# Patient Record
Sex: Female | Born: 1985 | Race: White | Hispanic: Yes | Smoking: Never smoker
Health system: Southern US, Community
[De-identification: ages and names within clinical notes are randomized; demographics above are authoritative.]

## PROBLEM LIST (undated history)

## (undated) DIAGNOSIS — N946 Dysmenorrhea, unspecified: Secondary | ICD-10-CM

## (undated) DIAGNOSIS — K222 Esophageal obstruction: Secondary | ICD-10-CM

## (undated) DIAGNOSIS — F419 Anxiety disorder, unspecified: Secondary | ICD-10-CM

## (undated) DIAGNOSIS — K5289 Other specified noninfective gastroenteritis and colitis: Principal | ICD-10-CM

## (undated) DIAGNOSIS — L259 Unspecified contact dermatitis, unspecified cause: Secondary | ICD-10-CM

## (undated) DIAGNOSIS — J328 Other chronic sinusitis: Secondary | ICD-10-CM

## (undated) DIAGNOSIS — B019 Varicella without complication: Secondary | ICD-10-CM

## (undated) DIAGNOSIS — J45909 Unspecified asthma, uncomplicated: Secondary | ICD-10-CM

## (undated) DIAGNOSIS — E119 Type 2 diabetes mellitus without complications: Secondary | ICD-10-CM

## (undated) DIAGNOSIS — K589 Irritable bowel syndrome without diarrhea: Secondary | ICD-10-CM

## (undated) DIAGNOSIS — E785 Hyperlipidemia, unspecified: Secondary | ICD-10-CM

## (undated) DIAGNOSIS — R1319 Other dysphagia: Secondary | ICD-10-CM

## (undated) DIAGNOSIS — T7840XA Allergy, unspecified, initial encounter: Secondary | ICD-10-CM

## (undated) DIAGNOSIS — R7989 Other specified abnormal findings of blood chemistry: Secondary | ICD-10-CM

## (undated) DIAGNOSIS — H019 Unspecified inflammation of eyelid: Secondary | ICD-10-CM

## (undated) DIAGNOSIS — K7689 Other specified diseases of liver: Secondary | ICD-10-CM

## (undated) DIAGNOSIS — K219 Gastro-esophageal reflux disease without esophagitis: Secondary | ICD-10-CM

## (undated) HISTORY — DX: Other dysphagia: R13.19

## (undated) HISTORY — DX: Irritable bowel syndrome, unspecified: K58.9

## (undated) HISTORY — DX: Allergy, unspecified, initial encounter: T78.40XA

## (undated) HISTORY — DX: Unspecified asthma, uncomplicated: J45.909

## (undated) HISTORY — DX: Other specified abnormal findings of blood chemistry: R79.89

## (undated) HISTORY — DX: Gastro-esophageal reflux disease without esophagitis: K21.9

## (undated) HISTORY — DX: Unspecified inflammation of eyelid: H01.9

## (undated) HISTORY — DX: Hyperlipidemia, unspecified: E78.5

## (undated) HISTORY — DX: Other chronic sinusitis: J32.8

## (undated) HISTORY — DX: Other specified diseases of liver: K76.89

## (undated) HISTORY — DX: Other specified noninfective gastroenteritis and colitis: K52.89

## (undated) HISTORY — DX: Type 2 diabetes mellitus without complications: E11.9

## (undated) HISTORY — DX: Anxiety disorder, unspecified: F41.9

## (undated) HISTORY — DX: Esophageal obstruction: K22.2

## (undated) HISTORY — DX: Dysmenorrhea, unspecified: N94.6

## (undated) HISTORY — DX: Varicella without complication: B01.9

## (undated) HISTORY — DX: Unspecified contact dermatitis, unspecified cause: L25.9

## (undated) HISTORY — PX: OTHER SURGICAL HISTORY: SHX169

---
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
## 2005-04-25 ENCOUNTER — Other Ambulatory Visit: Admission: RE | Admit: 2005-04-25 | Discharge: 2005-04-25 | Payer: Self-pay | Admitting: *Deleted

## 2005-05-30 ENCOUNTER — Ambulatory Visit: Payer: Self-pay | Admitting: Internal Medicine

## 2005-06-05 ENCOUNTER — Ambulatory Visit: Payer: Self-pay | Admitting: Internal Medicine

## 2005-07-05 ENCOUNTER — Ambulatory Visit: Payer: Self-pay | Admitting: Internal Medicine

## 2005-09-01 ENCOUNTER — Ambulatory Visit: Payer: Self-pay | Admitting: Internal Medicine

## 2005-10-23 ENCOUNTER — Ambulatory Visit: Payer: Self-pay | Admitting: Internal Medicine

## 2005-11-18 ENCOUNTER — Other Ambulatory Visit: Admission: RE | Admit: 2005-11-18 | Discharge: 2005-11-18 | Payer: Self-pay | Admitting: Obstetrics and Gynecology

## 2005-12-23 ENCOUNTER — Ambulatory Visit: Payer: Self-pay | Admitting: Internal Medicine

## 2006-03-11 ENCOUNTER — Ambulatory Visit: Payer: Self-pay | Admitting: Internal Medicine

## 2006-03-26 ENCOUNTER — Emergency Department (HOSPITAL_COMMUNITY): Admission: EM | Admit: 2006-03-26 | Discharge: 2006-03-26 | Payer: Self-pay | Admitting: Emergency Medicine

## 2006-04-07 ENCOUNTER — Ambulatory Visit: Payer: Self-pay | Admitting: Internal Medicine

## 2006-04-07 ENCOUNTER — Inpatient Hospital Stay (HOSPITAL_COMMUNITY): Admission: EM | Admit: 2006-04-07 | Discharge: 2006-04-12 | Payer: Self-pay | Admitting: Emergency Medicine

## 2006-04-17 ENCOUNTER — Ambulatory Visit: Payer: Self-pay | Admitting: Pulmonary Disease

## 2006-04-27 ENCOUNTER — Other Ambulatory Visit: Admission: RE | Admit: 2006-04-27 | Discharge: 2006-04-27 | Payer: Self-pay | Admitting: Obstetrics & Gynecology

## 2006-04-30 ENCOUNTER — Ambulatory Visit: Payer: Self-pay | Admitting: Internal Medicine

## 2006-05-28 ENCOUNTER — Ambulatory Visit: Payer: Self-pay | Admitting: Internal Medicine

## 2006-05-29 ENCOUNTER — Ambulatory Visit: Payer: Self-pay | Admitting: Internal Medicine

## 2006-06-17 ENCOUNTER — Ambulatory Visit: Payer: Self-pay | Admitting: Internal Medicine

## 2006-06-17 ENCOUNTER — Encounter (INDEPENDENT_AMBULATORY_CARE_PROVIDER_SITE_OTHER): Payer: Self-pay | Admitting: *Deleted

## 2006-06-17 ENCOUNTER — Observation Stay (HOSPITAL_COMMUNITY): Admission: EM | Admit: 2006-06-17 | Discharge: 2006-06-18 | Payer: Self-pay | Admitting: Emergency Medicine

## 2006-06-18 ENCOUNTER — Encounter (INDEPENDENT_AMBULATORY_CARE_PROVIDER_SITE_OTHER): Payer: Self-pay | Admitting: *Deleted

## 2006-06-26 ENCOUNTER — Ambulatory Visit: Payer: Self-pay | Admitting: Internal Medicine

## 2006-06-29 ENCOUNTER — Ambulatory Visit: Payer: Self-pay | Admitting: Internal Medicine

## 2006-07-14 ENCOUNTER — Ambulatory Visit: Payer: Self-pay | Admitting: Internal Medicine

## 2006-10-07 ENCOUNTER — Ambulatory Visit: Payer: Self-pay | Admitting: Pulmonary Disease

## 2006-10-22 ENCOUNTER — Ambulatory Visit: Payer: Self-pay | Admitting: Internal Medicine

## 2006-10-22 LAB — CONVERTED CEMR LAB
ALT: 35 units/L (ref 0–40)
Alkaline Phosphatase: 51 units/L (ref 39–117)
Basophils Absolute: 0 10*3/uL (ref 0.0–0.1)
Basophils Relative: 0.4 % (ref 0.0–1.0)
Bilirubin, Direct: 0.1 mg/dL (ref 0.0–0.3)
HCT: 37.8 % (ref 36.0–46.0)
Hemoglobin: 13.1 g/dL (ref 12.0–15.0)
MCHC: 34.6 g/dL (ref 30.0–36.0)
Monocytes Absolute: 0.4 10*3/uL (ref 0.2–0.7)
RBC: 4.25 M/uL (ref 3.87–5.11)
TSH: 2.13 microintl units/mL (ref 0.35–5.50)
Total Protein: 6.7 g/dL (ref 6.0–8.3)

## 2006-10-26 ENCOUNTER — Emergency Department (HOSPITAL_COMMUNITY): Admission: EM | Admit: 2006-10-26 | Discharge: 2006-10-26 | Payer: Self-pay | Admitting: Emergency Medicine

## 2006-12-23 ENCOUNTER — Ambulatory Visit: Payer: Self-pay | Admitting: Internal Medicine

## 2007-01-21 ENCOUNTER — Ambulatory Visit: Payer: Self-pay | Admitting: Internal Medicine

## 2007-02-03 ENCOUNTER — Ambulatory Visit: Payer: Self-pay | Admitting: Internal Medicine

## 2007-04-15 ENCOUNTER — Other Ambulatory Visit: Admission: RE | Admit: 2007-04-15 | Discharge: 2007-04-15 | Payer: Self-pay | Admitting: Obstetrics and Gynecology

## 2007-06-10 ENCOUNTER — Ambulatory Visit: Payer: Self-pay | Admitting: Internal Medicine

## 2007-06-26 ENCOUNTER — Ambulatory Visit: Payer: Self-pay | Admitting: Family Medicine

## 2007-07-23 ENCOUNTER — Ambulatory Visit: Payer: Self-pay | Admitting: Internal Medicine

## 2007-08-05 ENCOUNTER — Ambulatory Visit: Payer: Self-pay | Admitting: Internal Medicine

## 2007-08-10 ENCOUNTER — Ambulatory Visit: Payer: Self-pay | Admitting: Cardiology

## 2007-09-02 ENCOUNTER — Ambulatory Visit: Payer: Self-pay | Admitting: Pulmonary Disease

## 2007-09-19 ENCOUNTER — Emergency Department (HOSPITAL_COMMUNITY): Admission: EM | Admit: 2007-09-19 | Discharge: 2007-09-19 | Payer: Self-pay | Admitting: Emergency Medicine

## 2007-09-20 ENCOUNTER — Ambulatory Visit: Payer: Self-pay | Admitting: Internal Medicine

## 2007-09-23 ENCOUNTER — Ambulatory Visit: Payer: Self-pay | Admitting: Cardiology

## 2007-09-23 HISTORY — PX: CT SINUS LTD W/O CM: HXRAD914

## 2007-10-29 ENCOUNTER — Other Ambulatory Visit: Admission: RE | Admit: 2007-10-29 | Discharge: 2007-10-29 | Payer: Self-pay | Admitting: Obstetrics and Gynecology

## 2007-11-19 ENCOUNTER — Encounter: Payer: Self-pay | Admitting: Internal Medicine

## 2007-11-19 DIAGNOSIS — J45998 Other asthma: Secondary | ICD-10-CM | POA: Insufficient documentation

## 2007-11-19 DIAGNOSIS — L259 Unspecified contact dermatitis, unspecified cause: Secondary | ICD-10-CM | POA: Insufficient documentation

## 2007-11-19 DIAGNOSIS — N946 Dysmenorrhea, unspecified: Secondary | ICD-10-CM

## 2007-11-19 DIAGNOSIS — J45909 Unspecified asthma, uncomplicated: Secondary | ICD-10-CM

## 2007-11-19 DIAGNOSIS — B019 Varicella without complication: Secondary | ICD-10-CM

## 2007-11-19 HISTORY — DX: Unspecified asthma, uncomplicated: J45.909

## 2007-11-19 HISTORY — DX: Dysmenorrhea, unspecified: N94.6

## 2007-11-19 HISTORY — DX: Varicella without complication: B01.9

## 2007-11-19 HISTORY — DX: Unspecified contact dermatitis, unspecified cause: L25.9

## 2007-11-20 ENCOUNTER — Ambulatory Visit: Payer: Self-pay | Admitting: Family Medicine

## 2007-11-26 ENCOUNTER — Telehealth (INDEPENDENT_AMBULATORY_CARE_PROVIDER_SITE_OTHER): Payer: Self-pay | Admitting: *Deleted

## 2007-12-09 ENCOUNTER — Ambulatory Visit: Payer: Self-pay | Admitting: Internal Medicine

## 2007-12-09 DIAGNOSIS — J328 Other chronic sinusitis: Secondary | ICD-10-CM | POA: Insufficient documentation

## 2007-12-09 HISTORY — DX: Other chronic sinusitis: J32.8

## 2007-12-29 ENCOUNTER — Encounter: Payer: Self-pay | Admitting: Internal Medicine

## 2008-01-08 ENCOUNTER — Ambulatory Visit: Payer: Self-pay | Admitting: Family Medicine

## 2008-01-13 ENCOUNTER — Ambulatory Visit: Payer: Self-pay | Admitting: Internal Medicine

## 2008-01-13 ENCOUNTER — Encounter: Payer: Self-pay | Admitting: Internal Medicine

## 2008-02-14 ENCOUNTER — Ambulatory Visit: Payer: Self-pay | Admitting: Internal Medicine

## 2008-03-20 ENCOUNTER — Ambulatory Visit: Payer: Self-pay | Admitting: Internal Medicine

## 2008-04-04 ENCOUNTER — Ambulatory Visit: Payer: Self-pay | Admitting: Internal Medicine

## 2008-04-04 ENCOUNTER — Observation Stay (HOSPITAL_COMMUNITY): Admission: AD | Admit: 2008-04-04 | Discharge: 2008-04-05 | Payer: Self-pay | Admitting: Internal Medicine

## 2008-04-04 ENCOUNTER — Encounter (INDEPENDENT_AMBULATORY_CARE_PROVIDER_SITE_OTHER): Payer: Self-pay | Admitting: *Deleted

## 2008-04-05 ENCOUNTER — Encounter (INDEPENDENT_AMBULATORY_CARE_PROVIDER_SITE_OTHER): Payer: Self-pay | Admitting: *Deleted

## 2008-04-11 ENCOUNTER — Ambulatory Visit: Payer: Self-pay | Admitting: Internal Medicine

## 2008-04-17 ENCOUNTER — Ambulatory Visit: Payer: Self-pay | Admitting: Internal Medicine

## 2008-04-17 ENCOUNTER — Other Ambulatory Visit: Admission: RE | Admit: 2008-04-17 | Discharge: 2008-04-17 | Payer: Self-pay | Admitting: Obstetrics & Gynecology

## 2008-04-17 DIAGNOSIS — K7689 Other specified diseases of liver: Secondary | ICD-10-CM | POA: Insufficient documentation

## 2008-04-17 HISTORY — DX: Other specified diseases of liver: K76.89

## 2008-04-17 LAB — CONVERTED CEMR LAB: Hep B S Ab: POSITIVE — AB

## 2008-04-21 ENCOUNTER — Telehealth (INDEPENDENT_AMBULATORY_CARE_PROVIDER_SITE_OTHER): Payer: Self-pay | Admitting: *Deleted

## 2008-04-24 LAB — CONVERTED CEMR LAB
AST: 50 units/L — ABNORMAL HIGH (ref 0–37)
Amylase: 109 units/L (ref 27–131)
BUN: 9 mg/dL (ref 6–23)
Basophils Absolute: 0 10*3/uL (ref 0.0–0.1)
Bilirubin, Direct: 0.1 mg/dL (ref 0.0–0.3)
Chloride: 105 meq/L (ref 96–112)
HCT: 42.2 % (ref 36.0–46.0)
Lymphocytes Relative: 25.5 % (ref 12.0–46.0)
MCV: 89.9 fL (ref 78.0–100.0)
Monocytes Relative: 5.6 % (ref 3.0–12.0)
Neutro Abs: 6 10*3/uL (ref 1.4–7.7)
Platelets: 341 10*3/uL (ref 150–400)
Potassium: 3.8 meq/L (ref 3.5–5.1)
RBC: 4.69 M/uL (ref 3.87–5.11)
Sodium: 138 meq/L (ref 135–145)
Total Bilirubin: 1.4 mg/dL — ABNORMAL HIGH (ref 0.3–1.2)

## 2008-04-25 ENCOUNTER — Encounter: Payer: Self-pay | Admitting: Internal Medicine

## 2008-04-26 ENCOUNTER — Telehealth: Payer: Self-pay | Admitting: Internal Medicine

## 2008-05-02 ENCOUNTER — Ambulatory Visit: Payer: Self-pay | Admitting: Internal Medicine

## 2008-05-15 ENCOUNTER — Ambulatory Visit: Payer: Self-pay | Admitting: Internal Medicine

## 2008-06-13 ENCOUNTER — Encounter: Payer: Self-pay | Admitting: Internal Medicine

## 2008-06-15 ENCOUNTER — Telehealth: Payer: Self-pay | Admitting: Internal Medicine

## 2008-07-14 ENCOUNTER — Encounter: Payer: Self-pay | Admitting: Internal Medicine

## 2008-08-10 ENCOUNTER — Encounter: Payer: Self-pay | Admitting: Internal Medicine

## 2008-09-01 ENCOUNTER — Ambulatory Visit: Payer: Self-pay | Admitting: Internal Medicine

## 2008-09-04 ENCOUNTER — Telehealth: Payer: Self-pay | Admitting: Internal Medicine

## 2008-09-06 ENCOUNTER — Telehealth: Payer: Self-pay | Admitting: Internal Medicine

## 2008-09-07 ENCOUNTER — Ambulatory Visit: Payer: Self-pay | Admitting: Internal Medicine

## 2008-11-15 ENCOUNTER — Ambulatory Visit: Payer: Self-pay | Admitting: Internal Medicine

## 2009-03-03 ENCOUNTER — Ambulatory Visit: Payer: Self-pay | Admitting: Family Medicine

## 2009-06-25 ENCOUNTER — Ambulatory Visit: Payer: Self-pay | Admitting: Internal Medicine

## 2009-06-25 IMAGING — CT CT PARANASAL SINUSES LIMITED
1 of 2 series · 16 of 25 positions shown, 20 images · non-contrast
Comparison: none

CLINICAL DATA: Patient complains of recurrent sinusitis.  Frontal pain.
 LIMITED CT OF PARANASAL SINUSES:
TECHNIQUE: Limited coronal CT images were obtained through the paranasal sinuses without intravenous contrast.

[Series 4: ltd sinus 3.0 h30s · axial · 0.29mm/px · z∈[-93,+5]mm · 16 of 24 slices shown, 20 images]
[im 2/24  brain]
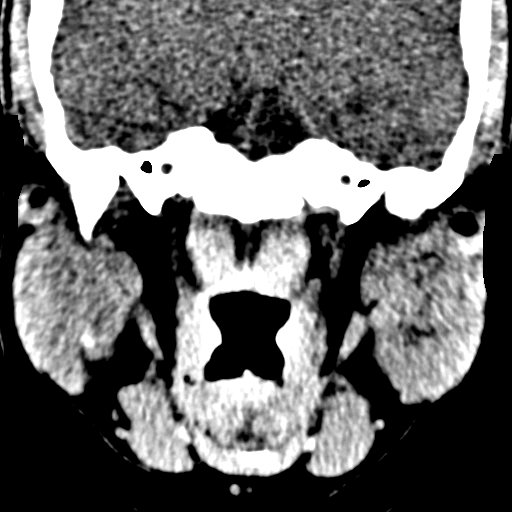
[im 2/24  bone]
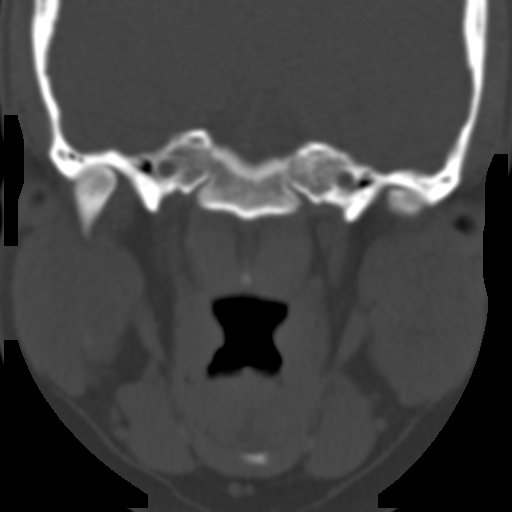
[im 4/24  bone]
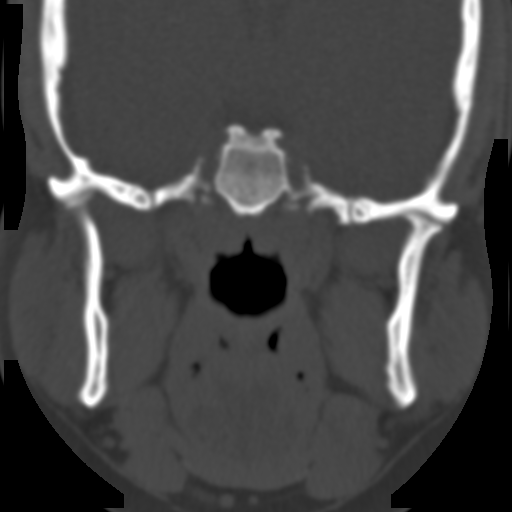
[im 5/24  bone]
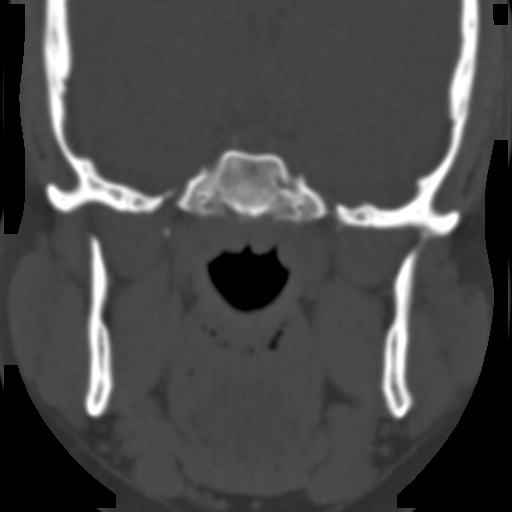
[im 6/24  bone]
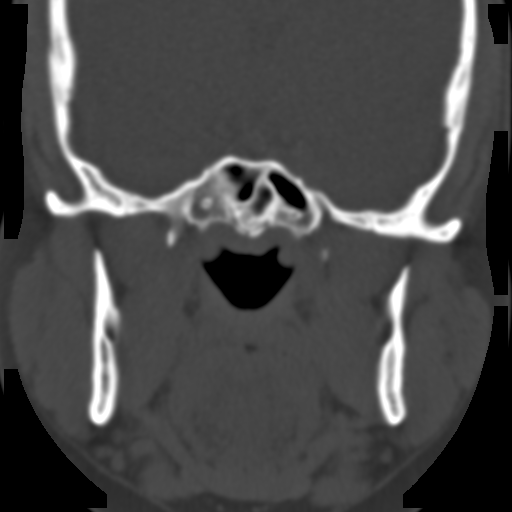
[im 8/24  brain]
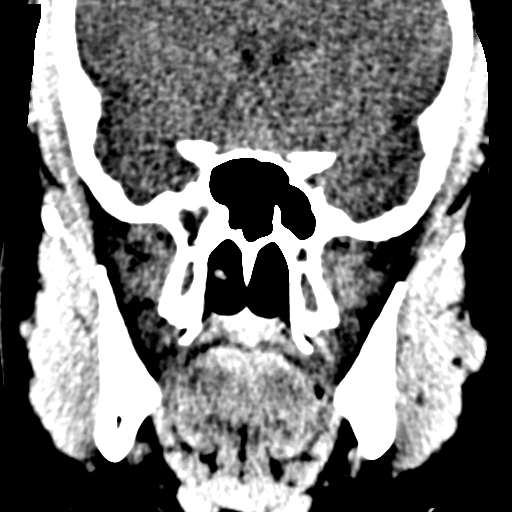
[im 8/24  bone]
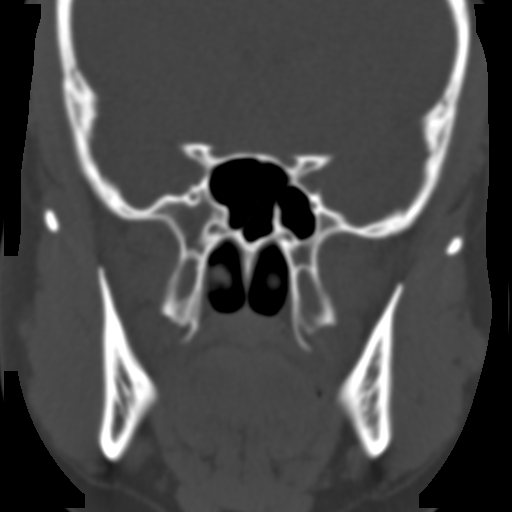
[im 9/24  bone]
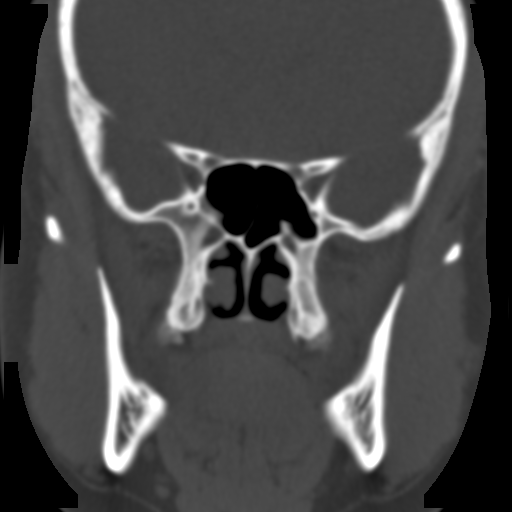
[im 10/24  bone]
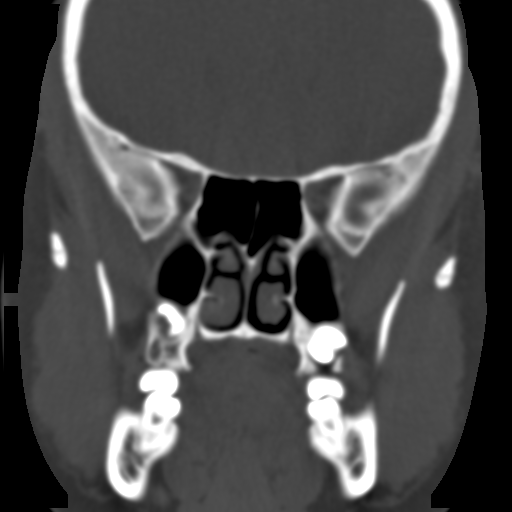
[im 12/24  bone]
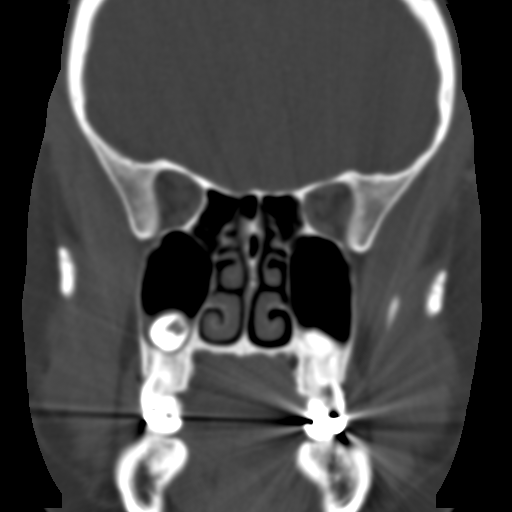
[im 13/24  brain]
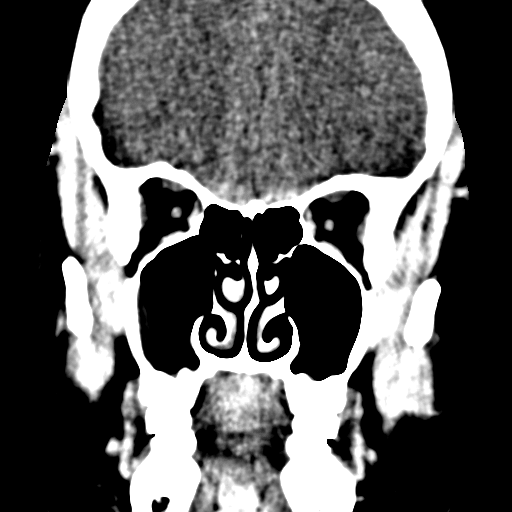
[im 13/24  bone]
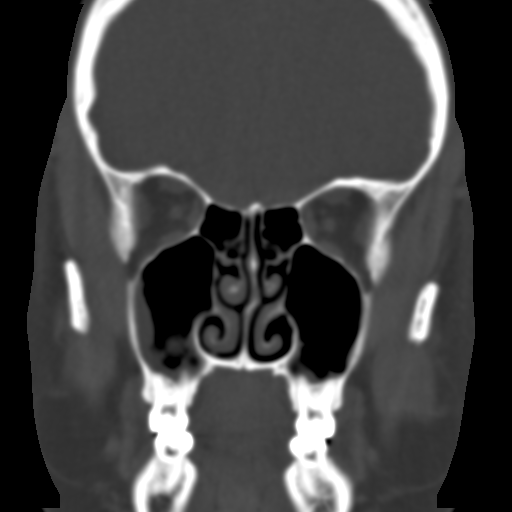
[im 15/24  bone]
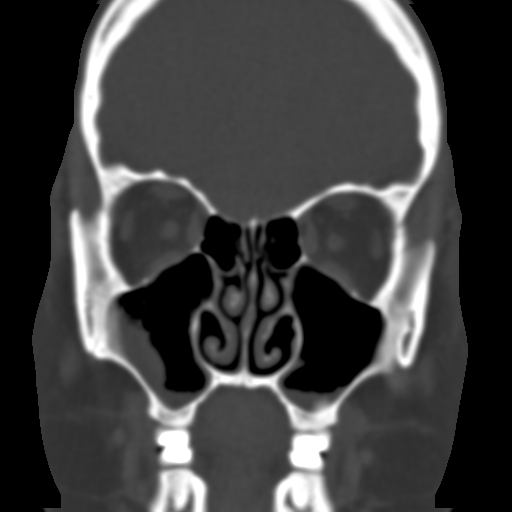
[im 16/24  bone]
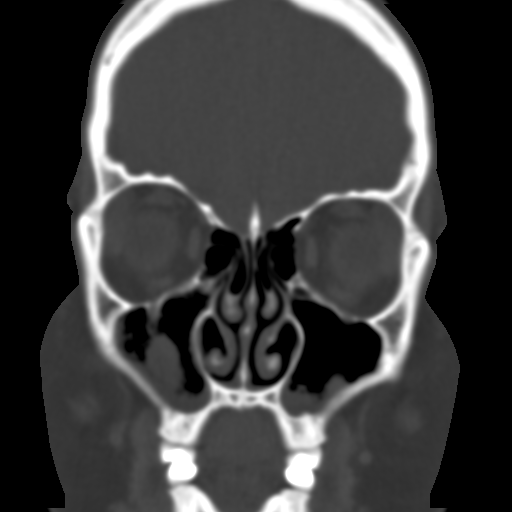
[im 17/24  bone]
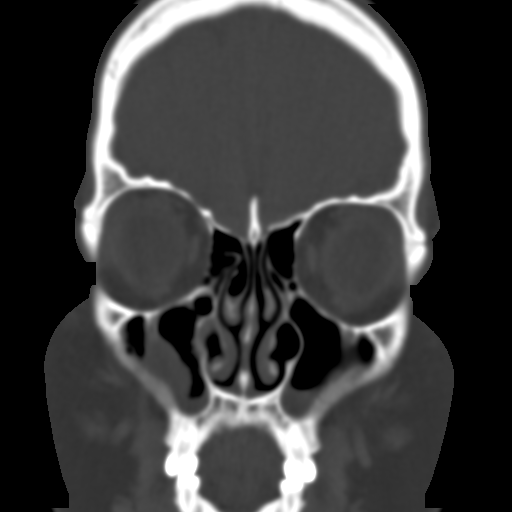
[im 19/24  brain]
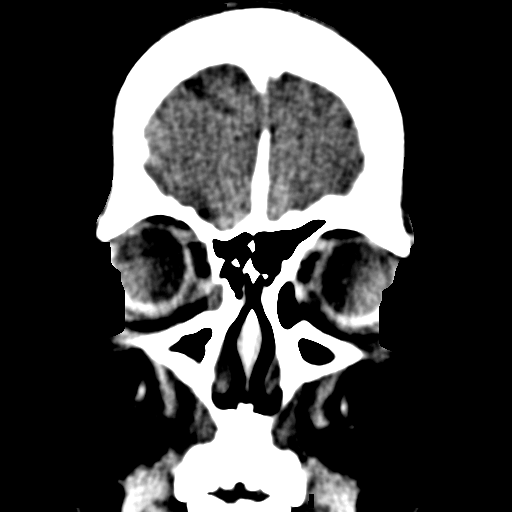
[im 19/24  bone]
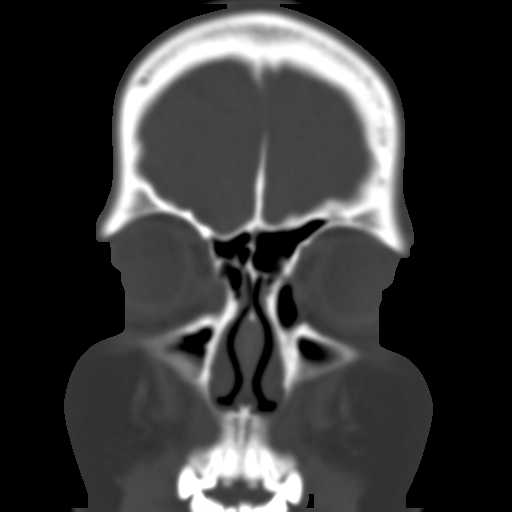
[im 20/24  bone]
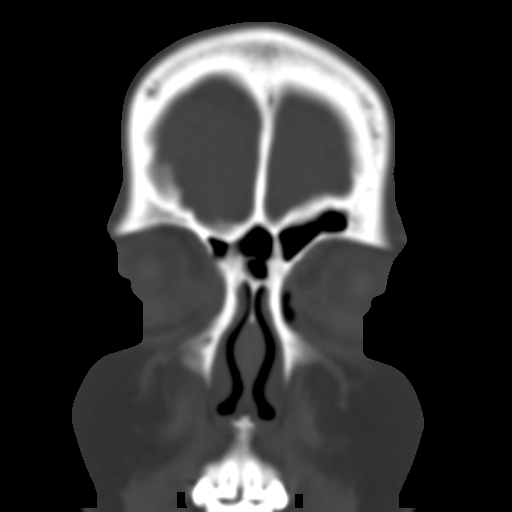
[im 21/24  bone]
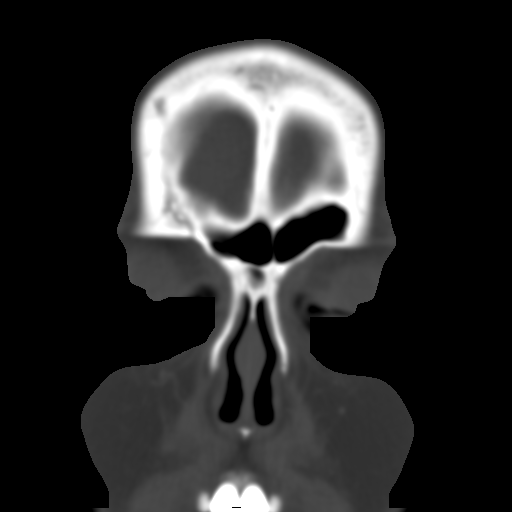
[im 23/24  bone]
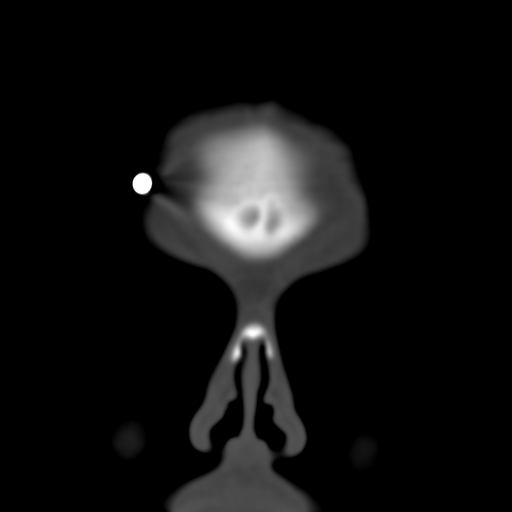

[16 of 25 positions shown; findings below may reference images not displayed]

FINDINGS: There is fluid seen within the maxillary sinuses bilaterally with small area of fluid levels.  There is also mucosal thickening noted bilaterally (right greater than left).  Sphenoid, ethmoid and frontal sinuses appear normally aerated.  There are no areas of bone destruction.
IMPRESSION: Findings are most consistent with bilateral maxillary sinusitis, as discussed above.

## 2009-06-29 LAB — CONVERTED CEMR LAB: Pap Smear: NORMAL

## 2009-07-23 ENCOUNTER — Ambulatory Visit: Payer: Self-pay | Admitting: Internal Medicine

## 2009-08-22 ENCOUNTER — Encounter: Payer: Self-pay | Admitting: Internal Medicine

## 2009-09-25 ENCOUNTER — Ambulatory Visit: Payer: Self-pay | Admitting: Internal Medicine

## 2009-10-27 ENCOUNTER — Ambulatory Visit: Payer: Self-pay | Admitting: Internal Medicine

## 2010-01-21 ENCOUNTER — Ambulatory Visit: Payer: Self-pay | Admitting: Internal Medicine

## 2010-01-22 ENCOUNTER — Telehealth: Payer: Self-pay | Admitting: Internal Medicine

## 2010-04-29 ENCOUNTER — Ambulatory Visit: Payer: Self-pay | Admitting: Internal Medicine

## 2010-06-27 ENCOUNTER — Ambulatory Visit: Payer: Self-pay | Admitting: Diagnostic Radiology

## 2010-06-28 ENCOUNTER — Ambulatory Visit: Payer: Self-pay | Admitting: Internal Medicine

## 2010-10-17 ENCOUNTER — Emergency Department (HOSPITAL_BASED_OUTPATIENT_CLINIC_OR_DEPARTMENT_OTHER): Admission: EM | Admit: 2010-10-17 | Discharge: 2010-06-27 | Payer: Self-pay | Admitting: Emergency Medicine

## 2010-11-16 ENCOUNTER — Ambulatory Visit
Admission: RE | Admit: 2010-11-16 | Discharge: 2010-11-16 | Payer: Self-pay | Source: Home / Self Care | Attending: Family Medicine | Admitting: Family Medicine

## 2010-11-18 ENCOUNTER — Ambulatory Visit
Admission: RE | Admit: 2010-11-18 | Discharge: 2010-11-18 | Payer: Self-pay | Source: Home / Self Care | Attending: Internal Medicine | Admitting: Internal Medicine

## 2010-11-19 ENCOUNTER — Telehealth: Payer: Self-pay | Admitting: Internal Medicine

## 2010-11-19 ENCOUNTER — Ambulatory Visit
Admission: RE | Admit: 2010-11-19 | Discharge: 2010-11-19 | Payer: Self-pay | Source: Home / Self Care | Attending: Gastroenterology | Admitting: Gastroenterology

## 2010-11-19 ENCOUNTER — Encounter: Payer: Self-pay | Admitting: Internal Medicine

## 2010-11-19 DIAGNOSIS — R1319 Other dysphagia: Secondary | ICD-10-CM

## 2010-11-19 HISTORY — DX: Other dysphagia: R13.19

## 2010-11-20 ENCOUNTER — Ambulatory Visit
Admission: RE | Admit: 2010-11-20 | Discharge: 2010-11-20 | Payer: Self-pay | Source: Home / Self Care | Attending: Internal Medicine | Admitting: Internal Medicine

## 2010-11-20 ENCOUNTER — Other Ambulatory Visit: Payer: Self-pay | Admitting: Internal Medicine

## 2010-11-20 DIAGNOSIS — K219 Gastro-esophageal reflux disease without esophagitis: Secondary | ICD-10-CM | POA: Insufficient documentation

## 2010-11-20 HISTORY — DX: Gastro-esophageal reflux disease without esophagitis: K21.9

## 2010-11-21 LAB — HELICOBACTER PYLORI SCREEN-BIOPSY: UREASE: NEGATIVE

## 2010-12-08 LAB — CONVERTED CEMR LAB
Eosinophils Relative: 7.2 % — ABNORMAL HIGH (ref 0.0–5.0)
HCT: 43.4 % (ref 36.0–46.0)
IgE (Immunoglobulin E), Serum: 8318.5 intl units/mL — ABNORMAL HIGH (ref 0.0–180.0)
Lymphocytes Relative: 26.3 % (ref 12.0–46.0)
MCV: 89.8 fL (ref 78.0–100.0)
Monocytes Absolute: 0.4 10*3/uL (ref 0.2–0.7)
Monocytes Relative: 4.8 % (ref 3.0–11.0)
Neutro Abs: 5.7 10*3/uL (ref 1.4–7.7)
Neutrophils Relative %: 61.1 % (ref 43.0–77.0)
Platelets: 280 10*3/uL (ref 150–400)
RBC: 4.83 M/uL (ref 3.87–5.11)

## 2010-12-10 NOTE — Assessment & Plan Note (Signed)
Summary: sore throat/fluid ears/cd   Vital Signs:  Patient profile:   25 year old female Height:      63 inches Weight:      160 pounds O2 Sat:      96 % on Room air Temp:     98.8 degrees F oral Pulse rate:   92 / minute Pulse rhythm:   regular BP sitting:   110 / 70  (left arm) Cuff size:   large  Vitals Entered By: Rock Nephew CMA (April 29, 2010 4:35 PM)  O2 Flow:  Room air CC: sore throat w/ bilateral ear pain and pressure Is Patient Diabetic? No   Primary Care Provider:  Illene Regulus, MD  CC:  sore throat w/ bilateral ear pain and pressure.  History of Present Illness: 3 day h/o sore throat and sore ears. No documented fever. She has sick co-workers at [employer]. The pain continue in the deep aspect of her throat. No chills, no hearing loss, no sinus pain or pressure.   Allergies (verified): No Known Drug Allergies  Past History:  Past Medical History: Last updated: 10/27/2009 Asthma Pneumonias as small child. 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    Physical Exam  General:  alert, well-developed, and well-nourished.   Head:  normocephalic and atraumatic.   Eyes:  corneas and lenses clear and no injection.   Ears:  TMs rosy, right more than left Mouth:  tonisls normal without exudate, posterior pharynx normal.  Neck:  supple and full ROM.   Lungs:  normal respiratory effort, normal breath sounds, no crackles, and no wheezes.     Impression & Recommendations:  Problem # 1:  ACUTE SEROUS OTITIS MEDIA (ICD-381.01) Mild changes on physical exam but a  history of significant upper respiratory infections.  Plan - augmentin 875 two times a day x 7  Complete Medication List: 1)  Proair Hfa 108 (90 Base) Mcg/act Aers (Albuterol sulfate) .... 2 puffs four times a day as needed 2)  Nuvaring 0.12-0.015 Mg/24hr Ring (Etonogestrel-ethinyl estradiol) .... Use as directed 3)  Symbicort 160-4.5 Mcg/act Aero (Budesonide-formoterol fumarate) .... Inhale 2 puffs two times a day 4)  Allegra-d 12 Hour 60-120 Mg Xr12h-tab (Fexofenadine-pseudoephedrine) .... Take 1 by mouth two times a day 5)  Sinus Wash Neti Pot 2300-700 Mg Kit (Sodium chloride-sodium bicarb) .... Once weekly 6)  Fluticasone Propionate 50 Mcg/act Susp (Fluticasone propionate) .Marland Kitchen.. 1 spray per nostril once daily 7)  Amoxicillin-pot Clavulanate 875-125 Mg Tabs (Amoxicillin-pot clavulanate) .Marland Kitchen.. 1 by mouth two times a day x 7 Prescriptions: AMOXICILLIN-POT CLAVULANATE 875-125 MG TABS (AMOXICILLIN-POT CLAVULANATE) 1 by mouth two times a day x 7  #14 x 0   Entered and Authorized by:   Jacques Navy MD   Signed by:   Jacques Navy MD on 04/29/2010   Method used:   [facility] to        [facility] [phone][facility] (retail)       [address]       [place], [place]  [address]       Ph:  [phone]       Fax: [phone]   RxID:   [id number]

## 2010-12-10 NOTE — Progress Notes (Signed)
Summary: not any better  Phone Note Call from Patient Call back at [phone]   Caller: Patient Call For: Brenda Rosales Summary of Call: yesterdau dr Arvell Pulsifer gave her an antibiotic, he told her to call if she got worse. she had to leave work today because she is running a fever. she would like to talk to dr Hanny Elsberry or his nurse about this.  Initial call taken by: Valinda Hoar,  January 22, 2010 1:01 PM  Follow-up for Phone Call        Advised pt that it takes 48-72 hours for abx to start taking effect and that it was not uncommon to feel worse during that time, but that she should start seeing improvent after that time period. If not to call. Carron Curie CMA  January 22, 2010 3:07 PM

## 2010-12-10 NOTE — Assessment & Plan Note (Signed)
Summary: rov 6 months///kp   Primary Provider/Referring Provider:  Illene Regulus, MD  CC:  Follow up visit-cough-productive brown and runny nose and sore throat.Marland Kitchen  History of Present Illness: 11/15/08- Rhinosinusitis, coonjunctivitis, eczema, asthma No asthma, cough or lower respiratory problems. Had an otitis earlier this Fall. Likes Neti pot for as needed. Stuffy nose with indoor heat- not bad. Admits snoring. Discussed. Had seasonal flu vax, discussed H1N1.    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 She had a sinusitis in August responsive to Augmentin, but little asthma . Eczema is flaring some on her left arm. Not acutely ill today.Neti pot does help.  January 21, 2010- Rhinitis, conjunctivitis, eczema, asthma Sinusitis x 2 treated augmentin, keflex successfully. Now acutely ill with 3 days of progressive head congestion, maxillary pressure, brownish. Dry cough has started without wheeze but with substernal tussive soreness. About to take class on field trip. Neti pot does help, but not used in over a week.    Current Medications (verified): 1)  Proair Hfa 108 (90 Base) Mcg/act Aers (Albuterol Sulfate) .... 2 Puffs Four Times A Day As Needed 2)  Nuvaring 0.12-0.015 Mg/24hr  Ring (Etonogestrel-Ethinyl Estradiol) .... Use As Directed 3)  Symbicort 160-4.5 Mcg/act  Aero (Budesonide-Formoterol Fumarate) .... Inhale 2 Puffs Two Times A Day 4)  Allegra-D 12 Hour 60-120 Mg Xr12h-Tab (Fexofenadine-Pseudoephedrine) .... Take 1 By Mouth Two Times A Day 5)  Sinus Wash Neti Pot 2300-700 Mg Kit (Sodium Chloride-Sodium Bicarb) .... Once Weekly 6)  Fluticasone Propionate 50 Mcg/act Susp (Fluticasone Propionate) .Marland Kitchen.. 1 Spray Per Nostril Once Daily  Allergies (verified): No Known Drug Allergies  Past History:  Past Medical History: Last updated: 10/27/2009 Asthma Pneumonias as small  child. 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 Patient adopted    Social History: Last updated: 10/27/2009 Adopted from [facility] [facility] Patient never smoked.  Daily Caffeine Use 1 drink/day Illicit Drug Use - no Works as a Development worker, community at the [employer]   Risk Factors: Smoking Status: never (10/27/2009)  Review of Systems      See HPI  The patient denies anorexia, fever, weight loss, weight gain, vision loss, decreased hearing, hoarseness, syncope, dyspnea on exertion, peripheral edema, headaches, hemoptysis, abdominal pain, and severe indigestion/heartburn.    Vital Signs:  Patient profile:   25 year old female Height:      63 inches Weight:      167.13 pounds BMI:     29.71 O2 Sat:      99 % on Room air Pulse rate:   89 / minute BP sitting:   128 / 82  (left arm) Cuff size:   regular  Vitals Entered By: Reynaldo Minium CMA (January 21, 2010 4:02 PM)  O2 Flow:  Room air  Physical Exam  Additional Exam:  General: A/Ox3; pleasant and cooperative, NAD, SKIN: no rash, lesions NODES: no lymphadenopathy HEENT: Elsie/AT, EOM- WNL, Conjuctivae- clear, PERRLA, TM-WNL, Right canal a little redder, Nose- crusitng, Throat- clear and wnl, .Mallampati III, tonsils prominent without exudate. NECK: Supple w/ fair ROM, JVD- none, normal carotid impulses w/o bruits Thyroid- CHEST: Clear to P&A, very faint crackle with inspiration right lower back, unlabored without cough HEART: RRR, no  m/g/r heard ABDOMEN: Soft and nl;  YQI:HKVQ, nl pulses, no edema  NEURO: Grossly intact to observation      Impression & Recommendations:  Problem # 1:  SINUSITIS- ACUTE-NOS (ICD-461.9)  Recurrent sinusitis, but it seems that she clears  substantially between episodes. We will give keflex again and encourage Neti pot. The following medications were removed from the medication list:    Nasacort Aq 55 Mcg/act Aers (Triamcinolone acetonide(nasal)) .Marland Kitchen... 2 sprays each nostril two times a day    Keflex 500 Mg Caps (Cephalexin) .Marland Kitchen... 1 by mouth two times a day Her updated medication list for this problem includes:    Allegra-d 12 Hour 60-120 Mg Xr12h-tab (Fexofenadine-pseudoephedrine) .Marland Kitchen... Take 1 by mouth two times a day    Sinus Wash Neti Pot 2300-700 Mg Kit (Sodium chloride-sodium bicarb) ..... Once weekly    Fluticasone Propionate 50 Mcg/act Susp (Fluticasone propionate) .Marland Kitchen... 1 spray per nostril once daily    Cephalexin 500 Mg Caps (Cephalexin) .Marland Kitchen... 1 two times a day x 7 days  Problem # 2:  ASTHMA (ICD-493.90) She has remained controlled this winter despite her sinus infections and her exposure to small children. We will continue present meds, but give prednisone taper to carry during her field trip.  Medications Added to Medication List This Visit: 1)  Proair Hfa 108 (90 Base) Mcg/act Aers (Albuterol sulfate) .... 2 puffs four times a day as needed 2)  Prednisone 10 Mg Tabs (Prednisone) .Marland Kitchen.. 1 tab four times daily x 2 days, 3 times daily x 2 days, 2 times daily x 2 days, 1 time daily x 2 days 3)  Cephalexin 500 Mg Caps (Cephalexin) .Marland Kitchen.. 1 two times a day x 7 days  Other Orders: Est. Patient Level II (25956)  Patient Instructions: 1)  Please schedule a follow-up appointment in 6 months. 2)  Script for prednisone to hold in case needed 3)  Script for generic keflex sent to your drug store. Prescriptions: CEPHALEXIN 500 MG CAPS (CEPHALEXIN) 1 two times a day x 7 days  #14 x 0   Entered and Authorized by:   Waymon Budge MD   Signed by:   Waymon Budge MD on 01/21/2010   Method used:   Print then Give to Patient   RxID:   [id number] PREDNISONE 10 MG TABS (PREDNISONE) 1 tab four times daily x 2 days, 3 times  daily x 2 days, 2 times daily x 2 days, 1 time daily x 2 days  #20 x 0   Entered and Authorized by:   Waymon Budge MD   Signed by:   Waymon Budge MD on 01/21/2010   Method used:   Print then Give to Patient   RxID:   [id number]    Immunization History:  Influenza Immunization History:    Influenza:  historical (08/19/2009)

## 2010-12-10 NOTE — Assessment & Plan Note (Signed)
Summary: stitch falling out/cd   Vital Signs:  Patient profile:   25 year old female Height:      63 inches Weight:      159 pounds BMI:     28.27 O2 Sat:      99 % on Room air Temp:     98.4 degrees F oral Pulse rate:   76 / minute BP sitting:   108 / 72  (left arm) Cuff size:   regular  Vitals Entered By: Bill Salinas CMA (June 28, 2010 2:21 PM)  O2 Flow:  Room air CC: pt here to have her finger looked at. she cut it on a knife and thinks her stiches may be coming undone/ ab   Primary Care Provider:  Illene Regulus, MD  CC:  pt here to have her finger looked at. she cut it on a knife and thinks her stiches may be coming undone/ ab.  History of Present Illness: [place] cut her distal left third finger while cooking. She went to [facility] [address] where the wound was sutured closed. She presents for a wound check with a concern that one of the stitches is coming loose. She is oding OK, not much pain.   Current Medications (verified): 1)  Proair Hfa 108 (90 Base) Mcg/act Aers (Albuterol Sulfate) .... 2 Puffs Four Times A Day As Needed 2)  Nuvaring 0.12-0.015 Mg/24hr  Ring (Etonogestrel-Ethinyl Estradiol) .... Use As Directed 3)  Symbicort 160-4.5 Mcg/act  Aero (Budesonide-Formoterol Fumarate) .... Inhale 2 Puffs Two Times A Day 4)  Allegra-D 12 Hour 60-120 Mg Xr12h-Tab (Fexofenadine-Pseudoephedrine) .... Take 1 By Mouth Two Times A Day 5)  Sinus Wash Neti Pot 2300-700 Mg Kit (Sodium Chloride-Sodium Bicarb) .... Once Weekly 6)  Fluticasone Propionate 50 Mcg/act Susp (Fluticasone Propionate) .Marland Kitchen.. 1 Spray Per Nostril Once Daily  Allergies (verified): No Known Drug Allergies PMH-FH-SH reviewed-no changes except otherwise noted  Review of Systems  The patient denies fever, dyspnea on exertion, prolonged cough, suspicious skin lesions, abnormal bleeding, and enlarged lymph nodes.    Physical Exam  General:  Well-developed,well-nourished,in no acute distress; alert,appropriate  and cooperative throughout examination Lungs:  normal respiratory effort.   Heart:  normal rate and regular rhythm.   Skin:  laceration distal aspect of the third digit left. Three sutures in place. Wound edges well approximated. No erythema, swelling, drainage.   Impression & Recommendations:  Problem # 1:  OPEN WOUND FINGER WITHOUT MENTION COMPLICATION (ICD-883.0) Wound -check: repair looks OK without sign of infection. Distal suture with poor bite but not coming out. No revision needed , nor steri-strips.  Plan - return for suture removal in  5-7 days.  Complete Medication List: 1)  Proair Hfa 108 (90 Base) Mcg/act Aers (Albuterol sulfate) .... 2 puffs four times a day as needed 2)  Nuvaring 0.12-0.015 Mg/24hr Ring (Etonogestrel-ethinyl estradiol) .... Use as directed 3)  Symbicort 160-4.5 Mcg/act Aero (Budesonide-formoterol fumarate) .... Inhale 2 puffs two times a day 4)  Allegra-d 12 Hour 60-120 Mg Xr12h-tab (Fexofenadine-pseudoephedrine) .... Take 1 by mouth two times a day 5)  Sinus Wash Neti Pot 2300-700 Mg Kit (Sodium chloride-sodium bicarb) .... Once weekly 6)  Fluticasone Propionate 50 Mcg/act Susp (Fluticasone propionate) .Marland Kitchen.. 1 spray per nostril once daily

## 2010-12-12 NOTE — Assessment & Plan Note (Signed)
Summary: SINUS INFECTION/NWS   Vital Signs:  Patient profile:   25 year old female Height:      63 inches Weight:      165 pounds BMI:     29.33 Temp:     98.2 degrees F oral Pulse rate:   67 / minute BP sitting:   116 / 82  (left arm) Cuff size:   regular  Vitals Entered By: Payton Spark CMA (November 16, 2010 10:52 AM) CC: Sinus infection x 1 week. Mucinex not helping   Primary Care Provider:  Illene Regulus, MD  CC:  Sinus infection x 1 week. Mucinex not helping.  History of Present Illness: 25 y/o ns fem w chronic AR/asthma w a flare x 1 week ?????trigger...on allgd two times a day,symbc. 2 puffs two times a day and sterod ns.....Marland Kitchenalb mdi as needed   also...Marland KitchenMarland Kitchenby the way.......4 weeks h/o of sensation of food getting stuck in  her uppeer eso.......has had to vomit a coupleof time to rel. symptoms...........no prev. h/o of reflux  Current Medications (verified): 1)  Proair Hfa 108 (90 Base) Mcg/act Aers (Albuterol Sulfate) .... 2 Puffs Four Times A Day As Needed 2)  Nuvaring 0.12-0.015 Mg/24hr  Ring (Etonogestrel-Ethinyl Estradiol) .... Use As Directed 3)  Symbicort 160-4.5 Mcg/act  Aero (Budesonide-Formoterol Fumarate) .... Inhale 2 Puffs Two Times A Day 4)  Allegra-D 12 Hour 60-120 Mg Xr12h-Tab (Fexofenadine-Pseudoephedrine) .... Take 1 By Mouth Two Times A Day 5)  Sinus Wash Neti Pot 2300-700 Mg Kit (Sodium Chloride-Sodium Bicarb) .... Once Weekly 6)  Fluticasone Propionate 50 Mcg/act Susp (Fluticasone Propionate) .Marland Kitchen.. 1 Spray Per Nostril Once Daily  Allergies (verified): No Known Drug Allergies  Past History:  Past medical, surgical, family and social histories (including risk factors) reviewed for relevance to current acute and chronic problems.  Past Medical History: Reviewed history from 10/27/2009 and no changes required. Asthma Pneumonias as small child. 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 Mole excisions x4 CT sinus (09/23/2007)    Family History: Reviewed history from 10/27/2009 and no changes required. unknown- adopted. Patient adopted    Social History: Reviewed history from 10/27/2009 and no changes required. Adopted from [place] [facility] Patient never smoked.  Daily Caffeine Use 1 drink/day Illicit Drug Use - no Works as a Development worker, community at [employer]   Review of Systems      See HPI  Physical Exam  General:  Well-developed,well-nourished,in no acute distress; alert,appropriate and cooperative throughout examination Head:  Normocephalic and atraumatic without obvious abnormalities. No apparent alopecia or balding. Eyes:  No corneal or conjunctival inflammation noted. EOMI. Perrla. Funduscopic exam benign, without hemorrhages, exudates or papilledema. Vision grossly normal. Ears:  External ear exam shows no significant lesions or deformities.  Otoscopic examination reveals clear canals, tympanic membranes are intact bilaterally without bulging, retraction, inflammation or discharge. Hearing is grossly normal bilaterally. Nose:  External nasal examination shows no deformity or inflammation. Nasal mucosa are pink and moist without lesions or exudates. Mouth:  Oral mucosa and oropharynx without lesions or exudates.  Teeth in good repair. Neck:  No deformities, masses, or tenderness noted. Chest Wall:  No deformities, masses, or tenderness noted. Lungs:  sym bs..bilat wheezing.mild   Problems:  Medical Problems Added: 1)  Dx of Dysphagia  (EAV-409.81)  Impression & Recommendations:  Problem #  1:  DYSPHAGIA (ICD-787.20) Assessment New  Problem # 2:  ASTHMA (ICD-493.90) Assessment: Deteriorated  Her updated medication list for this problem includes:    Proair Hfa 108 (90 Base) Mcg/act Aers (Albuterol sulfate) .Marland Kitchen... 2 puffs  four times a day as needed    Symbicort 160-4.5 Mcg/act Aero (Budesonide-formoterol fumarate) ..... Inhale 2 puffs two times a day    Prednisone 20 Mg Tabs (Prednisone) ..... Uad  Complete Medication List: 1)  Proair Hfa 108 (90 Base) Mcg/act Aers (Albuterol sulfate) .... 2 puffs four times a day as needed 2)  Nuvaring 0.12-0.015 Mg/24hr Ring (Etonogestrel-ethinyl estradiol) .... Use as directed 3)  Symbicort 160-4.5 Mcg/act Aero (Budesonide-formoterol fumarate) .... Inhale 2 puffs two times a day 4)  Allegra-d 12 Hour 60-120 Mg Xr12h-tab (Fexofenadine-pseudoephedrine) .... Take 1 by mouth two times a day 5)  Sinus Wash Neti Pot 2300-700 Mg Kit (Sodium chloride-sodium bicarb) .... Once weekly 6)  Fluticasone Propionate 50 Mcg/act Susp (Fluticasone propionate) .Marland Kitchen.. 1 spray per nostril once daily 7)  Prednisone 20 Mg Tabs (Prednisone) .... Uad  Patient Instructions: 1)  drinks lots of water 2)  soft diet 3)  pred. 3 now,then 2 qam 4)  cont allg meds  5)  albuterol 2 puffs 3 x day 6)  see pcp next week for eval of esoph. Prescriptions: PREDNISONE 20 MG TABS (PREDNISONE) UAD  #40 x 1   Entered and Authorized by:   Roderick Pee MD   Signed by:   Roderick Pee MD on 11/16/2010   Method used:   [facility]        [facility] [phone][facility] (retail)       [address]       [place], [place]  [address]       Ph: [phone]       Fax: [phone]   RxID:   [id number]    Orders Added: 1)  Est. Patient Level IV [24401]

## 2010-12-12 NOTE — Assessment & Plan Note (Signed)
Summary: PER PT FU---STC   Vital Signs:  Patient profile:   25 year old female Height:      63 inches Weight:      168 pounds BMI:     29.87 O2 Sat:      96 % on Room air Temp:     98.5 degrees F oral Pulse rate:   72 / minute BP sitting:   112 / 72  (left arm) Cuff size:   regular  Vitals Entered By: Bill Salinas CMA (November 18, 2010 4:40 PM)  O2 Flow:  Room air CC: pt here for evaluation of dysphagia. Pt states she has to vomitt to get food out of her throat. she noticed symptoms back in the summer but symptoms have gotten worse over the past two weeks/ ab   Primary Care Provider:  Illene Regulus, MD  CC:  pt here for evaluation of dysphagia. Pt states she has to vomitt to get food out of her throat. she noticed symptoms back in the summer but symptoms have gotten worse over the past two weeks/ ab.  History of Present Illness: Patient was seen in Saturday clinic by Dr. Tawanna Cooler for asthma exacerbation. She was given a breathing treatment and started on steroids. She reports that she did not know that she was wheezing and has had good respiratory function. Today she feels that she is doing fine.  She did report that she has dysphagia for both soids and liquids. She has not had pain or discomfort to suggest GERD. Discussed with her the possibility of silent reflux that could still lead to an esophageal stricture causing dysphagia as well as exacerbating her asthma.   Current Medications (verified): 1)  Proair Hfa 108 (90 Base) Mcg/act Aers (Albuterol Sulfate) .... 2 Puffs Four Times A Day As Needed 2)  Nuvaring 0.12-0.015 Mg/24hr  Ring (Etonogestrel-Ethinyl Estradiol) .... Use As Directed 3)  Symbicort 160-4.5 Mcg/act  Aero (Budesonide-Formoterol Fumarate) .... Inhale 2 Puffs Two Times A Day 4)  Allegra-D 12 Hour 60-120 Mg Xr12h-Tab (Fexofenadine-Pseudoephedrine) .... Take 1 By Mouth Two Times A Day 5)  Sinus Wash Neti Pot 2300-700 Mg Kit (Sodium Chloride-Sodium Bicarb) .... Once  Weekly 6)  Fluticasone Propionate 50 Mcg/act Susp (Fluticasone Propionate) .Marland Kitchen.. 1 Spray Per Nostril Once Daily 7)  Prednisone 20 Mg Tabs (Prednisone) .... Uad  Allergies (verified): No Known Drug Allergies  Past History:  Past Medical History: Last updated: 10/27/2009 Asthma Pneumonias as small child. 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 Lives at home with her parents Patient never smoked.  Daily Caffeine Use 1 drink/day Illicit Drug Use - no    Review of Systems  The patient denies anorexia, fever, weight loss, weight gain, hoarseness, dyspnea on exertion, prolonged cough, abdominal pain, severe indigestion/heartburn, depression, and enlarged lymph nodes.    Physical Exam  General:  moderately overweight woman in no distress Head:  normocephalic and atraumatic.   Eyes:  C&S clear Neck:  supple.   Chest Wall:  no deformities.   Lungs:  normal respiratory effort, no intercostal retractions, no accessory muscle use, normal breath sounds, no crackles, and no wheezes.   Heart:  normal rate and regular rhythm.  Abdomen:  soft, non-tender, and normal bowel sounds.     Impression & Recommendations:  Problem # 1:  ASTHMA (ICD-493.90) Patient is doing well with no respiratory distress or wheezing.   Plan - continue present medications.           complete prednisone taper - see pt instr  Her updated medication list for this problem includes:    Proair Hfa 108 (90 Base) Mcg/act Aers (Albuterol sulfate) .Marland Kitchen... 2 puffs four times a day as needed    Symbicort 160-4.5 Mcg/act Aero (Budesonide-formoterol fumarate) ..... Inhale 2 puffs two times a day     Prednisone 20 Mg Tabs (Prednisone) ..... Uad  Problem # 2:  DYSPHAGIA (EAV-409.81) Patient with solid food dysphagia and sometime liquid. Suspect silent reflux with stricture.  Plan - start PPI therapy            referral to GI  Complete Medication List: 1)  Proair Hfa 108 (90 Base) Mcg/act Aers (Albuterol sulfate) .... 2 puffs four times a day as needed 2)  Nuvaring 0.12-0.015 Mg/24hr Ring (Etonogestrel-ethinyl estradiol) .... Use as directed 3)  Symbicort 160-4.5 Mcg/act Aero (Budesonide-formoterol fumarate) .... Inhale 2 puffs two times a day 4)  Allegra-d 12 Hour 60-120 Mg Xr12h-tab (Fexofenadine-pseudoephedrine) .... Take 1 by mouth two times a day 5)  Sinus Wash Neti Pot 2300-700 Mg Kit (Sodium chloride-sodium bicarb) .... Once weekly 6)  Fluticasone Propionate 50 Mcg/act Susp (Fluticasone propionate) .Marland Kitchen.. 1 spray per nostril once daily 7)  Prednisone 20 Mg Tabs (Prednisone) .... Uad  Other Orders: Gastroenterology Referral (GI)  Patient Instructions: 1)  Asthma - take 20mg  one more day, then 10mg  (1/2 tablet) daily x 5 and stop. Will renew inhalers 2)  Dysphagia - swallow difficulty may be silent reflux with an esophageal stricture. Plan - start prilosec 20mg  every morning. Will refer you to GI for consultation and probably upper endoscopy. Prescriptions: FLUTICASONE PROPIONATE 50 MCG/ACT SUSP (FLUTICASONE PROPIONATE) 1 spray per nostril once daily  #1 x 12   Entered and Authorized by:   Jacques Navy MD   Signed by:   Jacques Navy MD on 11/18/2010   Method used:   [facility] to        [facility] [phone]* (retail)       [address]       [place], [place]  [address]       Ph: [phone]       Fax: [phone]   RxID:   [id number] SYMBICORT 160-4.5 MCG/ACT  AERO (BUDESONIDE-FORMOTEROL FUMARATE) Inhale 2 puffs two times a day  #1 x 12   Entered and Authorized by:   Jacques Navy MD   Signed by:   Jacques Navy MD on  11/18/2010   Method used:   [facility]ronically to        [facility] [phone]* (retail)       [address]       [place], [place]  [address]       Ph: [phone]       Fax: [phone]   RxID:   [id number] PROAIR HFA 108 (90 BASE) MCG/ACT AERS (ALBUTEROL SULFATE) 2 puffs four times a day as needed  #1 inhaler x 12   Entered and Authorized by:   Jacques Navy MD   Signed by:   Jacques Navy MD on 11/18/2010   Method  used:   [facility] to        [facility] [phone]* (retail)       [address]       [place], [place]  [address]       Ph: [phone]       Fax: [phone]   RxID:   [id number]    Orders Added: 1)  Gastroenterology Referral [GI] 2)  Est. Patient Level III [phone]

## 2010-12-12 NOTE — Progress Notes (Signed)
Summary: triage  Phone Note From Other Clinic Call back at [phone]   Caller: Debra from Dr Debby Bud  Call For: Dr Marina Goodell Reason for Call: Schedule Patient Appt Summary of Call: Dr Debby Bud wants this patient seen asap for liquid and solid dysphagia with emesis Initial call taken by: Tawni Levy,  November 19, 2010 9:03 AM  Follow-up for Phone Call        Stanton Kidney will call pt. to see if she can be seen by extender today.   Teryl Lucy RN  November 19, 2010 10:12 AM Pam informed that Stanton Kidney has not got a hold of pt.but she will keep trying. Follow-up by: Teryl Lucy RN,  November 19, 2010 2:20 PM

## 2010-12-12 NOTE — Assessment & Plan Note (Signed)
Summary: dysphagia with emesis   History of Present Illness Visit Type: Initial Consult Primary GI MD: Yancey Flemings MD Primary Provider: Illene Regulus, MD Requesting Provider: Illene Regulus, MD Chief Complaint: Dysphagia with solids and liquids and vomiting started this summer but has been getting worse. Pt was told to begin Prilosec OTC yesterday but has not started it  History of Present Illness:   Patient last saw Dr. Marina Goodell in 2009 for evaluation of abdominal pain, nausea, vomiting and a CTscan suggesting distal small bowel inflammatory changes. A follow up colonoscopy was normal. She hasn't had any episodes since that time.  Patient is here with her father today for evaluation of dysphagia which started over the summer. Steak, chicken, and vegetables tend get stuck in her esophagus and cause chest pain. Sometimes liquids are difficult to pass.  Frequently induces regurgitation to remove lodged food.  No odynophagia.  No history of GERD. No abdominal pain. Her weight is stable.   GI Review of Systems    Reports dysphagia with liquids, dysphagia with solids, and  vomiting.      Denies abdominal pain, acid reflux, belching, bloating, chest pain, heartburn, loss of appetite, nausea, vomiting blood, weight loss, and  weight gain.        Denies anal fissure, black tarry stools, change in bowel habit, constipation, diarrhea, diverticulosis, fecal incontinence, heme positive stool, hemorrhoids, irritable bowel syndrome, jaundice, light color stool, liver problems, rectal bleeding, and  rectal pain.    Current Medications (verified): 1)  Proair Hfa 108 (90 Base) Mcg/act Aers (Albuterol Sulfate) .... 2 Puffs Four Times A Day As Needed 2)  Nuvaring 0.12-0.015 Mg/24hr  Ring (Etonogestrel-Ethinyl Estradiol) .... Use As Directed 3)  Symbicort 160-4.5 Mcg/act  Aero (Budesonide-Formoterol Fumarate) .... Inhale 2 Puffs Two Times A Day 4)  Allegra-D 12 Hour 60-120 Mg Xr12h-Tab  (Fexofenadine-Pseudoephedrine) .... Take 1 By Mouth Two Times A Day 5)  Sinus Wash Neti Pot 2300-700 Mg Kit (Sodium Chloride-Sodium Bicarb) .... Once Weekly 6)  Fluticasone Propionate 50 Mcg/act Susp (Fluticasone Propionate) .Marland Kitchen.. 1 Spray Per Nostril Once Daily 7)  Prednisone 20 Mg Tabs (Prednisone) .... Uad  Allergies (verified): No Known Drug Allergies  Past History:  Past Medical History: Asthma Pneumonias as small child. 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 Mole excisions x4 CT sinus (09/23/2007)    Family History: Reviewed history from 10/27/2009 and no changes required. Patient adopted    Social History: Reviewed history from 10/27/2009 and no changes required. Adopted from [facility] Patient never smoked.  Daily Caffeine Use 1 drink/day Illicit Drug Use - no Works as a Development worker, community at [employer]   Review of Systems       The patient complains of allergy/sinus.  The patient denies anemia, anxiety-new, arthritis/joint pain, back pain, blood in urine, breast changes/lumps, change in vision, confusion, cough, coughing up blood, depression-new, fainting, fatigue, fever, headaches-new, hearing problems, heart murmur, heart rhythm changes, itching, menstrual pain, muscle pains/cramps, night sweats, nosebleeds, pregnancy symptoms, shortness of breath, skin rash, sleeping problems, sore throat, swelling of feet/legs, swollen lymph glands, thirst - excessive , urination - excessive , urination changes/pain, urine leakage, vision changes, and voice change.    Vital Signs:  Patient profile:   25 year old female Height:      39  inches Weight:      168 pounds BMI:     29.87 BSA:     1.80 Pulse rate:   72 / minute Pulse rhythm:   regular BP sitting:    118 / 64  (left arm) Cuff size:   regular  Vitals Entered By: Ok Anis CMA (November 19, 2010 2:32 PM)  Physical Exam  General:  Well developed, well nourished, no acute distress. Head:  Normocephalic and atraumatic. Eyes:  Conjunctiva pink, no icterus.  Mouth:  No oral lesions. Tongue moist.  Neck:  no obvious masses  Lungs:  Clear throughout to auscultation. Heart:  Regular rate and rhythm; no murmurs, rubs,  or bruits. Abdomen:  Abdomen soft, nontender, nondistended. No obvious masses or hepatomegaly.Normal bowel sounds.  Msk:  Symmetrical with no gross deformities. Normal posture. Extremities:  No palmar erythema, no edema.  Neurologic:  Alert and  oriented x4;  grossly normal neurologically. Skin:  Intact without significant lesions or rashes. Cervical Nodes:  No significant cervical adenopathy. Psych:  Alert and cooperative. Normal mood and affect.   Impression & Recommendations:  Problem # 1:  DYSPHAGIA (IEP-329.51) Assessment New Several month history of dysphagia, mainly to solids. Dysphagia predates course of Prednisone and she uses inhalers on infrequent basis so doubt candidiasis. Other thoughts include esophageal stricture, eosinophilic esophagitis. For further evaluation the patient will be scheduled for an EGD with biopsies/ esophageal dilation ( if indicated).  The risks and benefits of the procedure, as well as alternatives were discussed with the patient and she agrees to proceed.   Orders: EGD SAV (EGD SAV)  Problem # 2:  ASTHMA (ICD-493.90) Assessment: Comment Only Currently taking course of Prednisone.  Patient Instructions: 1)  We have scheduled the Endoscopy with Dr. Marina Goodell on 11-20-2010. 2)  [facility] Patient Information Guide given to patient. 3)  Copy sent to : Illene Regulus, MD 4)  The medication list was reviewed and reconciled.  All changed / newly prescribed medications were explained.  A complete medication list was provided  to the patient / caregiver.

## 2010-12-12 NOTE — Miscellaneous (Signed)
Summary: Omeprazole rx  Clinical Lists Changes  Medications: Added new medication of OMEPRAZOLE 40 MG  CPDR (OMEPRAZOLE) 1 each day 30 minutes before meal - Signed Rx of OMEPRAZOLE 40 MG  CPDR (OMEPRAZOLE) 1 each day 30 minutes before meal;  #30 x 11;  Signed;  Entered by: Karl Bales RN;  Authorized by: Hilarie Fredrickson MD;  Method used: [facility] [phone]*, [address], [place], [place], [place]  [address], Ph: [phone], Fax: [phone]    Prescriptions: OMEPRAZOLE 40 MG  CPDR (OMEPRAZOLE) 1 each day 30 minutes before meal  #30 x 11   Entered by:   Karl Bales RN   Authorized by:   Hilarie Fredrickson MD   Signed by:   Karl Bales RN on 11/20/2010   Method used:   [facility]ronically to        [facility] [phone]* (retail)       [address]       [place], [place]  [address]       Ph: [phone]       Fax: [phone]   RxID:   [id number]

## 2010-12-12 NOTE — Procedures (Addendum)
Summary: Upper Endoscopy  Patient: [name] Note: All result statuses are Final unless otherwise noted.  Tests: (1) Upper Endoscopy (EGD)   EGD Upper Endoscopy       DONE      [facility]     [address]     [place], [place]  [address]           ENDOSCOPY PROCEDURE REPORT           PATIENT:  Brenda Rosales, Brenda Rosales  MR#:  [id number]     BIRTHDATE:  [birth year], 24 yrs. old  GENDER:  female           ENDOSCOPIST:  Wilhemina Bonito. Eda Keys, MD     Referred by:  Office           PROCEDURE DATE:  11/20/2010     PROCEDURE:  EGD with biopsy, [address],     Maloney Dilation of Esophagus 62F     ASA CLASS:  Class I     INDICATIONS:  dysphagia           MEDICATIONS:   Fentanyl 100 mcg IV, Versed 10 mg IV     TOPICAL ANESTHETIC:  Exactacain Spray           DESCRIPTION OF PROCEDURE:   After the risks benefits and     alternatives of the procedure were thoroughly explained, informed     consent was obtained.  [facility] [id number] endoscope was     introduced through the mouth and advanced to the second portion of     the duodenum, without limitations.  The instrument was slowly     withdrawn as the mucosa was fully examined.     <<PROCEDUREIMAGES>>           Erosive Esophagitis was found in the distal esophagus.  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  The stomach     was entered and closely examined. The antrum, angularis, and     lesser curvature were well visualized, including a retroflexed     view of the cardia and fundus. The stomach wall was normally     distensable. The scope passed easily through the pylorus into the     duodenum.  Nonerosive Duodenitis was found in the bulb of the     duodenum. Clo bx taken.   Retroflexed views revealed no     abnormalities.    The scope was then withdrawn from the patient     and the procedure completed.           THERAPY: 52 F MALONEY DILATION W/O HEME OR RESISTANCE. TOLERATED     WELL.        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 Resume     prior diet tomorrow.     2) Anti-reflux regimen to be followed     3) OMEPRAZOLE 40MG ; #30 ., ONE PO QD; 11 REFILLS  _____________________________     Wilhemina Bonito. Eda Keys, MD           CC:  Illene Regulus, M.D.; The Patient           n.     eSIGNED:   Wilhemina Bonito. Eda Keys at 11/20/2010 12:26 PM           Wichmann, Nicholos Johns, [id number]  Note: An exclamation mark (!) indicates a result that was not dispersed into the flowsheet. Document Creation Date: 11/20/2010 12:27 PM _______________________________________________________________________  (1) Order result status: Final Collection or observation date-time: 11/20/2010 12:11 Requested date-time:  Receipt date-time:  Reported date-time:  Referring Physician:   Ordering Physician: Fransico Setters [phone]) Specimen Source:  Source: [name] Order Number: [id number] Lab site:   Appended Document: Upper Endoscopy Pt to call for follow up appt in office for 6 weeks

## 2010-12-12 NOTE — Letter (Signed)
Summary: EGD Instructions  [facility]  [address] [place], [place] [address]   Phone: [phone]  Fax: [phone]       Brenda Rosales    04-12-1986    MRN: [id number]       Procedure Day /Date:11-20-2010     Arrival Time:  9:30 AM      Procedure Time: 10:30 AM     Location of Procedure:                    X     [facility] (4th Floor)  PREPARATION FOR ENDOSCOPY   On 11-20-2010 THE DAY OF THE PROCEDURE:  1.   No solid foods, milk or milk products are allowed after midnight the night before your procedure.  2.   Do not drink anything colored red or purple.  Avoid juices with pulp.  No orange juice.  3.  You may drink clear liquids until 8:30 AM , which is 2 hours before your procedure.                                                                                                CLEAR LIQUIDS INCLUDE: Water Jello Ice Popsicles Tea (sugar ok, no milk/cream) Powdered fruit flavored drinks Coffee (sugar ok, no milk/cream) Gatorade Juice: apple, white grape, white cranberry  Lemonade Clear bullion, consomm, broth Carbonated beverages (any kind) Strained chicken noodle soup Hard Candy     MEDICATION INSTRUCTIONS  Unless otherwise instructed, you should take regular prescription medications with a small sip of water as early as possible the morning of your procedure.         OTHER INSTRUCTIONS  You will need a responsible adult at least 25 years of age to accompany you and drive you home.   This person must remain in the waiting room during your procedure.  Wear loose fitting clothing that is easily removed.  Leave jewelry and other valuables at home.  However, you may wish to bring a book to read or an iPod/MP3 player to listen to music as you wait for your procedure to start.  Remove all body piercing jewelry and leave at home.  Total time from sign-in until discharge is approximately 2-3 hours.  You should go home  directly after your procedure and rest.  You can resume normal activities the day after your procedure.  The day of your procedure you should not:   Drive   Make legal decisions   Operate machinery   Drink alcohol   Return to work  You will receive specific instructions about eating, activities and medications before you leave.    The above instructions have been reviewed and explained to me by   _______________________    I fully understand and can verbalize these instructions _____________________________ Date _________

## 2010-12-12 NOTE — Miscellaneous (Signed)
Summary: clotest  Clinical Lists Changes  Problems: Added new problem of GERD (ICD-530.81) Orders: Added new Test order of TLB-H Pylori Screen Gastric Biopsy (83013-CLOTEST) - Signed 

## 2010-12-30 ENCOUNTER — Ambulatory Visit (INDEPENDENT_AMBULATORY_CARE_PROVIDER_SITE_OTHER): Payer: BC Managed Care – PPO | Admitting: Internal Medicine

## 2010-12-30 ENCOUNTER — Encounter: Payer: Self-pay | Admitting: Internal Medicine

## 2010-12-30 DIAGNOSIS — K2100 Gastro-esophageal reflux disease with esophagitis, without bleeding: Secondary | ICD-10-CM

## 2010-12-30 DIAGNOSIS — K219 Gastro-esophageal reflux disease without esophagitis: Secondary | ICD-10-CM

## 2010-12-30 DIAGNOSIS — K222 Esophageal obstruction: Secondary | ICD-10-CM

## 2011-01-01 NOTE — Discharge Summary (Signed)
Summary: Discharge Summary  NAME:  Brenda Rosales, Brenda Rosales            ACCOUNT NO.:  [id number]      MEDICAL RECORD NO.:  [id number]          PATIENT TYPE:  OBV      LOCATION:  1343                         FACILITY:  [facility]      PHYSICIAN:  Rosalyn Gess. Norins, MD  DATE OF BIRTH:  25-Feb-1986      DATE OF ADMISSION:  04/04/2008   DATE OF DISCHARGE:  04/05/2008                                  DISCHARGE SUMMARY      ADMISSION DIAGNOSIS:  Abdominal pain with nausea, vomiting and also   diarrhea.      CONSULTANTS:  Wilhemina Bonito. Marina Goodell, MD for GI      PROCEDURES:  CT scan of the abdomen and pelvis which revealed the   patient had what appeared to be ileitis versus inflammatory bowel   disease with no evidence of appendicitis.      HISTORY OF PRESENT ILLNESS:  The patient woke on the morning of   admission at 3:00 a.m. from sleep by sharp abdominal pain.  She had a   loose stool and felt better.  She awakened at 7 o'clock with recurrent   abdominal pain and had two more bowel movements.  Following this,  she   had the onset of nausea and vomiting for four episodes of emesis.  She   reports that it was initially coffee ground in appearance.  She reports   her stools were brown.  She has had no out of state travel.  She had no   new restaurants.  She had had no change in her diet.  The patient   reports that the bouts of her discomfort and pain can ranged from mild   to severe points to the epigastric region.  The patient was admitted in   August of 2007 for abdominal pain with nausea and vomiting and final   diagnosis with dehydration and viral gastroenteritis.      Please see H&P for past medical history, family history, social history.      PHYSICAL EXAM:  Well-documented. Significant for abdominal pain and   discomfort to palpation particularly in the right lower quadrant.   The rest of her exam was unremarkable.      HOSPITAL COURSE:  The patient was admitted to hospital.  Initial   laboratory  revealed an elevated white blood count 17,700.  With a   hemoglobin of 16.9 grams, platelet count 268,000.  Chemistry on May 26,   were unremarkable with a BUN 12, creatinine 0.7, glucose was 156.  Liver   functions were mildly elevated with AST of 47, ALT 78, alk phos was   normal at 57, total bili was mildly elevated at 1.3.  Amylase was   normal; lipase was normal.  Albumin was normal at 4.3.  CT scan as   noted.      HOSPITAL COURSE:  With the patient having an elevated white blood count   and abdominal pain and discomfort, she was started on Unasyn.  CT as   noted.  After 12 hours of treatment and several doses  of IV Unasyn, the   patient was feeling better and was able to advance her diet.  She was   seen in consultation by the GI service.  They felt the patient might   have ileitis versus inflammatory bowel disease.  They are also concerned   about her mild elevated liver functions.  Additional laboratory was   drawn including hepatitis C panel, alpha 1 antitrypsin deficiency   ceruloplasmin, ANA, ANA, ASMA, ferritin and iron.  Their suggestion was   that the patient did appear stable and could be discharged to home.  She   will have outpatient followup in the GI service.  The patient scheduled   for June 8.  She also would be scheduled for colonoscopy in 6 to 8 weeks   to make sure there is no evidence of inflammatory bowel disease.  With   the patient being afebrile, with her pain being markedly improved and   with her being able to tolerate a diet, she is felt to be stable and   ready for discharge home.      Examination revealed the patient be afebrile, blood pressure 105/66,   heart rate was 66, respirations were unremarkable and unlabored.   ABDOMEN:  The patient's abdomen was soft. She had positive bowel sounds.   She had minimal tenderness.      DISCHARGE MEDICATIONS:  The patient will resume all of her home   medications..  She will be sent home on Augmentin 875 mg  b.i.d. for an   additional 7 days.  She is to call if she has any problems managing her   antibiotic therapy or if she has recurrent nausea and vomiting.      The patient's condition at time of discharge dictation is stable and   improved.               Rosalyn Gess Norins, MD   Electronically Signed            MEN/MEDQ  D:  04/05/2008  T:  04/05/2008  Job:  [id number]      cc:   Wilhemina Bonito. Marina Goodell, MD   [address]   [place]   [place] [address]

## 2011-01-01 NOTE — Discharge Summary (Signed)
Summary: Discharge Summary

## 2011-01-02 ENCOUNTER — Emergency Department (HOSPITAL_COMMUNITY)
Admission: EM | Admit: 2011-01-02 | Discharge: 2011-01-02 | Disposition: A | Discharge status: Home / Self Care | Payer: BC Managed Care – PPO | Attending: Emergency Medicine | Admitting: Emergency Medicine

## 2011-01-02 ENCOUNTER — Telehealth: Payer: Self-pay | Admitting: Internal Medicine

## 2011-01-02 ENCOUNTER — Emergency Department (HOSPITAL_COMMUNITY): Payer: BC Managed Care – PPO

## 2011-01-02 DIAGNOSIS — R109 Unspecified abdominal pain: Secondary | ICD-10-CM | POA: Insufficient documentation

## 2011-01-02 DIAGNOSIS — K5289 Other specified noninfective gastroenteritis and colitis: Secondary | ICD-10-CM | POA: Insufficient documentation

## 2011-01-02 DIAGNOSIS — M549 Dorsalgia, unspecified: Secondary | ICD-10-CM | POA: Insufficient documentation

## 2011-01-02 LAB — CBC
HCT: 44 % (ref 36.0–46.0)
MCHC: 35.2 g/dL (ref 30.0–36.0)
MCV: 88.2 fL (ref 78.0–100.0)
Platelets: 246 10*3/uL (ref 150–400)
RBC: 4.99 MIL/uL (ref 3.87–5.11)
RDW: 12.2 % (ref 11.5–15.5)

## 2011-01-02 LAB — POCT I-STAT, CHEM 8
Calcium, Ion: 1.15 mmol/L (ref 1.12–1.32)
Chloride: 109 mEq/L (ref 96–112)
Creatinine, Ser: 0.7 mg/dL (ref 0.4–1.2)
Glucose, Bld: 169 mg/dL — ABNORMAL HIGH (ref 70–99)
Potassium: 3.8 mEq/L (ref 3.5–5.1)
TCO2: 21 mmol/L (ref 0–100)

## 2011-01-02 LAB — DIFFERENTIAL
Eosinophils Relative: 1 % (ref 0–5)
Lymphocytes Relative: 11 % — ABNORMAL LOW (ref 12–46)
Lymphs Abs: 1.3 10*3/uL (ref 0.7–4.0)
Monocytes Absolute: 0.6 10*3/uL (ref 0.1–1.0)
Monocytes Relative: 5 % (ref 3–12)

## 2011-01-02 LAB — URINE MICROSCOPIC-ADD ON

## 2011-01-02 LAB — LIPASE, BLOOD: Lipase: 33 U/L (ref 11–59)

## 2011-01-02 LAB — URINALYSIS, ROUTINE W REFLEX MICROSCOPIC
Bilirubin Urine: NEGATIVE
Urine Glucose, Fasting: NEGATIVE mg/dL
pH: 5.5 (ref 5.0–8.0)

## 2011-01-02 MED ORDER — IOHEXOL 300 MG/ML  SOLN: 100.0000 mL | Freq: Once | INTRAMUSCULAR | Status: DC | PRN

## 2011-01-03 LAB — URINE CULTURE: Culture  Setup Time: 201202232208

## 2011-01-07 NOTE — Assessment & Plan Note (Signed)
Summary: EGD /  dilation - follow up    History of Present Illness Visit Type: Follow-up Visit Primary GI MD: Yancey Flemings MD Primary Provider: Illene Regulus, MD  Requesting Provider: na Chief Complaint: F/u from EGD and office visit with Gunnar Fusi. Pt states that she is ok and denies any GI complaints  History of Present Illness:    25 year old female with asthma , GERD complicated by peptic stricture, and fatty liver disease. she presents today for followup. She is accompanied by her father. She was evaluated in the office in January 2012 with dysphasia. on November 20, 2010 she underwent upper endoscopy and was found to have erosive esophagitis with peptic stricture for which she underwent Maloney dilation to 63 [place]. she was placed on omeprazole 40 mg daily and presents now for followup. she also had duodenitis. Testing for Helicobacter pylori was negative. she denies any reflux symptoms. No further problems with dysphagia. Tolerating medication well. father has multiple questions. Answered to his satisfaction.   GI Review of Systems      Denies abdominal pain, acid reflux, belching, bloating, chest pain, dysphagia with liquids, dysphagia with solids, heartburn, loss of appetite, nausea, vomiting, vomiting blood, weight loss, and  weight gain.        Denies anal fissure, black tarry stools, change in bowel habit, constipation, diarrhea, diverticulosis, fecal incontinence, heme positive stool, hemorrhoids, irritable bowel syndrome, jaundice, light color stool, liver problems, rectal bleeding, and  rectal pain.    Current Medications (verified): 1)  Proair Hfa 108 (90 Base) Mcg/act Aers (Albuterol Sulfate) .... 2 Puffs Four Times A Day As Needed 2)  Tri-Sprintec 0.18/0.215/0.25 Mg-35 Mcg Tabs (Norgestim-Eth Estrad Triphasic) .... One Tablet By Mouth Once Daily 3)  Symbicort 160-4.5 Mcg/act  Aero (Budesonide-Formoterol Fumarate) .... Inhale 2 Puffs Two Times A Day 4)  Allegra-D 12 Hour 60-120  Mg Xr12h-Tab (Fexofenadine-Pseudoephedrine) .... Take 1 By Mouth Two Times A Day 5)  Sinus Wash Neti Pot 2300-700 Mg Kit (Sodium Chloride-Sodium Bicarb) .... Once Weekly 6)  Fluticasone Propionate 50 Mcg/act Susp (Fluticasone Propionate) .Marland Kitchen.. 1 Spray Per Nostril Once Daily 7)  Omeprazole 40 Mg  Cpdr (Omeprazole) .Marland Kitchen.. 1 Each Day 30 Minutes Before Meal  Allergies (verified): No Known Drug Allergies  Past History:  Past Medical History: Asthma Pneumonias as small child. 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 Mole excisions x4 CT sinus (09/23/2007)    Family History: Reviewed history from 11/19/2010 and no changes required. Patient adopted    Social History: Reviewed history from 11/18/2010 and no changes required. Adopted from [employer] graduate work - Runner, broadcasting/film/video in [employer] Lives at home with her parents Patient never smoked.  Daily Caffeine Use 1 drink/day Illicit Drug Use - no    Review of Systems  The patient denies allergy/sinus, anemia, anxiety-new, arthritis/joint pain, back pain, blood in urine, breast changes/lumps, change in vision, confusion, cough, coughing up blood, depression-new, fainting, fatigue, fever, headaches-new, hearing problems, heart murmur, heart rhythm changes, itching, menstrual pain, muscle pains/cramps, night sweats, nosebleeds, pregnancy symptoms, shortness of breath, skin rash, sleeping problems, sore throat, swelling of feet/legs, swollen lymph glands, thirst - excessive , urination - excessive , urination changes/pain, urine leakage, vision changes, and voice  change.    Vital Signs:  Patient profile:   25 year old  female Height:      63 inches Weight:      167 pounds BMI:     29.69 BSA:     1.79 Pulse rate:   76 / minute Pulse rhythm:   regular BP sitting:   110 / 64  (left arm) Cuff size:   regular  Vitals Entered By: Ok Anis CMA (December 30, 2010 3:47 PM)  Physical Exam  General:  Well developed, well nourished, no acute distress. Head:  Normocephalic and atraumatic. Eyes:  PERRLA, no icterus. Lungs:  Clear throughout to auscultation. Heart:  Regular rate and rhythm; no murmurs, rubs,  or bruits. Abdomen:  Soft, nontender and nondistended. No masses, hepatosplenomegaly or hernias noted. Normal bowel sounds. Pulses:  Normal pulses noted. Neurologic:   alert margin Psych:  Alert and cooperative. Normal mood and affect.   Impression & Recommendations:  Problem # 1:  GERD (ICD-530.81)  GERD complicated by erosive esophagitis and peptic stricture. Currently asymptomatic post dilation on PPI.  Plan : #1. continue omeprazole daily  #2. Reflux precautions  #3. Routine followup in one year. Sooner for questions or problems.  Patient Instructions: 1)  Please schedule a follow-up appointment in 1 year. 2)  Copy sent to : Illene Regulus, MD  3)  The medication list was reviewed and reconciled.  All changed / newly prescribed medications were explained.  A complete medication list was provided to the patient / caregiver.

## 2011-01-07 NOTE — Progress Notes (Signed)
Summary: Triage: Abdominal Pain/Vomiting  Phone Note Call from Patient Call back at Home Phone [phone] [facility]     Caller: Patient Call For: Dr. Marina Goodell Reason for Call: Talk to Nurse Summary of Call: Nausea and Vomiting with pain since 4a.m. this morning Initial call taken by: Karna Christmas,  January 02, 2011 9:43 AM  Follow-up for Phone Call        Patient states that she has been having abdominal pain above her belly button all across her abdomen. This pain is sharp and it comes and goes but right after it stops she vomits. She has also had some diarrhea. Asked patient is she has been running a fever, she has not taken it but states she is burning up and sweating. She tried some sips of water but threw that up immediately. Dr. Marina Goodell please advise. Follow-up by: Selinda Michaels RN,  January 02, 2011 10:37 AM  Additional Follow-up for Phone Call Additional follow up Details #1::        She should go to ER ASAP for evaluation by EDP. Additional Follow-up by: Hilarie Fredrickson MD,  January 02, 2011 10:45 AM    Additional Follow-up for Phone Call Additional follow up Details #2::    Spoke with patient and let her know Dr. Lamar Sprinkles recommendations. She verbalized understanding. Follow-up by: Selinda Michaels RN,  January 02, 2011 10:57 AM

## 2011-01-23 ENCOUNTER — Ambulatory Visit (INDEPENDENT_AMBULATORY_CARE_PROVIDER_SITE_OTHER): Payer: BC Managed Care – PPO | Admitting: Internal Medicine

## 2011-01-23 ENCOUNTER — Telehealth (INDEPENDENT_AMBULATORY_CARE_PROVIDER_SITE_OTHER): Payer: Self-pay | Admitting: *Deleted

## 2011-01-23 ENCOUNTER — Encounter: Payer: Self-pay | Admitting: Internal Medicine

## 2011-01-23 DIAGNOSIS — H00019 Hordeolum externum unspecified eye, unspecified eyelid: Secondary | ICD-10-CM

## 2011-01-28 NOTE — Assessment & Plan Note (Signed)
Summary: work in per dr norins/clogged tear duct/see note emr/cd--pt a...   Vital Signs:  Patient profile:   25 year old female Height:      63 inches Weight:      163 pounds BMI:     28.98 O2 Sat:      98 % on Room air Temp:     98.7 degrees F oral Pulse rate:   71 / minute BP sitting:   110 / 80  (left arm) Cuff size:   regular  Vitals Entered By: Bill Salinas CMA (January 23, 2011 4:38 PM)  O2 Flow:  Room air  Primary Care Provider:  Illene Regulus, MD    History of Present Illness: Brenda Rosales was seen at an Urgent Care due to swelling of the right upper eyelid. she was diagnosed as having a blocked tear duct and was started on augmentin. She has been applying warm compresses. The swelling has gone down but there is pointing at the out aspect of the eyelid just above the lash line. The area is painful. No fever or chills, no loss of vision.   Current Medications (verified): 1)  Proair Hfa 108 (90 Base) Mcg/act Aers (Albuterol Sulfate) .... 2 Puffs Four Times A Day As Needed 2)  Tri-Sprintec 0.18/0.215/0.25 Mg-35 Mcg Tabs (Norgestim-Eth Estrad Triphasic) .... One Tablet By Mouth Once Daily 3)  Symbicort 160-4.5 Mcg/act  Aero (Budesonide-Formoterol Fumarate) .... Inhale 2 Puffs Two Times A Day 4)  Allegra-D 12 Hour 60-120 Mg Xr12h-Tab (Fexofenadine-Pseudoephedrine) .... Take 1 By Mouth Two Times A Day 5)  Sinus Wash Neti Pot 2300-700 Mg Kit (Sodium Chloride-Sodium Bicarb) .... Once Weekly 6)  Fluticasone Propionate 50 Mcg/act Susp (Fluticasone Propionate) .Marland Kitchen.. 1 Spray Per Nostril Once Daily 7)  Omeprazole 40 Mg  Cpdr (Omeprazole) .Marland Kitchen.. 1 Each Day 30 Minutes Before Meal 8)  Augmentin 875-125 Mg Tabs (Amoxicillin-Pot Clavulanate) .Marland Kitchen.. 1 Two Times A Day X 10 Days  Allergies (verified): No Known Drug Allergies PMH-FH-SH reviewed-no changes except otherwise noted  Review of Systems Eyes:  Complains of discharge; denies blurring, double vision, eye pain, halos, itching, light  sensitivity, red eye, and vision loss-1 eye.  Physical Exam  General:  alert, well-developed, well-nourished, and well-hydrated.   Head:  normocephalic and atraumatic.   Eyes:  right upper eyelid is swollen. There is a small red nodule inner aspect lower lid at the later canthus. There is swelling of the upper eyelid and pointing with white exudate. Lens and cornea clear, bulbar conjunctivia clear  Lungs:  normal respiratory effort.   Heart:  normal rate and regular rhythm.     Impression & Recommendations:  Problem # 1:  HORDEOLUM (ICD-373.11) Patient with external hordeolum. She has no fever, no inflammation of the bulbar conjunctiva.  Plan - OK to complete augmentin (UpToDate does not recommend systemic antibiotics)           warm compresses           for failure to improve by Monday - refer to opthalmology for possible I&D.  Complete Medication List: 1)  Proair Hfa 108 (90 Base) Mcg/act Aers (Albuterol sulfate) .... 2 puffs four times a day as needed 2)  Tri-sprintec 0.18/0.215/0.25 Mg-35 Mcg Tabs (Norgestim-eth estrad triphasic) .... One tablet by mouth once daily 3)  Symbicort 160-4.5 Mcg/act Aero (Budesonide-formoterol fumarate) .... Inhale 2 puffs two times a day 4)  Allegra-d 12 Hour 60-120 Mg Xr12h-tab (Fexofenadine-pseudoephedrine) .... Take 1 by mouth two times a day 5)  Sinus  Wash Neti Pot 2300-700 Mg Kit (Sodium chloride-sodium bicarb) .... Once weekly 6)  Fluticasone Propionate 50 Mcg/act Susp (Fluticasone propionate) .Marland Kitchen.. 1 spray per nostril once daily 7)  Omeprazole 40 Mg Cpdr (Omeprazole) .Marland Kitchen.. 1 each day 30 minutes before meal 8)  Augmentin 875-125 Mg Tabs (Amoxicillin-pot clavulanate) .Marland Kitchen.. 1 two times a day x 10 days   Orders Added: 1)  Est. Patient Level III [16109]

## 2011-01-28 NOTE — Progress Notes (Signed)
Summary: req appt today  Phone Note Call from Patient Call back at Home Phone [phone]   Caller: Patient--[phone] cell Call For: Dr Debby Bud Summary of Call: Pt went to [facility] yesterday for right clogged tearduct. Pt was given Augmentin,pt has now deveoped whitehead on top of eyelid, eye still swollen half way. Advised to f/u today with Dr Debby Bud. Please advise. Initial call taken by: Verdell Face,  January 23, 2011 10:36 AM  Follow-up for Phone Call        Dr Debby Bud ok'd work in slot today, pt coming at [employer] today. Follow-up by: Verdell Face,  January 23, 2011 1:27 PM

## 2011-02-03 ENCOUNTER — Ambulatory Visit (INDEPENDENT_AMBULATORY_CARE_PROVIDER_SITE_OTHER): Payer: BC Managed Care – PPO | Admitting: Internal Medicine

## 2011-02-03 ENCOUNTER — Telehealth: Payer: Self-pay | Admitting: Internal Medicine

## 2011-02-03 VITALS — BP 120/72 | HR 76 | Temp 98.6°F | Wt 165.0 lb

## 2011-02-03 DIAGNOSIS — H00019 Hordeolum externum unspecified eye, unspecified eyelid: Secondary | ICD-10-CM

## 2011-02-03 DIAGNOSIS — H00015 Hordeolum externum left lower eyelid: Secondary | ICD-10-CM

## 2011-02-03 NOTE — Progress Notes (Signed)
  Subjective:    Patient ID: Brenda Rosales, female    DOB: 1986-05-16, 25 y.o.   MRN: [id number]  HPI Calin presents for a new hordeolum - now of the left lower eyelid. She had a previous infection right upper lid. Last Thursday she developed swelling and pain in the left lower lid. She has been applying warm compresses. The swelling continued. This AM she had thick blood discharge spontaneously. She has been taking ibuprofen but no repeat course of antibiotics.   Previous note: History of Present Illness:  [name] was seen at an Urgent Care due to swelling of the right upper eyelid. she was diagnosed as having a blocked tear duct and was started on augmentin. She has been applying warm compresses. The swelling has gone down but there is pointing at the out aspect of the eyelid just above the lash line. The area is painful. No fever or chills, no loss of vision.  Problem # 1:  HORDEOLUM (ICD-373.11) Patient with external hordeolum. She has no fever, no inflammation of the bulbar conjunctiva.  Plan - OK to complete augmentin (UpToDate does not recommend systemic antibiotics)           warm compresses           for failure to improve by Monday - refer to opthalmology for possible I&D.   Past History:  Past Medical History: Last updated: 10/27/2009 Asthma Pneumonias as small child. 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  Negative for fever and chills.  HENT: Positive for rhinorrhea. Negative for facial swelling and sneezing.   Eyes: Positive for photophobia, pain and discharge.       Pain in the lower left eyelid.  Respiratory: Negative.  Negative for cough, chest tightness and shortness of breath.   Cardiovascular: Negative.   Genitourinary: Negative.         Objective:   Physical Exam  Constitutional: She is oriented to person, place, and time. She appears well-developed and well-nourished. No distress.       No acute distress  HENT:  Head: Normocephalic and atraumatic.  Eyes:       Bulbar conjunctiva is clear. Lens clear and normal pupils are noted. Left lower eyelid with erythema, swelling and small point of purulence. Palpebral conjunctiva erythema.   Neck: Normal range of motion. Neck supple.  Cardiovascular: Normal rate and regular rhythm.   Pulmonary/Chest: Effort normal and breath sounds normal.  Neurological: She is alert and oriented to person, place, and time.          Assessment & Plan:  1. Hordeolum - recurrent infection, now in opposite eye that has not responded to conservative therapy over 4 days and has now had spontaneous drainage.  Plan - opthal referral to Dr. Nelle Don at [facility] for Tuesday, March 27th at [id number].

## 2011-02-03 NOTE — Telephone Encounter (Signed)
Ok to come by at her Brenda Rosales - will work her in.

## 2011-02-03 NOTE — Telephone Encounter (Signed)
Pt states that she was in for OV about 1.5 wks ago for a swollen upper eyelid. Pt states that her lower eyelid is now swollen and "bleeding" she states is the best way to describe it.  Pt is requesting an OV to assess today. Please advise.

## 2011-02-03 NOTE — Telephone Encounter (Signed)
Pt already on schedule for today.

## 2011-02-04 ENCOUNTER — Telehealth: Payer: Self-pay | Admitting: *Deleted

## 2011-02-04 NOTE — Telephone Encounter (Signed)
Office note from yesterday faxed to Dr Nelle Don at [phone]

## 2011-02-04 NOTE — Telephone Encounter (Signed)
Message copied by [facility], Toniqua Melamed on Tue Feb 04, 2011  8:15 AM ------      Message from: Illene Regulus E      Created: Mon Feb 03, 2011  4:56 PM       Please print and fax to Dr. Dellia Cloud at [facility] for 0900 appointment

## 2011-03-25 NOTE — Assessment & Plan Note (Signed)
[facility]                                 ON-CALL NOTE   [name], [name]                     MRN:          [id number]  DATE:11/20/2007                            DOB:          Oct 24, 1986    Patient Brenda Rosales; date of birth 1986/02/22; date of interaction  11/20/2007 at 8:04 a.m.   PHONE NUMBER:  [phone]  Caller is Brenda Rosales, the mother.  The patient would like to be seen today.   PRIMARY CARE PHYSICIAN:  Dr. Debby Bud.  Home office is [place].  The patient  was told to come in at 10:15 A.M. to be seen.     Arta Silence, MD  Electronically Signed    RNS/MedQ  DD: 11/20/2007  DT: 11/20/2007  Job #: [id number]

## 2011-03-25 NOTE — Discharge Summary (Signed)
NAME:  Brenda Rosales, Brenda Rosales NO.:  [id number]   MEDICAL RECORD NO.:  [id number]          PATIENT TYPE:  OBV   LOCATION:  1343                         FACILITY:  [facility]   PHYSICIAN:  Rosalyn Gess. Norins, MD  DATE OF BIRTH:  06-17-1986   DATE OF ADMISSION:  04/04/2008  DATE OF DISCHARGE:  04/05/2008                               DISCHARGE SUMMARY   ADMISSION DIAGNOSIS:  Abdominal pain with nausea, vomiting and also  diarrhea.   CONSULTANTS:  Wilhemina Bonito. Marina Goodell, MD for GI   PROCEDURES:  CT scan of the abdomen and pelvis which revealed the  patient had what appeared to be ileitis versus inflammatory bowel  disease with no evidence of appendicitis.   HISTORY OF PRESENT ILLNESS:  The patient woke on the morning of  admission at 3:00 a.m. from sleep by sharp abdominal pain.  She had a  loose stool and felt better.  She awakened at 7 o'clock with recurrent  abdominal pain and had two more bowel movements.  Following this,  she  had the onset of nausea and vomiting for four episodes of emesis.  She  reports that it was initially coffee ground in appearance.  She reports  her stools were brown.  She has had no out of state travel.  She had no  new restaurants.  She had had no change in her diet.  The patient  reports that the bouts of her discomfort and pain can ranged from mild  to severe points to the epigastric region.  The patient was admitted in  August of 2007 for abdominal pain with nausea and vomiting and final  diagnosis with dehydration and viral gastroenteritis.   Please see H&P for past medical history, family history, social history.   PHYSICAL EXAM:  Well-documented. Significant for abdominal pain and  discomfort to palpation particularly in the right lower quadrant.  The rest of her exam was unremarkable.   HOSPITAL COURSE:  The patient was admitted to hospital.  Initial  laboratory revealed an elevated white blood count 17,700.  With a  hemoglobin of 16.9 grams,  platelet count 268,000.  Chemistry on May 26,  were unremarkable with a BUN 12, creatinine 0.7, glucose was 156.  Liver  functions were mildly elevated with AST of 47, ALT 78, alk phos was  normal at 57, total bili was mildly elevated at 1.3.  Amylase was  normal; lipase was normal.  Albumin was normal at 4.3.  CT scan as  noted.   HOSPITAL COURSE:  With the patient having an elevated white blood count  and abdominal pain and discomfort, she was started on Unasyn.  CT as  noted.  After 12 hours of treatment and several doses of IV Unasyn, the  patient was feeling better and was able to advance her diet.  She was  seen in consultation by the GI service.  They felt the patient might  have ileitis versus inflammatory bowel disease.  They are also concerned  about her mild elevated liver functions.  Additional laboratory was  drawn including hepatitis C panel, alpha 1 antitrypsin deficiency  ceruloplasmin, ANA,  ANA, ASMA, ferritin and iron.  Their suggestion was  that the patient did appear stable and could be discharged to home.  She  will have outpatient followup in the GI service.  The patient scheduled  for June 8.  She also would be scheduled for colonoscopy in 6 to 8 weeks  to make sure there is no evidence of inflammatory bowel disease.  With  the patient being afebrile, with her pain being markedly improved and  with her being able to tolerate a diet, she is felt to be stable and  ready for discharge home.   Examination revealed the patient be afebrile, blood pressure 105/66,  heart rate was 66, respirations were unremarkable and unlabored.  ABDOMEN:  The patient's abdomen was soft. She had positive bowel sounds.  She had minimal tenderness.   DISCHARGE MEDICATIONS:  The patient will resume all of her home  medications..  She will be sent home on Augmentin 875 mg b.i.d. for an  additional 7 days.  She is to call if she has any problems managing her  antibiotic therapy or if she  has recurrent nausea and vomiting.   The patient's condition at time of discharge dictation is stable and  improved.      Rosalyn Gess Norins, MD  Electronically Signed     MEN/MEDQ  D:  04/05/2008  T:  04/05/2008  Job:  [id number]   cc:   Wilhemina Bonito. Marina Goodell, MD  [address]  [place]  [place] [address]

## 2011-03-25 NOTE — Assessment & Plan Note (Signed)
[facility]                             PULMONARY OFFICE NOTE   Brenda Rosales, Brenda Rosales                   MRN:          [id number]  DATE:07/23/2007                            DOB:          1986-03-18    PULMONARY ACUTE EXTENDED OFFICE VISIT.   HISTORY:  A 25 year old white female with status asthmaticus documented  a year ago with an IgE level of over 10,000, but marked improvement in  asthma while on Symbicort on her last visit here in February of 2008.  She tells me there were problems acquiring Symbicort through her  insurance, and she was switched over to Advair, and has not done  generally as well with increasing frequency of nasal and chest  congestion with dyspnea, now with exertion, and increased need for  albuterol (she had previously not needed it at all) up to 4 times daily.   She comes in this morning actually having not received any medications  for asthma in over 12 hours.   I reviewed her medications and am not convinced she is actually taking  them the way they are intended.  She does tell me that she gets 100%  improvement after Augmentin and prednisone.   EXAM:  In no acute distress.  VITAL SIGNS:  Stable vital signs.  HEENT:  Reveals moderate nonspecific turbinate edema.  Oropharynx is  clear.  NECK:  Supple without cervical adenopathy or tenderness. Trachea is  midline.  No thyromegaly.  LUNGS:  The lung fields reveal more pseudo-wheeze than true wheeze with  adequate air movement.  CARDIAC:  Regular rate and rhythm without murmur, gallop, or rub.  ABDOMEN:  Soft and benign.  EXTREMITIES:  Warm without calf tenderness, cyanosis, clubbing, or  edema.   IMPRESSION:  Most of her symptoms are upper airway in nature and  associated with discolored sputum, which is certainly suggestive of  sinusitis.  I have recommended Augmentin 875 b.i.d. for 10 days and  reviewed with her optimal treatment for chronic rhinitis and  sinusitis  (the exact same information we went over with her in February of 2007,  which I realized after I reviewed it with her again today because we had  a copy of the old instructions that we had given her).  She acted like  it was new information.  This is a bad sign in terms of self-management.   The recommendation, however, was to use Nasacort 1 to 2 b.i.d. using  Afrin 12 hour nasal spray for 5 days to help the Nasacort reach the  target tissue, while doing 10 days of Augmentin, and another 6 days of  prednisone.   If not 100% better after this, the next step is to check a sinus CT scan  and add PPI empirically, although I do not think reflux is playing a  major role here, it certainly statistically could very well be.   Also, because she seemed to do better on Symbicort than Advair, I have  switched her over to Symbicort 160/4.5 two puffs b.i.d.  I have  emphasized to her that this should eliminate all of her  symptoms with  minimal need for albuterol if we are on the right track.   She is to return in 2 weeks for followup to assess response to therapy.  We will see her sooner if needed.     Charlaine Dalton. Sherene Sires, MD, [facility]  Electronically Signed    MBW/MedQ  DD: 07/24/2007  DT: 07/25/2007  Job #: [id number]   cc:   Rosalyn Gess. Norins, MD

## 2011-03-25 NOTE — Assessment & Plan Note (Signed)
[facility]                                 ON-CALL NOTE   [name], [name]                     MRN:          [id number]  DATE:11/20/2007                            DOB:          1985/12/02    Patient name is Brenda Rosales, date of birth 17-Dec-1985; date of  interaction 11/20/2007 at 8:04 a.m.  Phone number:  [phone]  Caller:  Tomie China, the mother.   OBJECTIVE:  The patient had conjunctivitis approximately one week ago,  was treated, and did well.  Last night she started having some itching  of the eye and this morning woke up with her eye swollen and bothering  her   OBJECTIVE:  Possible conjunctivitis.  Perhaps it might well be a cyst   PLAN:  Have her come in this morning to be seen.  I will see her at  approximately 10:15 A.M.  Primary care Mckinzie Saksa is Dr. Arthur Holms, home  office is [place].     Arta Silence, MD  Electronically Signed    RNS/MedQ  DD: 11/20/2007  DT: 11/20/2007  Job #: [id number]

## 2011-03-25 NOTE — Assessment & Plan Note (Signed)
[facility]                             PULMONARY OFFICE NOTE   Dillan, Lunden LUCELIA LACEY                   MRN:          [id number]  DATE:09/20/2007                            DOB:          [birth year]    This is a pulmonary extended followup office visit.   HISTORY:  A 25 year old white female with refractory symptoms of cough  in the setting of previously evidence of asthma with status asthmaticus  associated marked elevation of IgE levels.  All of this has flared since  July 2008 and the only she was even transiently improved was after a  course of antibiotics.  She says the antibiotic that seem to work the  best was [employer].  However, she has received Omnicef, Augmentin, Avelox,  and Levaquin since then with no sustained improvement despite optimal  treatment directed at rhinitis with Nasacort and Afrin (5-day courses of  Afrin only).  Presently, she comes in with acute pain and swelling in her right eye  for which she has already been seen by urgent care and given eye drops.  She denies any difficulty with vision and had the classic matted eye  this morning when she got up.  It is somewhat better now.   PHYSICAL EXAMINATION:  EYES:  She has obvious conjunctivitis involving  the right eye with no chemosis or proptosis.  There is full range of  motion in her ocular muscles.  HEAD:  She does have a slightly moon-shaped face (but has not been on  prednisone for months).  Oropharynx is clear.  NECK:  Supple without cervical adenopathy or tenderness.  Trachea is  midline.  No thyromegaly.  LUNGS:  Lungs fields completely clear bilaterally on auscultation  percussion.  HEART:  There is a regular rhythm without murmur, gallop, or rub.  ABDOMEN:  Soft, benign.  EXTREMITIES:  Warm without calf tenderness, cyanosis, clubbing, edema.   IMPRESSION:  1. Refractory sinusitis that appears only transiently improved from      antibiotics.  I am going to give  her another 20-day course of      Omnicef while arranging for ENT evaluation as soon as possible.  2. Conjunctivitis.  I have advised the patient that if this condition      worsens to return to the emergency room and would be concerned      about something more sinister like a cavernous venous thrombosis      from her severe sinusitis if not for the fact that she has the      classic findings supporting conjunctivitis including the matting of      her eye this morning as well as the absence of any recent      prednisone exposure and the absence of fever here as well today.      Nevertheless, she will need prompt ears, nose, and throat followup      which we will arrange as soon as possible and also a repeat CT scan      in the meantime as soon as we can arrange it.    Charlaine Dalton. Sherene Sires, MD,  [facility]  Electronically Signed   MBW/MedQ  DD: 09/20/2007  DT: 09/20/2007  Job #: [id number]

## 2011-03-25 NOTE — Assessment & Plan Note (Signed)
[name]                             PULMONARY OFFICE NOTE   Brenda Rosales Brenda Rosales                   MRN:          [id number]  DATE:09/02/2007                            DOB:          1986/08/12    HISTORY OF PRESENT ILLNESS:  The patient is a 25 year old white female  patient of Dr. Thurston Hole who has a history of chronic rhinitis with recent  sinusitis documented on CT scan on 08/10/2007 showing a bilateral  maxillary sinusitis. The patient was treated with a 10 day course of  Levaquin 750. The patient reports that her symptoms did improve.  However, within a week of stopping her Levaquin, her symptoms have  started slowly returning with ear stuffiness, hoarseness, nasal  congestion, sinus pain, and pressure. The patient denies any neck pain,  fever, nausea, vomiting, chest pain, shortness of breath, visual  changes, or headache.   PAST MEDICAL HISTORY:  Reviewed.   CURRENT MEDICATIONS:  Reviewed.   PHYSICAL EXAMINATION:  GENERAL:  The patient is a pleasant female in no  acute distress.  VITAL SIGNS:  Stable and afebrile. O2 saturation is 98% on room air.  HEENT:  Nasal mucosa is erythematous. Maxillary sinus tenderness to  percussion. Tympanic membranes is normal. Posterior pharynx is clear  without exudate.  NECK:  Supple without cervical adenopathy. No JVD. Negative nuchal  rigidity.  LUNGS:  Sounds are clear. Without wheezing or crackles.  CARDIAC:  Regular rate.  ABDOMEN:  Soft and nontender.  EXTREMITIES:  Warm without any edema.   IMPRESSION AND PLAN:  Slow to resolve rhinosinusitis. The patient will  begin as recommended to start a prolonged course of antibiotics with  Augmentin x2 weeks. She will continue on her nasal hygiene regimen with  saline and Nasacort AQ, along with Mucinex twice daily. The patient is  also to use Prilosec daily for any residual reflux that could have the  potential for irritating airways. The patient is to  return back with Dr.  Sherene Sires in two weeks or sooner if needed. If symptoms do not improve, she  may need referral to ENT.      Rubye Oaks, NP  Electronically Signed      Charlaine Dalton. Sherene Sires, MD, [facility]  Electronically Signed   TP/MedQ  DD: 09/02/2007  DT: 09/03/2007  Job #: [id number]

## 2011-03-25 NOTE — Assessment & Plan Note (Signed)
 [facility]                             PULMONARY OFFICE NOTE   Brenda Rosales, Brenda Rosales RESSIE SLEVIN                   MRN:          [id number]  DATE:08/05/2007                            DOB:          [birth year]    HISTORY OF PRESENT ILLNESS:  The patient is a 25 year old white female  patient of Dr. Thurston Hole that was recently seen in the office two weeks ago  for persistent cough, congestion and shortness of breath with increased  albuterol use.  The patient was felt to have some underlying sinusitis.  She was given a 10-day course of Augmentin, prednisone taper and  aggressive nasal hydrating regimen with Nasacort AQ and Afrin.  The  patient returns today and report her symptoms have slightly improved but  have not resolved totally.  She continues to have thick mucous, nasal  congestion, sinus pain and pressure.  The patient denies any hemoptysis,  orthopnea, PND or leg swelling.   PAST MEDICAL HISTORY:  Reviewed.   CURRENT MEDICATIONS:  Reviewed .   PHYSICAL EXAMINATION:  GENERAL:  The patient is a pleasant, obese female  in no acute distress.  She is afebrile with stable vital signs.  Oxygen  saturation is 99% on room air.  HEENT:  Nasal mucosa is erythematous.  Maxillary sinus tenderness.  Posterior pharynx is clear.  NECK:  Supple without cervical adenopathy.  No JVD.  LUNGS:  Clear to auscultation without any wheezes or crackles.  CARDIAC:  Regular rate.  ABDOMEN:  Soft and nontender.  EXTREMITIES:  Warm without any edema.   IMPRESSION/PLAN:  Recent sinusitis with persistent symptoms.  Will check  a CT of the sinuses.  I have recommended that she add Mucinex DM one  tablet twice daily along with nasal hydrating regimen.  Continue nasal  hydrating regimen with saline nasal spray and Nasacort AQ.  The patient  will continue on Symbicort 160/4.5 two puffs twice daily.  Will add in  Zegerid 40 mg at bedtime for any potential reflux that could be  irritating airways.  The patient will return back here in two weeks with  Dr. Sherene Sires or sooner if needed.     Rubye Oaks, NP  Electronically Signed      Charlaine Dalton. Sherene Sires, MD, [facility]  Electronically Signed   TP/MedQ  DD: 08/05/2007  DT: 08/06/2007  Job #: [id number]

## 2011-03-28 NOTE — H&P (Signed)
NAME:  Brenda Rosales, Brenda Rosales NO.:  [id number]   MEDICAL RECORD NO.:  [id number]          PATIENT TYPE:  INP   LOCATION:  0104                         FACILITY:  [facility]   PHYSICIAN:  Georgina Quint. Plotnikov, M.D. LHCDATE OF BIRTH:  Jun 22, 1986   DATE OF ADMISSION:  06/17/2006  DATE OF DISCHARGE:                                HISTORY & PHYSICAL   CHIEF COMPLAINT:  Abdominal pain.   HISTORY OF PRESENT ILLNESS:  The patient is a 25 year old female who has  been sick since Sunday with nausea and vomiting, no more than once a day.  Felt okay on Tuesday.  This morning, started to have intractable nausea with  vomiting at least 6 times and diarrhea over 10 times with clear water in the  end, a lot of abdominal pain that would double her over.  No chills or  fever.  No syncope.  She presented to the ER and received IV Zofran,  Dilaudid and fluids.  We were called to admit the patient.   PAST MEDICAL HISTORY:  1.  Asthma.  2.  Eczema.   MEDICATIONS:  1.  Advair.  2.  Nasacort.  3.  Albuterol p.r.n.  (No recent antibiotics.  Was on high doses of prednisone; discontinued about  a month ago).   ALLERGIES:  None.   SOCIAL HISTORY:  She is a Consulting civil engineer.  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.  Does not smoke or drink.   FAMILY HISTORY:  She is adopted from [place].  Does not smoke or drink  alcohol.   REVIEW OF SYSTEMS:  No previous GI problems.  No weight loss.  Has had  weight gain on prednisone.  No GI or urinary complaints.  The rest is  negative.   PHYSICAL EXAMINATION:  VITAL SIGNS:  Temperature of 96.6 with blood pressure  of 126/83, heart rate 94, respirations 18.  GENERAL:  She is in no acute distress.  Appears tired.  HEENT:  Dry oral mucosa.  NECK:  Supple.  No meningeal signs.  LUNGS:  Clear.  No wheezes or rales.  HEART:  S1 and S2.  Tachycardic.  ABDOMEN:  Soft, sensitive throughout.  No rebound symptoms.  No masses felt.  EXTREMITIES:  Lower extremities  without edema.  Calves nontender.  SKIN:  Clear.  She is alert, oriented and cooperative.   LABORATORY DATA:  Urine pregnancy test negative.  Urinalysis negative with  white count 3-6 cells.  White count 14.4, hemoglobin 15.7, sodium 139,  potassium 3.8, creatinine 0.7, AST 50, ALT 94, total bilirubin 1.7, lipase  21.  CT scan of the abdomen and pelvis with fatty liver infiltration, distal  _________ edema, normal appendix, a small amount of fluid in the cul-de-sac.   ASSESSMENT AND PLAN:  1.  Abdominal pain, vomiting and diarrhea of unknown etiology, likely due to      infectious enteritis or enterocolitis.  Need to right upper Crohn's      disease, hepatitis A, etc.  Will obtain hepatitis A, IgM, GI      consultation tomorrow.  Will manage symptomatically.  2.  Dehydration.  Will use IV fluids.  3.  Elevated liver function tests of unclear etiology.  Likely due to fatty      infiltration of the liver.  Obtain laboratory work.  4.  Asthma with a history of respiratory failure.  Continue current therapy.      Doing well at present.  5.  Stress steroid dose due to recent high-dose steroid use.           ______________________________  Georgina Quint. Plotnikov, M.D. [facility]     AVP/MEDQ  D:  06/17/2006  T:  06/17/2006  Job:  [id number]   cc:   Rosalyn Gess. Norins, M.D. [facility]  [address]  [place]  [place] [address]   Charlaine Dalton. Sherene Sires, M.D. [facility]  [address]  [place]  [place] [address]

## 2011-03-28 NOTE — Assessment & Plan Note (Signed)
[facility]                             PULMONARY OFFICE NOTE   Huberta, Tompkins JAQUASIA DOSCHER                   MRN:          [id number]  DATE:10/22/2006                            DOB:          Dec 01, 1985    HISTORY:  This is a 25 year old white female with a long-standing  history of asthma complicated by status-asthmaticus in May of 2007 with  an IgE level then of greater than 10,000.  She reported that she was  doing very well when I saw her in September, but since then has had  increasing symptoms of coughing, wheezing, and congestion on Advair,  which she presently does at the 250/50 one b.i.d. plus Nasacort 1  nightly.  She attributes the symptoms to nasal congestion she gets  every fall.  She is just coming off of a course of antibiotics and  prednisone on November 28 (see nurse practitioner note November 28).   PHYSICAL EXAMINATION:  She is a slightly cushingoid-appearing ambulatory  female in no acute distress.  VITAL SIGNS:  Stable vital signs.  HEENT:  Moderate turbinate edema.  Oropharynx is clear.  NECK:  Supple without cervical adenopathy or tenderness. Trachea is  midline.  No thyromegaly.  LUNGS:  Fields reveal trace wheezing bilaterally on FVC.  CARDIAC:  Regular rate and rhythm without murmur, gallop, or rub.  ABDOMEN:  Soft and benign.  EXTREMITIES:  Warm without calf tenderness, cyanosis, clubbing, or  edema.   Hemoglobin saturation 98% on room air.   IMPRESSION:  Poorly-controlled asthma with prominent symptoms of  rhinitis.  Her main symptoms are, at this point, more cough than they  are wheezing, and she has not really followed the contingency  instructions I had hoped she would, based on previous recommendations.  I, therefore, spent an extra 15 to 20 minutes of a 25 minute visit going  over with her the following issues.  1. First, I reviewed a chronic rhinitis flare with her, demonstrating      both in text and graphic  format how best to approach the treatment      of chronic rhinitis.  Specifically, I would like her to increase      her Nasacort up to 2 puffs b.i.d. until better, and then taper to 1      puff nightly, using Afrin for the first 5 days to help the Nasacort      reach the target tissue.  2. I am going to give her the option today of switching from Advair to      Symbicort 160/4.5 two puffs b.i.d. to see if she notices      improvement in her symptoms, especially coughing and decrease the      use of her albuterol.  If so, then I would like to switch to      Symbicort.  If not, then I think it is reasonable to continue      Advair, whichever 1 she chooses.   Either way, we need to see her back here every 4 to 6 weeks to make sure  she is maintaining adequate asthma control  based on the severity of her  disease.   A repeat IgE level was recommended today, along with a cbc with  differential, looking for significant eosinophilia and a sinus CT scan  needs to be considered if she is not back to 100% baseline on the above  regimen.     Charlaine Dalton. Sherene Sires, MD, [facility]  Electronically Signed    MBW/MedQ  DD: 10/22/2006  DT: 10/22/2006  Job #: [id number]   cc:   Rosalyn Gess. Norins, MD

## 2011-03-28 NOTE — Letter (Signed)
June 29, 2006     Ms. [facility]  [address]  [place], [place] [address]   RE:  PORSCHIA, WILLBANKS  MRN:  [id number]  /  DOB:  [birth year]   Dr. Ms. Renato Battles,   Thank you for seeing me today.  I admit at the end of the day I was a little  bit rushed in seeing you.   Your laboratory work from June 26, 2006 did show that you had normal  hemoglobin A1C, indicating no problems with blood sugar control.  Your liver  enzymes were mildly elevated, with an SGOT of 86 and an SGPT of 118.  This  is slightly higher than these lab values were when you were in the hospital.  This is suggestive of a possible ongoing mild viral inflammation such as  Epstein-Barr virus or cytomegalovirus.   I am pleased that you are doing so well, and you certainly looked healthy  and your exam was normal.  I believe we should follow up with liver function  studies in three months to make sure that these trend downward.  Should this  be a mild viral inflammation such as CMV or EBV, the treatment is tincture  of time.   If you have any questions about this, please do not hesitate to contact me.   Sincerely,     Rosalyn Gess. Norins, MD   MEN/MedQ  DD:  06/30/2006  DT:  06/30/2006  Job #:  [id number]

## 2011-03-28 NOTE — Assessment & Plan Note (Signed)
[facility]                             PULMONARY OFFICE NOTE   Brenda Rosales, Brenda Rosales                   MRN:          [id number]  DATE:12/23/2006                            DOB:          21-Nov-1985    HISTORY:  A 25 year old female with a history of status asthmaticus with  an IgE level of over 10,000 at the time of her last severe exacerbation  of asthma in May of 2007.  We had seen her here in December with an IgE  level down to the 5000 range and had been doing great on Symbicort  160/4.5 two puffs b.i.d., but comes in complaining of nasal congestion  with ears popping for the last several days.  She denies any sore  throat, purulent sputum, fevers, chills sweats, or dyspnea with  activities or nocturnally, or need for rescue therapy.   She does complain that Symbicort costs too much and her insurance won't  pay for it.   PHYSICAL EXAMINATION:  She is a slightly cushingoid-appearing ambulatory  female in no acute distress.  VITAL SIGNS:  Stable vital signs.  HEENT:  Remarkable for severe nonspecific turbinate edema with watery  discharge only.  Oropharynx is clear with mild cobblestoning, but no  significant erythema or purulent nasal drainage.  NECK:  Supple without cervical adenopathy or tenderness. Trachea is  midline.  No thyromegaly.  LUNGS:  Fields are completely clear bilaterally to auscultation and  percussion with excellent air movement.  CARDIAC:  Regular rate and rhythm without murmur, gallop, or rub.  ABDOMEN:  Soft and benign.  EXTREMITIES:  Warm without calf tenderness, cyanosis, clubbing, or  edema.   IMPRESSION:  1. Chronic rhinitis is the patient's main chronic problem that has not      been addressed adequately.  She has already explicit instructions      on how to manage rhinitis more aggressively by using Afrin plus      nasal steroids for 5 days and has not implemented this strategy      despite increasing nasal  symptoms over the last several days.  I      have advised her to do so and go back and look at the flyer that we      already reviewed on her last visit, and to let us know if she has      purulent sputum or fever, in which case a course of antibiotics      would be considered.  2. Marked elevation in IgE level in a patient with exceptionally well-      controlled asthma.  This is somewhat of a moot issue unless the      rhinitis problem becomes more refractory in which case a formal      allergy evaluation by Dr. Maple Hudson would be appropriate.  3. The asthma component of her problem is under excellent control.      However, because of insurance, she is being forced to switch back      to Advair, which is fine, but note that it previously caused      significant  hoarseness.  I am going to recommend for now the 250/50      one b.i.d. regimen, and if she is well-controlled on this, would      not hesitate to reduce her to 100/50.  I first taught her optimal      MDI and DPI technique, which she mastered to an adequate level, and      emphasized optimal rinsing and gargling after use.   Pulmonary followup can be every 3 months, sooner if needed.     Charlaine Dalton. Sherene Sires, MD, [facility]  Electronically Signed    MBW/MedQ  DD: 12/23/2006  DT: 12/23/2006  Job #: [id number]   cc:   Rosalyn Gess. Norins, MD

## 2011-03-28 NOTE — Discharge Summary (Signed)
NAME:  Brenda Rosales, Brenda Rosales NO.:  [id number]   MEDICAL RECORD NO.:  [id number]          PATIENT TYPE:  INP   LOCATION:  1431                         FACILITY:  [facility]   PHYSICIAN:  Oley Balm. Sung Amabile, M.D. [facility] OF BIRTH:  Dec 31, 1985   DATE OF ADMISSION:  04/07/2006  DATE OF DISCHARGE:  04/12/2006                                 DISCHARGE SUMMARY   ADMISSION DIAGNOSES:  1.  Acute shortness of breath with hypoxemia.  2.  Otitis media.  3.  Tachycardia.  4.  History of eczema.   DISCHARGE DIAGNOSES:  1.  Acute shortness of breath with hypoxemia.  2.  Otitis media.  3.  Tachycardia.  4.  History of eczema.  5.  Status asthmaticus.  6.  Hypoxemia out of proportion to other exam findings, and thought      secondary to mucous plugging.  7.  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   HISTORY OF PRESENT ILLNESS:  Please refer to admission history and physical  for this patient's initial presentation.  Briefly, she is a 25 year old  woman with a history of asthma diagnosed as a child, and previously only  treated with albuterol as needed, though she does report that she has been  prescribed Advair in the past.  She presented to the emergency department  with symptoms of shortness of breath, and initially was admitted to a  regular medical floor, but required transfer to the ICU on the same day of  admission for increasing respiratory distress.  There, she was seen by Dr.  Delford Field in consultation and given the diagnosis of status asthmaticus, right  middle lobe atelectasis versus pneumonia, and bilateral lower lobe airspace  disease.   HOSPITAL COURSE:  She was initially treated with corticosteroids,  antibiotics and bronchodilators.  She was also treated empirically for  gastroesophageal reflux disease.  Due to the mucous plugging and pulmonary  infiltrates, Dr. Sherene Sires, who saw her in follow up for Dr. 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  Her IgE level was  markedly elevated.  Of note, eosinophils were normal.  IgE was greater than  11,000 with an upper limit of normal of 180.  By April 31, 2007, she was  markedly improved, and we began to taper steroids.  She was transferred to  the floor on that day.  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  On the day of discharge, she is markedly  improved and anxious to go home.  There is no respiratory distress at rest.  I did walk her up and down the halls with desaturation to 89% and  tachycardia to 135.  Her discharge is contingent on follow up with Dr. Sherene Sires  at the end of this week.  I have also emphasized that she needs to take it  easy until she gets approval to advance her activity level by Dr. Sherene Sires.   DISCHARGE MEDICATIONS:  1.  Advair 500/50 one inhalation b.i.d.  2.  Albuterol inhaler 2 sprays q.4h. p.r.n.  I have encouraged her to use      this liberally over the next week or so, and hopefully her need for      albuterol will diminish substantially as this current exacerbation      resolves.  3.  Avelox 400 mg daily x7 days.  4.  Prednisone 60 mg daily x3, 40 mg daily x3, 20 mg thereafter until seen      by Dr. Sherene Sires for further instructions.   SPECIAL INSTRUCTIONS:  She is to use the incentive spirometer as previously  instructed 4 times per day, and she is to use the flutter valve as  previously instructed for 10 deep breaths 2 times per day.  This regimen  might be modified by Dr. Sherene Sires upon follow up.  She has been instructed to  call our office at [phone] to arrange for follow up with Dr. Sherene Sires at the  end of the week.  I have given her approval to attend night time classes  this week with the assurance that she will carry her albuterol inhaler with  her at all times.            ______________________________  Oley Balm Sung Amabile, M.D. [facility]     DBS/MEDQ  D:  04/12/2006  T:  04/13/2006  Job:  [id number]   cc:   Charlaine Dalton. Sherene Sires, M.D. [facility]  [address]  [place]  [place] [address]

## 2011-03-28 NOTE — Assessment & Plan Note (Signed)
[facility]                             PULMONARY OFFICE NOTE   NAME:Brenda Rosales, Brenda Rosales                   MRN:          [id number]  DATE:10/07/2006                            DOB:          07-27-1986    HISTORY OF PRESENT ILLNESS:  Patient is a very pleasant 25 year old  white female patient of Dr. Sherene Sires who has a history of asthma that  presents for an acute office visit complaining of a productive cough  with thick yellow sputum, nasal congestion, fever, chills, sweats and  wheezing x4 days.  Patient denies any hemoptysis, chest pain, orthopnea,  PND, recent travel or antibiotic use.  Patient has been well controlled  on Advair 250/50 twice daily, and very rarely uses her albuterol up  until the last 4 days.  Over the last 4 days she has had to use  albuterol 2 to 3 times a day.   PAST MEDICAL HISTORY:  Reviewed.   CURRENT MEDICATIONS:  Reviewed.   PHYSICAL EXAMINATION:  Patient is a pleasant female, in no acute  distress.  She is afebrile with stable vital signs.  02 saturation is 96% on room  air.  HEENT:  Nasal mucosa is erythematous.  Non-tender sinuses.  TMs are  normal.  Posterior pharynx is clear.  NECK:  Supple without adenopathy.  LUNGS:  Reveal coarse breath sounds with a few expiratory wheezes  bilaterally.  CARDIAC:  Regular rate and rhythm.  ABDOMEN:  Soft without hepatosplenomegaly.  EXTREMITIES:  Warm without any calf tenderness, cyanosis, clubbing or  edema.   IMPRESSION AND PLAN:  Acute exacerbation of asthmatic bronchitis.  Patient is to begin Omnicef x7 days.  Mucinex DM twice daily.  May use  Endal HD #8 ounces 1 to 2 teaspoons every 4 to 6 hours as needed.  No  refills given.  Patient is aware of sedating effect.  Patient was given  a Xopenex nebulizer treatment in the office.  She is to be on a  prednisone taper over the next week.  She will return back with Dr. Sherene Sires  as scheduled, or sooner if needed.      Rubye Oaks, NP  Electronically Signed      Charlaine Dalton. Sherene Sires, MD, [place]  Electronically Signed   TP/MedQ  DD: 10/08/2006  DT: 10/08/2006  Job #: [id number]

## 2011-03-28 NOTE — Assessment & Plan Note (Signed)
[facility]                               PULMONARY OFFICE NOTE   Brenda Rosales, Brenda Rosales                   MRN:          [id number]  DATE:05/29/2006                            DOB:          1986-03-11    HISTORY:  This is a 25 year old white female, never smoker, who presented  with status asthmaticus on Mar 28, 2006, and has never required chronic  prednisone previously.  She was tapered off of prednisone completely on June  12, and returns today all smiles.  On Advair, 500/50 b.i.d. and Nasacort,  two puffs b.i.d., with no need for p.r.n. Allegra-D or albuterol.  No  nocturnal respiratory complaints.  She states that she does get short of  breath with heavy exercise, but this resolves immediately with rest.   PHYSICAL EXAMINATION:  GENERAL:  She is a slightly cushingoid, ambulatory  female, in acute distress.  VITAL SIGNS:  Normal.  HEENT:  Is remarkable for minimal turbinate edema.  Oropharynx is clear.  NECK:  Supple without cervical adenopathy or tenderness.  Trachea is  midline.  LUNGS:  Lung fields perfectly clear bilaterally to auscultation and  percussion.  HEART:  Regular rhythm without murmur, gallop or rub.  ABDOMEN:  Soft.  Benign.  EXTREMITIES:  Warm without calf tenderness.  No cyanosis, clubbing or edema.   Chest x-ray from June 21 is normal, with complete resolution of previous  atelectatic changes in the right middle lobe.   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  However, I  note that she is doing so well on her present regimen, I do not believe,  even if she had it, that the treatment would be any different, and therefore  recommend the following:  1.  Try reducing Nasacort to one puff, b.i.d. and Advair to 250/50 one      b.i.d., to reduce both cost and the possibility of both topical and      slight spillover systemic  steroid activity in this patient, on combined      therapy with both nasal and inhaled steroids.  2.  Followup here in six weeks.  If any time she needs rescue therapy she      should go immediately back to 500/50 b.i.d. dosing, and I reviewed this      issue with her and her mother in detail today.                                   Charlaine Dalton. Sherene Sires, MD, [facility]   MBW/MedQ  DD:  05/29/2006  DT:  05/29/2006  Job #:  [id number]

## 2011-03-28 NOTE — Assessment & Plan Note (Signed)
[facility]                               PULMONARY OFFICE NOTE   Brenda Rosales, Brenda Rosales                   MRN:          [id number]  DATE:07/14/2006                            DOB:          1986-07-25    PULMONARY/FOLLOW-UP OFFICE VISIT:   HISTORY:  This is a 25 year old female with a history of asthma since the  age of 42, under excellent control since her previous visit on July 20 when  her Advair was reduced to 250/50 1 b.i.d., and her Nasacort was reduced down  to 1 puff b.i.d.  She has not needed any albuterol nor rescue therapy with  Allegra-D for nasal complaints.   PHYSICAL EXAMINATION:  GENERAL:  She is a pleasant ambulatory female in no  acute distress; however, she does appear minimally cushingoid.  VITAL SIGNS:  She is afebrile with normal vital signs.  HEENT:  Unremarkable but normal.  Turbinates and oropharynx are clear with  no thrush.  NECK:  Supple without cervical adenopathy, tinnitus.  Trachea is midline.  No thyromegaly.  LUNGS:  Lung fields are completely clear bilaterally to auscultation and  percussion.  HEART:  Regular rhythm without murmur, rub or gallop.  ABDOMEN:  Soft, benign.  EXTREMITIES:  Warm without calf tenderness, clubbing, cyanosis or edema.   PFTs were reviewed from her last visit and are completely normal.   IMPRESSION:  All goals of asthma achieved with a 250/50 b.i.d. dosing of  Advair and 1 puff b.i.d. dosing with Nasacort.  I would be tempted to reduce  the dose of Advair back to the 100/50 strength, but she tells me previously  she had difficulty with frequent flare-ups.  I am therefore recommended she  stay on the exact same regimen for three months and did review in detail  with her contingency plans for the use of albuterol and Allegra-D just on a  rescue basis.  To follow up here in the office within three days if any  increased need for either of these medications over baseline.                          Brenda Rosales. Brenda Sires, MD, [facility]   MBW/MedQ  DD:  07/14/2006  DT:  07/14/2006  Job #:  [id number]   cc:   Rosalyn Gess. Norins, MD

## 2011-03-28 NOTE — Discharge Summary (Signed)
NAME:  Brenda Rosales, Brenda Rosales NO.:  [id number]   MEDICAL RECORD NO.:  [id number]          PATIENT TYPE:  INP   LOCATION:  1413                         [facility]ACILITY:  [facility]   PHYSICIAN:  Rosalyn Gess. Norins, M.D. LHCDATE OF BIRTH:  [birth year]   DATE OF ADMISSION:  06/17/2006  DATE OF DISCHARGE:  06/18/2006                                 DISCHARGE SUMMARY   ADMISSION DIAGNOSES:  1. Abdominal pain.  2. Vomiting.  3. Diarrhea.  4. Dehydration.  5. Elevated LFTs with fatty liver infiltration.  6. Asthma.   DISCHARGE DIAGNOSES:  1. Viral gastroenteritis.  2. Abdominal pain.  3. Vomiting.  4. Diarrhea.  5. Dehydration.  6. Elevated LFTs with fatty liver infiltration.  7. Asthma.   CONSULTANTS:  None.   PROCEDURES:  CT scan of the abdomen and pelvis which revealed some  inflammatory changes in the ileum with differential diagnosis including  viral enteritis.  Patient is unlikely to have Crohn's disease without  obvious signs of bloody mucousy stools, fevers, or other signs of  inflammation or infection.   HISTORY OF THE PRESENT ILLNESS:  Patient is a 25 year old woman with history  of severe asthma and allergy who had had nausea and vomiting since Sunday,  the 5th of August with up to six episodes of emesis daily, diarrhea up to 10  times daily, and significant abdominal pain.  She was given Zofran and  Dilaudid and continued to have discomfort.  She was subsequently admitted to  hospital for IV hydration and further testing.   Admitting laboratory revealed a hemoglobin 16.7 g, hematocrit 48%, white  count 14,400 with 75% segs, 11% lymphocytes, 5% eosinophils, 4% monocytes.  Admitting chemistries with a sodium 139, potassium 3.8, BUN of 9, creatinine  0.7, glucose was 123, AST 50, ALT 94, alkaline phosphatase 62, total  bilirubin 1.7, lipase 21, albumin was 4.2, total protein was 7.7.   HOSPITAL COURSE:  The patient was admitted to a regular bed.  And she was  started on IV fluids.  She was given IV anti-emetics.  CT scan and labs were  ordered and done with results as above.  Patient over night improved and  through the course of the day, August 9, she was able to take progressive  diet so that she had a regular supper.  She has residual abdominal  tenderness by her report but she has not had any recurrent nausea or  vomiting.  She has had no recurrent diarrhea.  With the patient taking p.o.  food and fluids with resolution of her discomfort she was thought to be  stable and ready for discharge home.   DISCHARGE EXAMINATION:  VITAL SIGNS:  Temperature of 97.9, blood pressure  118/79, heart rate 84, respirations 18.  GENERAL APPEARANCE:  A 25 year old woman lying in bed.  She seems to be  comfortable with no acute distress.  ABDOMEN:  Patient had positive bowel sounds in all four quadrants.  She had  mild tenderness to deep palpation.  She had no guarding or rebound.   FINAL LABORATORY:  Patient had a hepatitis screen which was positive for  hepatitis B surface antigen and antibody, negative for core antigen.  This  is consistent with her full scale immunization which she completed for  college.  Hepatitis A was negative.  CBC revealed her white count had  returned to normal at 6.5, her hemoglobin had dropped to 14.1 consistent  with hydration after being volume contracted.  Repeat chemistries remained  stable with a potassium of 4.4, creatinine at 0.7, BUN dropped to 2, glucose  remained elevated at 130.  Follow-up liver functions with an AST of 47, ALT  of 61, alkaline phosphatase 44, total bilirubin 2, albumin 3.2.  Urinalysis  was negative.  Sedimentation rate was normal.  Pregnancy test was negative.  Hepatitis A antibody screen as noted.   DISPOSITION:  Patient is discharged home.  She will continue all of her  respiratory medications.  She will continue to take Phenergan 12.5 mg q.6h.  p.r.n.  She can advance her diet.  I have asked  her to return for laboratory  for follow-up on her liver functions.  Will also add a hemoglobin A1c to  that.  She will see me in the office the following week and call for an  appointment either August 20 or 21.  Patient's condition at time of  discharge dictation is stable and improved.           ______________________________  Rosalyn Gess Norins, M.D. [facility]     MEN/MEDQ  D:  06/18/2006  T:  06/18/2006  Job:  [id number]

## 2011-08-06 LAB — LIPID PANEL
Cholesterol: 229 — ABNORMAL HIGH
LDL Cholesterol: 145 — ABNORMAL HIGH
Total CHOL/HDL Ratio: 6.7

## 2011-08-06 LAB — ANA: Anti Nuclear Antibody(ANA): NEGATIVE

## 2011-08-06 LAB — DIFFERENTIAL
Basophils Relative: 0
Monocytes Relative: 2 — ABNORMAL LOW
Neutro Abs: 16 — ABNORMAL HIGH
Neutrophils Relative %: 90 — ABNORMAL HIGH

## 2011-08-06 LAB — CERULOPLASMIN: Ceruloplasmin: 36

## 2011-08-06 LAB — COMPREHENSIVE METABOLIC PANEL
Alkaline Phosphatase: 57
BUN: 12
Calcium: 9.7
Glucose, Bld: 156 — ABNORMAL HIGH
Total Protein: 8.6 — ABNORMAL HIGH

## 2011-08-06 LAB — HEPATITIS C ANTIBODY: HCV Ab: NEGATIVE

## 2011-08-06 LAB — ALPHA-1-ANTITRYPSIN: A-1 Antitrypsin, Ser: 122

## 2011-08-06 LAB — CBC
HCT: 47.7 — ABNORMAL HIGH
Hemoglobin: 16.9 — ABNORMAL HIGH
MCHC: 35.4
MCV: 88.9
RDW: 13.3

## 2011-08-06 LAB — MITOCHONDRIAL ANTIBODIES: Mitochondrial M2 Ab, IgG: <20.1 Units (ref <20.1)

## 2011-08-06 LAB — ANTI-SMOOTH MUSCLE ANTIBODY, IGG: F-Actin IgG: <20 U (ref <20)

## 2011-08-07 ENCOUNTER — Encounter: Payer: Self-pay | Admitting: Internal Medicine

## 2011-08-07 ENCOUNTER — Ambulatory Visit (INDEPENDENT_AMBULATORY_CARE_PROVIDER_SITE_OTHER): Payer: BC Managed Care – PPO | Admitting: Internal Medicine

## 2011-08-07 VITALS — BP 110/80 | HR 80 | Temp 98.4°F | Resp 16 | Wt 159.0 lb

## 2011-08-07 DIAGNOSIS — H019 Unspecified inflammation of eyelid: Secondary | ICD-10-CM

## 2011-08-07 DIAGNOSIS — L259 Unspecified contact dermatitis, unspecified cause: Secondary | ICD-10-CM

## 2011-08-07 HISTORY — DX: Unspecified inflammation of eyelid: H01.9

## 2011-08-07 MED ORDER — ERYTHROMYCIN 5 MG/GM OP OINT: TOPICAL_OINTMENT | Freq: Every day | OPHTHALMIC | Status: AC

## 2011-08-07 MED ORDER — AMOXICILLIN-POT CLAVULANATE 875-125 MG PO TABS: 1.0000 | ORAL_TABLET | Freq: Two times a day (BID) | ORAL | Status: AC

## 2011-08-07 MED ORDER — HYDROCODONE-ACETAMINOPHEN 5-325 MG PO TABS: 1.0000 | ORAL_TABLET | ORAL | Status: AC | PRN

## 2011-08-07 NOTE — Assessment & Plan Note (Signed)
opthal referral to Dr. Nelle Don at [facility] this pm  9/12 acute R lower mid-eyelid abscess by [place]  Rocephin 1 d IM Augmentin x 10 d Vicodin Erythro oint

## 2011-08-07 NOTE — Progress Notes (Signed)
  Subjective:    Patient ID: Brenda Rosales, female    DOB: 1986/03/18, 25 y.o.   MRN: [id number]  HPI  C/o R eye severe throbbing pain, swelling and chills x 4-5 d  Review of Systems  Constitutional: Positive for fever.  HENT: Negative for congestion.   Eyes: Positive for pain and redness. Negative for photophobia, discharge, itching and visual disturbance.       Objective:   Physical Exam  Constitutional: She appears well-developed. No distress.  HENT:  Head: Normocephalic.  Right Ear: External ear normal.  Left Ear: External ear normal.  Nose: Nose normal.  Mouth/Throat: Oropharynx is clear and moist.  Eyes: Right eye exhibits no discharge. Left eye exhibits no discharge.       R lower medial eyelid is swollen with a 10x6 mm erythematous infiltrate and a "white head" in the center, tender; erythema is wider  Neck: Normal range of motion. Neck supple. No JVD present. No tracheal deviation present. No thyromegaly present.  Cardiovascular: Normal rate, regular rhythm and normal heart sounds.   Pulmonary/Chest: No stridor. No respiratory distress. She has no wheezes.  Abdominal: Soft. Bowel sounds are normal. She exhibits no distension and no mass. There is no tenderness. There is no rebound and no guarding.  Musculoskeletal: She exhibits no edema and no tenderness.  Lymphadenopathy:    She has no cervical adenopathy.  Neurological: She displays normal reflexes. No cranial nerve deficit. She exhibits normal muscle tone. Coordination normal.  Skin: No rash noted. There is erythema.       Dry skin - eczema  Psychiatric: She has a normal mood and affect. Her behavior is normal. Judgment and thought content normal.      Procedure Note :    Procedure :   Point of care (POC) sonography examination of the R eye lesion   Indication: r/o eyelid abscess   Equipment used: Sonosite M-Turbo with HFL38x/13-6 MHz transducer linear probe. The images were stored in the unit and later  transferred in storage.  The patient was placed in a decubitus position. Sterile gel was used  This study revealed a hypoechoic irregular cavity  In the R lower medial eyelid c/w an abscess   Impression: R eye lower eyelid abscess       Assessment & Plan:

## 2011-08-07 NOTE — Assessment & Plan Note (Signed)
Continue with current prescription therapy as reflected on the Med list.  

## 2011-08-08 MED ORDER — CEFTRIAXONE SODIUM 1 G IJ SOLR
1.0000 g | Freq: Once | INTRAMUSCULAR | Status: AC
  Administered 2011-08-08: 1 g via INTRAMUSCULAR

## 2011-08-08 NOTE — Progress Notes (Signed)
Addended by: Merrilyn Puma on: 08/08/2011 09:27 AM   Modules accepted: Orders

## 2011-08-11 ENCOUNTER — Ambulatory Visit: Payer: BC Managed Care – PPO | Admitting: Internal Medicine

## 2011-09-18 ENCOUNTER — Encounter: Payer: Self-pay | Admitting: Internal Medicine

## 2011-09-22 ENCOUNTER — Ambulatory Visit (INDEPENDENT_AMBULATORY_CARE_PROVIDER_SITE_OTHER): Payer: BC Managed Care – PPO | Admitting: Internal Medicine

## 2011-09-22 VITALS — BP 106/72 | HR 76 | Temp 98.3°F | Wt 154.0 lb

## 2011-09-22 DIAGNOSIS — E1165 Type 2 diabetes mellitus with hyperglycemia: Secondary | ICD-10-CM | POA: Insufficient documentation

## 2011-09-22 DIAGNOSIS — IMO0002 Reserved for concepts with insufficient information to code with codable children: Secondary | ICD-10-CM | POA: Insufficient documentation

## 2011-09-22 DIAGNOSIS — IMO0001 Reserved for inherently not codable concepts without codable children: Secondary | ICD-10-CM

## 2011-09-22 NOTE — Patient Instructions (Signed)
Newly diagnosed diabetes - A1C was 11% - unequivocal diagnosis of diabetes. Plan - No Sugar diet, low carb. Go to [email or web address] for detailed information. Regular exercise. Take the metformin once a day for about a week then twice a day. If you cannot tolerate the medication call. Return in Feb for lab.

## 2011-09-23 NOTE — Progress Notes (Signed)
  Subjective:    Patient ID: Brenda Rosales, female    DOB: Nov 05, 1986, 25 y.o.   MRN: [id number]  HPI Ms. Hagmann presents for follow-up after being diagnosed at [facility] as a diabetic. She had become sick at work with sweats, nausea, weakness. She was seen by NP who checked CBG at 300+. She was started on metformin which she has not taken. Called [facility]: verbal report of A1C of 11%! Reviewed old records back two years: elevated serum glucose 169. 130, three years ago 106. She is presently asymptomatic.  Past Medical History  Diagnosis Date  . ASTHMA 11/19/2007  . CHICKENPOX 11/19/2007  . Dysmenorrhea 11/19/2007  . DYSPHAGIA 11/19/2010  . ECZEMA 11/19/2007  . FATTY LIVER DISEASE 04/17/2008  . GERD 11/20/2010  . Infection of eyelid 08/07/2011  . RHINOSINUSITIS, CHRONIC 12/09/2007  . TB SKIN TEST, POSITIVE 11/19/2007   Past Surgical History  Procedure Date  . Mole excision     x's 4  . Ct sinus ltd w/o cm 09/23/07   Family History  Problem Relation Age of Onset  . Adopted: Yes   History   Social History  . Marital Status: Single    Spouse Name: N/A    Number of Children: N/A  . Years of Education: N/A   Occupational History  . Not on file.   Social History Main Topics  . Smoking status: Never Smoker   . Smokeless tobacco: Not on file  . Alcohol Use: No  . Drug Use: No  . Sexually Active: Not on file   Other Topics Concern  . Not on file   Social History Narrative   Adopted from [place]. 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     Objective:   Physical Exam Vitals - reveiwed and normal Gen'l- mildly overweight columbian woman in no distress Cor - 2+ radial Pulm - normal respirations Neuro - A&O x 3       Assessment & Plan:

## 2011-09-23 NOTE — Assessment & Plan Note (Signed)
Reviewed the mechanism of disease in detail. Discussed treatments with an emphasis on life-style management. Referred her to [email or web address] for detailed information on diet.  Plan - advised to take metformin 500mg  once a day for 5-7 days then bid            No sugar, low carb diet           Repeat A1C in 3 months.

## 2011-10-29 ENCOUNTER — Ambulatory Visit (INDEPENDENT_AMBULATORY_CARE_PROVIDER_SITE_OTHER): Payer: BC Managed Care – PPO | Admitting: Internal Medicine

## 2011-10-29 VITALS — BP 110/60 | HR 97 | Temp 98.2°F | Wt 149.0 lb

## 2011-10-29 DIAGNOSIS — J069 Acute upper respiratory infection, unspecified: Secondary | ICD-10-CM

## 2011-10-29 MED ORDER — AMOXICILLIN-POT CLAVULANATE 875-125 MG PO TABS: 1.0000 | ORAL_TABLET | Freq: Two times a day (BID) | ORAL | Status: AC

## 2011-10-29 NOTE — Progress Notes (Signed)
  Subjective:    Patient ID: Brenda Rosales, female    DOB: [birth year], 25 y.o.   MRN: [id number]  HPI Brenda Rosales presents for evaluation of URI symptoms: 5 days of sinus pressure, rhinorrhea of a purulent drainage, myalgias. NO increased wheezing or SOB. She denies N/V/D, no chest pain. She has tried otc products to no avail. This is similar to prior URI episodes.  I have reviewed the patient's medical history in detail and updated the computerized patient record.    Review of Systems System review is negative for any constitutional, cardiac, pulmonary, GI or neuro symptoms or complaints other than as described in the HPI.     Objective:   Physical Exam Vitals - afebrile Gen'l- WNWD woman in no distress HEENT- tender to percussion over the maxillary sinus left, frontal sinus bilaterally. EACs/TMs normal Chest - good breath sounds, no wheezing Cor - RRR       Assessment & Plan:  URI - patient with history or asthma and significant sinus infections. 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

## 2011-10-29 NOTE — Patient Instructions (Signed)
URI - we are getting there early, a good thing. Usual care: tylenol for fever, aches; hydrate; continue all your regular meds; augmentin twice a day for 7 days. Call for fever, shortness of breath, increased wheezing or any other problem.

## 2011-12-15 ENCOUNTER — Encounter: Payer: Self-pay | Admitting: Internal Medicine

## 2011-12-23 ENCOUNTER — Other Ambulatory Visit (INDEPENDENT_AMBULATORY_CARE_PROVIDER_SITE_OTHER): Payer: BC Managed Care – PPO

## 2011-12-23 DIAGNOSIS — IMO0001 Reserved for inherently not codable concepts without codable children: Secondary | ICD-10-CM

## 2011-12-23 LAB — COMPREHENSIVE METABOLIC PANEL
ALT: 27 U/L (ref 0–35)
AST: 29 U/L (ref 0–37)
Albumin: 3.8 g/dL (ref 3.5–5.2)
Alkaline Phosphatase: 47 U/L (ref 39–117)
BUN: 13 mg/dL (ref 6–23)
CO2: 23 mEq/L (ref 19–32)
Calcium: 9.3 mg/dL (ref 8.4–10.5)
Chloride: 107 mEq/L (ref 96–112)
Creatinine, Ser: 0.7 mg/dL (ref 0.4–1.2)
GFR: 102.42 mL/min (ref >60.00)
Glucose, Bld: 202 mg/dL — ABNORMAL HIGH (ref 70–99)
Potassium: 3.8 mEq/L (ref 3.5–5.1)
Sodium: 139 mEq/L (ref 135–145)
Total Bilirubin: 0.7 mg/dL (ref 0.3–1.2)
Total Protein: 7.9 g/dL (ref 6.0–8.3)

## 2011-12-28 ENCOUNTER — Encounter: Payer: Self-pay | Admitting: Internal Medicine

## 2012-01-07 ENCOUNTER — Encounter: Payer: Self-pay | Admitting: Internal Medicine

## 2012-01-07 ENCOUNTER — Ambulatory Visit (INDEPENDENT_AMBULATORY_CARE_PROVIDER_SITE_OTHER): Payer: BC Managed Care – PPO | Admitting: Internal Medicine

## 2012-01-07 DIAGNOSIS — K589 Irritable bowel syndrome without diarrhea: Secondary | ICD-10-CM

## 2012-01-07 DIAGNOSIS — F418 Other specified anxiety disorders: Secondary | ICD-10-CM

## 2012-01-07 DIAGNOSIS — F411 Generalized anxiety disorder: Secondary | ICD-10-CM

## 2012-01-07 MED ORDER — SERTRALINE HCL 50 MG PO TABS: 50.0000 mg | ORAL_TABLET | Freq: Every day | ORAL | Status: DC

## 2012-01-07 MED ORDER — HYOSCYAMINE SULFATE 0.125 MG SL SUBL: SUBLINGUAL_TABLET | SUBLINGUAL | Status: DC

## 2012-01-07 NOTE — Patient Instructions (Addendum)
Symptoms very much sound like IBS which is and can be driven by anxiety. There is no evidence on exam of an infectious disease. The anxiety is situational in nature.  Plan - hyoscamine SL every 2 hours as needed for symptoms - start it when the symptoms start not when they are full blown           Zoloft, sertraline, 50 mg once a day every day. OK to continue this for as long as you have a very stressful situation.   Irritable Bowel Syndrome Irritable Bowel Syndrome (IBS) is caused by a disturbance of normal bowel function. Other terms used are spastic colon, mucous colitis, and irritable colon. It does not require surgery, nor does it lead to cancer. There is no cure for IBS. But with proper diet, stress reduction, and medication, you will find that your problems (symptoms) will gradually disappear or improve. IBS is a common digestive disorder. It usually appears in late adolescence or early adulthood. Women develop it twice as often as men. CAUSES   After food has been digested and absorbed in the small intestine, waste material is moved into the colon (large intestine). In the colon, water and salts are absorbed from the undigested products coming from the small intestine. The remaining residue, or fecal material, is held for elimination. Under normal circumstances, gentle, rhythmic contractions on the bowel walls push the fecal material along the colon towards the rectum. In IBS, however, these contractions are irregular and poorly coordinated. The fecal material is either retained too long, resulting in constipation, or expelled too soon, producing diarrhea. SYMPTOMS   The most common symptom of IBS is pain. It is typically in the lower left side of the belly (abdomen). But it may occur anywhere in the abdomen. It can be felt as heartburn, backache, or even as a dull pain in the arms or shoulders. The pain comes from excessive bowel-muscle spasms and from the buildup of gas and fecal material in the  colon. This pain:  Can range from sharp belly (abdominal) cramps to a dull, continuous ache.     Usually worsens soon after eating.     Is typically relieved by having a bowel movement or passing gas.  Abdominal pain is usually accompanied by constipation. But it may also produce diarrhea. The diarrhea typically occurs right after a meal or upon arising in the morning. The stools are typically soft and watery. They are often flecked with secretions (mucus). Other symptoms of IBS include:  Bloating.     Loss of appetite.     Heartburn.    Feeling sick to your stomach (nausea).     Belching    Vomiting    Gas.  IBS may also cause a number of symptoms that are unrelated to the digestive system:  Fatigue.     Headaches.    Anxiety    Shortness of breath     Difficulty in concentrating.     Dizziness.  These symptoms tend to come and go. DIAGNOSIS   The symptoms of IBS closely mimic the symptoms of other, more serious digestive disorders. So your caregiver may wish to perform a variety of additional tests to exclude these disorders. He/she wants to be certain of learning what is wrong (diagnosis). The nature and purpose of each test will be explained to you. TREATMENT A number of medications are available to help correct bowel function and/or relieve bowel spasms and abdominal pain. Among the drugs available are:  Mild,  non-irritating laxatives for severe constipation and to help restore normal bowel habits.     Specific anti-diarrheal medications to treat severe or prolonged diarrhea.     Anti-spasmodic agents to relieve intestinal cramps.     Your caregiver may also decide to treat you with a mild tranquilizer or sedative during unusually stressful periods in your life.  The important thing to remember is that if any drug is prescribed for you, make sure that you take it exactly as directed. Make sure that your caregiver knows how well it worked for you. HOME CARE  INSTRUCTIONS    Avoid foods that are high in fat or oils. Some examples NWG:NFAOZ cream, butter, frankfurters, sausage, and other fatty meats.     Avoid foods that have a laxative effect, such as fruit, fruit juice, and dairy products.     Cut out carbonated drinks, chewing gum, and "gassy" foods, such as beans and cabbage. This may help relieve bloating and belching.     Bran taken with plenty of liquids may help relieve constipation.     Keep track of what foods seem to trigger your symptoms.     Avoid emotionally charged situations or circumstances that produce anxiety.     Start or continue exercising.     Get plenty of rest and sleep.  MAKE SURE YOU:    Understand these instructions.     Will watch your condition.     Will get help right away if you are not doing well or get worse.  Document Released: 10/27/2005 Document Revised: 07/09/2011 Document Reviewed: 06/16/2008 [facility] Patient Information 2012 [place], [place].

## 2012-01-07 NOTE — Progress Notes (Signed)
  Subjective:    Patient ID: Brenda Rosales, female    DOB: [birth year], 26 y.o.   MRN: [id number]  HPI Brenda Rosales went to an urgent care 2/15 after two prior episodes of discomfort.and was diagnosed with IBS vs appendicitis vs diverticulitis. She given cipro for general, levsin sl, demerol tablets. She was better in 24 hrs. She is having recurrent symptoms of tightness, borborygmi and in the past this has lead to pain, N/V, diarrhea. Lots of stress that is work related. NO fever or rigors; emesis without coffee-grounds or blood; diarrhea without blood but bilious. No RUQ pain or radiation to the back. She does get bloats.   PMH, FamHx and SocHx reviewed for any changes and relevance.   Review of Systems System review is negative for any constitutional, cardiac, pulmonary, GI or neuro symptoms or complaints other than as described in the HPI.      Objective:   Physical Exam Filed Vitals:   01/07/12 1523  BP: 102/66  Pulse: 73  Temp: 97.8 F (36.6 C)  Resp: 16  Weight: 152 lb 4 oz (69.06 kg)  Gen'l- WNWD woman in no acute distress but clearly emotionally stressed HEENT- normal Pulm - normal respirations. Cor - RRR Abd - BS+ x 4, no HSM, no guarding or rebound tenderness, no severe tenderness to percussion or deep palpation with specific attention to LLQ. Neuro - A&O x 3, non-focal Psych - appears a bit emotionally distraught and frustrated.        Assessment & Plan:  (greater than 50% of a 40 minute visit spent on education and counseling)

## 2012-01-08 DIAGNOSIS — K589 Irritable bowel syndrome without diarrhea: Secondary | ICD-10-CM | POA: Insufficient documentation

## 2012-01-08 DIAGNOSIS — F418 Other specified anxiety disorders: Secondary | ICD-10-CM | POA: Insufficient documentation

## 2012-01-08 NOTE — Assessment & Plan Note (Signed)
Many symptoms that strongly suggest IBS. No history or findings on exam to suggest infectious process. She has not had evidence of dyspepsia or PUD.  Plan - patient educated about IBS and provided handout           Levsin SL q 2 hrs prn          Stress management - see below.

## 2012-01-08 NOTE — Assessment & Plan Note (Signed)
Very difficult and trying situation at work which is clearly causing an anxiety reaction along with stimulating an IBS response. 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  Plan - sertraline 50 mg once a day.

## 2012-01-21 ENCOUNTER — Ambulatory Visit (INDEPENDENT_AMBULATORY_CARE_PROVIDER_SITE_OTHER): Payer: BC Managed Care – PPO | Admitting: Internal Medicine

## 2012-01-21 ENCOUNTER — Encounter: Payer: Self-pay | Admitting: Internal Medicine

## 2012-01-21 VITALS — BP 104/62 | HR 60 | Ht 63.0 in | Wt 151.4 lb

## 2012-01-21 DIAGNOSIS — K222 Esophageal obstruction: Secondary | ICD-10-CM

## 2012-01-21 DIAGNOSIS — K7689 Other specified diseases of liver: Secondary | ICD-10-CM

## 2012-01-21 DIAGNOSIS — K760 Fatty (change of) liver, not elsewhere classified: Secondary | ICD-10-CM

## 2012-01-21 DIAGNOSIS — R933 Abnormal findings on diagnostic imaging of other parts of digestive tract: Secondary | ICD-10-CM

## 2012-01-21 DIAGNOSIS — K219 Gastro-esophageal reflux disease without esophagitis: Secondary | ICD-10-CM

## 2012-01-21 MED ORDER — OMEPRAZOLE 40 MG PO CPDR: 40.0000 mg | DELAYED_RELEASE_CAPSULE | Freq: Every day | ORAL | Status: DC

## 2012-01-21 NOTE — Progress Notes (Signed)
HISTORY OF PRESENT ILLNESS:  Brenda Rosales is a 26 y.o. female with the below listed medical history who presents today regarding followup of her GERD. She has been seen previously for episodic abdominal pain with abnormal CT scan showing inflammatory changes of the distal small bowel. Subsequent negative workup including colonoscopy with ileal intubation. Difficult to tell from the chart whether she had a capsule endoscopy or not. Last seen in January 2012 which she underwent upper endoscopy with [facility] dilation of the esophagus for dysphagia. She was found to have distal esophagitis as well as a peptic stricture. She has been maintained on omeprazole 40 mg daily. No reflux symptoms on the medication. No appreciable side effects. No recurrent dysphagia. She is also followed for fatty liver with associated elevated transaminases. Recent problems with a different type of abdominal discomfort, evaluated by her PCP, and felt secondary to stress. Now resolved  REVIEW OF SYSTEMS:  All non-GI ROS negative except for  anxiety  Past Medical History  Diagnosis Date  . ASTHMA 11/19/2007  . CHICKENPOX 11/19/2007  . Dysmenorrhea 11/19/2007  . DYSPHAGIA 11/19/2010  . ECZEMA 11/19/2007  . FATTY LIVER DISEASE 04/17/2008  . GERD 11/20/2010  . Infection of eyelid 08/07/2011  . RHINOSINUSITIS, CHRONIC 12/09/2007  . TB SKIN TEST, POSITIVE 11/19/2007  . Esophageal stricture   . Esophagitis   . Duodenitis     Past Surgical History  Procedure Date  . Mole excision     x's 4  . Ct sinus ltd w/o cm 09/23/07    Social History Brenda Rosales  reports that she has never smoked. She has never used smokeless tobacco. She reports that she does not drink alcohol or use illicit drugs.  family history is not on file.  She is adopted.  No Known Allergies     PHYSICAL EXAMINATION: Vital signs: BP 104/62  Pulse 60  Ht 5\' 3"  (1.6 m)  Wt 151 lb 6.4 oz (68.675 kg)  BMI 26.82 kg/m2  LMP 01/19/2012 General:  Well-developed, well-nourished, no acute distress. Obese HEENT: Sclerae are anicteric, conjunctiva pink. Oral mucosa intact Lungs: Clear Heart: Regular Abdomen: soft,obese, nontender, nondistended, no obvious ascites, no peritoneal signs, normal bowel sounds. No organomegaly. Extremities: No edema. Bruises on right arm Psychiatric: alert and oriented x3. Cooperative    ASSESSMENT:  #1. GERD complicated by esophagitis and symptomatic peptic stricture. Currently asymptomatic post dilation on omeprazole. #2. Recurrent problems with abdominal pain and thickening of small bowel on CT. Negative colonoscopy with ileal intubation. Currently asymptomatic. He has recurrent problems, will need small bowel imaging, if not done previously #3. Fatty liver   PLAN: #1. Continue omeprazole. Prescription refilled #2. Routine followup for GERD in 2 years. Sooner for interval problems such as breakthrough symptoms or recurrent dysphagia #3. Regular exercise and weight loss #4. Ongoing general medical care with Dr. Debby Bud

## 2012-01-21 NOTE — Patient Instructions (Signed)
We have sent the following medications to your pharmacy for you to pick up at your convenience:  Omeprazole  Please follow up in 2 years  

## 2012-01-26 ENCOUNTER — Other Ambulatory Visit: Payer: Self-pay | Admitting: Internal Medicine

## 2012-01-26 NOTE — Telephone Encounter (Signed)
May try wellbutrin XL 150 mg q AM.  OK for refill on ProAir - prn

## 2012-01-26 NOTE — Telephone Encounter (Signed)
Pt reports that Zoloft is making her more anxious and sensitive about things that bother her; c/o being extremely tired & borderline depressed. Request alternative Rx.

## 2012-01-27 MED ORDER — BUPROPION HCL ER (XL) 150 MG PO TB24: 150.0000 mg | ORAL_TABLET | Freq: Every morning | ORAL | Status: AC

## 2012-01-27 NOTE — Telephone Encounter (Signed)
Patient informed Rx to pharmacy.

## 2012-02-11 ENCOUNTER — Other Ambulatory Visit: Payer: Self-pay | Admitting: Internal Medicine

## 2012-03-02 ENCOUNTER — Telehealth: Payer: Self-pay

## 2012-03-02 NOTE — Telephone Encounter (Signed)
Pt's mother called stating pt was experiencing some SOB and wheezing that was not helped by her inhaler.  Mother was requesting appt with MEN right away. I called back and spoke with pt's mother who informed me that they decided to go to a local UC where they were waiting to see the MD. Mother will call back to advised MD of pt's condition.

## 2012-03-11 ENCOUNTER — Encounter: Payer: Self-pay | Admitting: Endocrinology

## 2012-03-11 ENCOUNTER — Ambulatory Visit (INDEPENDENT_AMBULATORY_CARE_PROVIDER_SITE_OTHER): Payer: BC Managed Care – PPO | Admitting: Endocrinology

## 2012-03-11 VITALS — BP 100/72 | HR 71 | Temp 97.6°F | Wt 156.0 lb

## 2012-03-11 DIAGNOSIS — J209 Acute bronchitis, unspecified: Secondary | ICD-10-CM

## 2012-03-11 MED ORDER — TRAMADOL HCL 50 MG PO TABS: 50.0000 mg | ORAL_TABLET | Freq: Four times a day (QID) | ORAL | Status: AC | PRN

## 2012-03-11 MED ORDER — CEFUROXIME AXETIL 250 MG PO TABS: 250.0000 mg | ORAL_TABLET | Freq: Two times a day (BID) | ORAL | Status: AC

## 2012-03-11 NOTE — Patient Instructions (Signed)
i have sent 2 prescriptions to your pharmacy: for an antibiotic, and cough medication. allegra-d (non-prescription) will help your congestion. I hope you feel better soon.  If you don't feel better by next week, please call back.

## 2012-03-11 NOTE — Progress Notes (Signed)
Subjective:    Patient ID: Brenda Rosales, female    DOB: 05/26/1986, 26 y.o.   MRN: [id number]  HPI Pt states few days of moderate pain in the ears, and assoc prod-quality cough.   Past Medical History  Diagnosis Date  . ASTHMA 11/19/2007  . CHICKENPOX 11/19/2007  . Dysmenorrhea 11/19/2007  . DYSPHAGIA 11/19/2010  . ECZEMA 11/19/2007  . FATTY LIVER DISEASE 04/17/2008  . GERD 11/20/2010  . Infection of eyelid 08/07/2011  . RHINOSINUSITIS, CHRONIC 12/09/2007  . TB SKIN TEST, POSITIVE 11/19/2007  . Esophageal stricture   . Esophagitis   . Duodenitis     Past Surgical History  Procedure Date  . Mole excision     x's 4  . Ct sinus ltd w/o cm 09/23/07    History   Social History  . Marital Status: Single    Spouse Name: N/A    Number of Children: N/A  . Years of Education: N/A   Occupational History  . Teacher    Social History Main Topics  . Smoking status: Never Smoker   . Smokeless tobacco: Never Used  . Alcohol Use: No  . Drug Use: No  . Sexually Active: Not on file   Other Topics Concern  . Not on file   Social History Narrative   Adopted from [place]. 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 buPROPion (WELLBUTRIN XL) 150 MG 24 hr tablet Take 1 tablet (150 mg total) by mouth every morning.  30 tablet  5  . fexofenadine-pseudoephedrine (ALLEGRA-D 12 HOUR) 60-120 MG per tablet Take 1 tablet by mouth 2 (two) times daily.        Marland Kitchen FLUTICASONE FUROATE NA by Nasal route as needed.        . hyoscyamine (LEVSIN SL) 0.125 MG SL tablet Chew finely and swallow q 2 hours prn flare of IBS  60 tablet  5  . Norgestim-Eth Estrad Triphasic (TRI-SPRINTEC) 0.18/0.215/0.25 MG-35 MCG TABS Take by mouth daily.        Marland Kitchen omeprazole (PRILOSEC) 40 MG capsule Take 1 capsule (40 mg total) by mouth daily.  30 capsule  11  . PROAIR HFA 108 (90 BASE) MCG/ACT  inhaler INHALE TWO PUFFS BY MOUTH FOUR TIMES DAILY AS NEEDED  1 Inhaler  5  . Sodium Chloride-Sodium Bicarb (SINUS WASH NETI POT NA) by Nasal route.        . SYMBICORT 160-4.5 MCG/ACT inhaler INHALE TWO PUFFS BY MOUTH TWICE DAILY  1 Inhaler  9  . DISCONTD: metFORMIN (GLUCOPHAGE) 500 MG tablet Take 500 mg by mouth 2 (two) times daily with a meal.          No Known Allergies  Family History  Problem Relation Age of Onset  . Adopted: Yes    BP 100/72  Pulse 71  Temp(Src) 97.6 F (36.4 C) (Oral)  Wt 156 lb (70.761 kg)  SpO2 97%  LMP 03/10/2012    Review of Systems She denies fever, but has night sweats.  She has nasal congestion.    Objective:   Physical Exam VITAL SIGNS:  See vs page GENERAL: no distress head: no deformity eyes: no periorbital swelling, no proptosis external nose and ears are normal mouth: no lesion seen Both tm's are red NECK: There is no palpable thyroid enlargement.  No thyroid nodule is palpable.  No palpable lymphadenopathy at the anterior  neck. LUNGS:  Clear to auscultation       Assessment & Plan:  Acute bronchitis, new

## 2012-03-18 ENCOUNTER — Telehealth: Payer: Self-pay | Admitting: Internal Medicine

## 2012-03-18 NOTE — Telephone Encounter (Signed)
The pt called and is hoping to get advice on how to treat her possible sinus infection.  She was seen by Dr.Ellison for fluid in her ear.  She states she is still having symptoms of cough and fluid in her ear.  She is hoping something can be called into the pharmacy instead of coming in for an ov.  Thanks!

## 2012-03-18 NOTE — Telephone Encounter (Signed)
Called pt - she will come in 10:30 AM 5/10

## 2012-03-19 ENCOUNTER — Encounter: Payer: Self-pay | Admitting: Internal Medicine

## 2012-03-19 ENCOUNTER — Ambulatory Visit (INDEPENDENT_AMBULATORY_CARE_PROVIDER_SITE_OTHER): Payer: BC Managed Care – PPO | Admitting: Internal Medicine

## 2012-03-19 VITALS — Temp 98.0°F

## 2012-03-19 DIAGNOSIS — J328 Other chronic sinusitis: Secondary | ICD-10-CM

## 2012-03-19 MED ORDER — PREDNISONE 10 MG PO TABS: 10.0000 mg | ORAL_TABLET | Freq: Every day | ORAL | Status: DC

## 2012-03-19 NOTE — Progress Notes (Signed)
  Subjective:    Patient ID: Brenda Rosales, female    DOB: 1986/05/08, 26 y.o.   MRN: [id number]  HPI Ms. Brenda Rosales was seen May 2nd for ear pain and cough. Dr. Barrie Lyme note reviewed - he reported red TMs. Diagnosis was bronchitis and she was treated with ceftin 250 bid. She returns for continued pressure in the ears, cough - worse in the morning-productive of a yellowish sputum - cleared from thick,green/brown and rhinorrhea with yellow mucus. No fever reported. No wheezing or shortness of breath.  Past Medical History  Diagnosis Date  . ASTHMA 11/19/2007  . CHICKENPOX 11/19/2007  . Dysmenorrhea 11/19/2007  . DYSPHAGIA 11/19/2010  . ECZEMA 11/19/2007  . FATTY LIVER DISEASE 04/17/2008  . GERD 11/20/2010  . Infection of eyelid 08/07/2011  . RHINOSINUSITIS, CHRONIC 12/09/2007  . TB SKIN TEST, POSITIVE 11/19/2007  . Esophageal stricture   . Esophagitis   . Duodenitis    Past Surgical History  Procedure Date  . Mole excision     x's 4  . Ct sinus ltd w/o cm 09/23/07   Family History  Problem Relation Age of Onset  . Adopted: Yes   History   Social History  . Marital Status: Single    Spouse Name: N/A    Number of Children: N/A  . Years of Education: N/A   Occupational History  . Teacher    Social History Main Topics  . Smoking status: Never Smoker   . Smokeless tobacco: Never Used  . Alcohol Use: No  . Drug Use: No  . Sexually Active: Not on file   Other Topics Concern  . Not on file   Social History Narrative   Adopted from [place]. 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     Objective:   Physical Exam Filed Vitals:   03/19/12 1032  Temp: 98 F (36.7 C)   Gen'l WNWD woman in no distress HEENT - TMs are pearly with poor landmarks, poor movement with  insufflation. Throat is clear. Neck supple Nodes - negative Chest - clear       Assessment & Plan:

## 2012-03-19 NOTE — Patient Instructions (Signed)
Symptoms are consistent with eustachian tube dysfunction and a flare of allergy type symptoms. Since you are already on a full dose of allergy medications will treat with prednisone: 30 mg today, 20 mg daily for 3 days, 10 mg daily for 6 days. NO need for additional antibiotics. Continue all your medications but add mucinex 1200 mg twice a day, hydrate.   Barotitis Media Barotitis media is soreness (inflammation) of the area behind the eardrum (middle ear). This occurs when the auditory tube (Eustachian tube) leading from the back of the throat to the eardrum is blocked. When it is blocked air cannot move in and out of the middle ear to equalize pressure changes. These pressure changes come from changes in altitude when:  Flying.   Driving in the mountains.   Diving.  Problems are more likely to occur with pressure changes during times when you are congested as from:  Hay fever.   Upper respiratory infection.   A cold.  Damage or hearing loss (barotrauma) caused by this may be permanent. HOME CARE INSTRUCTIONS    Use medicines as recommended by your caregiver. Over the counter medicines will help unblock the canal and can help during times of air travel.   Do not put anything into your ears to clean or unplug them. Eardrops will not be helpful.   Do not swim, dive, or fly until your caregiver says it is all right to do so. If these activities are necessary, chewing gum with frequent swallowing may help. It is also helpful to hold your nose and gently blow to pop your ears for equalizing pressure changes. This forces air into the Eustachian tube.   For little ones with problems, give your baby a bottle of water or juice during periods when pressure changes would be anticipated such as during take offs and landings associated with air travel.   Only take over-the-counter or prescription medicines for pain, discomfort, or fever as directed by your caregiver.   A decongestant may be  helpful in de-congesting the middle ear and make pressure equalization easier. This can be even more effective if the drops (spray) are delivered with the head lying over the edge of a bed with the head tilted toward the ear on the affected side.   If your caregiver has given you a follow-up appointment, it is very important to keep that appointment. Not keeping the appointment could result in a chronic or permanent injury, pain, hearing loss and disability. If there is any problem keeping the appointment, you must call back to this facility for assistance.  SEEK IMMEDIATE MEDICAL CARE IF:    You develop a severe headache, dizziness, severe ear pain, or bloody or pus-like drainage from your ears.   An oral temperature above 102 F (38.9 C) develops.   Your problems do not improve or become worse.  MAKE SURE YOU:    Understand these instructions.   Will watch your condition.   Will get help right away if you are not doing well or get worse.  Document Released: 10/24/2000 Document Revised: 10/16/2011 Document Reviewed: 06/01/2008 [facility] Patient Information 2012 [place], [place].

## 2012-03-19 NOTE — Assessment & Plan Note (Signed)
Flare of allergy type symptoms with eustachian tube dysfunction and sinus congestion. No evidence of infection.  Plan - steroid burst and taper - short.  mucinex 1200 mg bid.

## 2012-06-21 ENCOUNTER — Ambulatory Visit (INDEPENDENT_AMBULATORY_CARE_PROVIDER_SITE_OTHER): Payer: BC Managed Care – PPO | Admitting: Internal Medicine

## 2012-06-21 ENCOUNTER — Encounter: Payer: Self-pay | Admitting: Internal Medicine

## 2012-06-21 ENCOUNTER — Ambulatory Visit: Payer: BC Managed Care – PPO | Admitting: Internal Medicine

## 2012-06-21 VITALS — BP 100/68 | HR 69 | Temp 98.4°F | Resp 16 | Wt 158.0 lb

## 2012-06-21 DIAGNOSIS — L0202 Furuncle of face: Secondary | ICD-10-CM

## 2012-06-21 MED ORDER — SULFAMETHOXAZOLE-TRIMETHOPRIM 800-160 MG PO TABS: 1.0000 | ORAL_TABLET | Freq: Two times a day (BID) | ORAL | Status: AC

## 2012-06-22 DIAGNOSIS — L0202 Furuncle of face: Secondary | ICD-10-CM | POA: Insufficient documentation

## 2012-06-22 NOTE — Assessment & Plan Note (Signed)
Recurrent facial boil. The last episode was seen by Dr. 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

## 2012-06-22 NOTE — Progress Notes (Signed)
  Subjective:    Patient ID: Brenda Rosales, female    DOB: 01/19/1986, 26 y.o.   MRN: [id number]  HPI Ms. 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 She has had multiple infections and has had dermatology evaluation by Dr. Terri Piedra in the past. He felt this would be a chronic and recurrent problem. This boil erupted several days ago and there has been some clear drainage. The involved area is swollen, red and tender. No report of fever or chills, N/V, dizziness or other sign of hypotension.  PMH, FamHx and SocHx reviewed for any changes and relevance.  Current Outpatient Prescriptions on File Prior to Visit  Medication Sig Dispense Refill  . buPROPion (WELLBUTRIN XL) 150 MG 24 hr tablet Take 1 tablet (150 mg total) by mouth every morning.  30 tablet  5  . fexofenadine-pseudoephedrine (ALLEGRA-D 12 HOUR) 60-120 MG per tablet Take 1 tablet by mouth 2 (two) times daily.        Marland Kitchen FLUTICASONE FUROATE NA by Nasal route as needed.        . hyoscyamine (LEVSIN SL) 0.125 MG SL tablet Chew finely and swallow q 2 hours prn flare of IBS  60 tablet  5  . norethindrone-ethinyl estradiol (BALZIVA) 0.4-35 MG-MCG tablet Take 1 tablet by mouth daily.      Marland Kitchen omeprazole (PRILOSEC) 40 MG capsule Take 1 capsule (40 mg total) by mouth daily.  30 capsule  11  . PROAIR HFA 108 (90 BASE) MCG/ACT inhaler INHALE TWO PUFFS BY MOUTH FOUR TIMES DAILY AS NEEDED  1 Inhaler  5  . Sodium Chloride-Sodium Bicarb (SINUS WASH NETI POT NA) by Nasal route.        . SYMBICORT 160-4.5 MCG/ACT inhaler INHALE TWO PUFFS BY MOUTH TWICE DAILY  1 Inhaler  9  . DISCONTD: metFORMIN (GLUCOPHAGE) 500 MG tablet Take 500 mg by mouth 2 (two) times daily with a meal.            Review of Systems System review is negative for any constitutional, cardiac, pulmonary, GI or neuro symptoms or complaints other than as described in the HPI.     Objective:   Physical Exam Filed Vitals:   06/21/12 1135  BP: 100/68    Pulse: 69  Temp: 98.4 F (36.9 C)  Resp: 16   Gen'l- WNWD woman in no distress HEENT - C&S clear Derm - boil w/ approx 3 cm area of induration right face. No frank purulent drainage. Warm and tender to touch.       Assessment & Plan:

## 2012-07-04 ENCOUNTER — Emergency Department (HOSPITAL_COMMUNITY): Payer: BC Managed Care – PPO

## 2012-07-04 ENCOUNTER — Encounter (HOSPITAL_COMMUNITY): Payer: Self-pay | Admitting: Emergency Medicine

## 2012-07-04 ENCOUNTER — Emergency Department (HOSPITAL_COMMUNITY)
Admission: EM | Admit: 2012-07-04 | Discharge: 2012-07-04 | Disposition: A | Discharge status: Home / Self Care | Payer: BC Managed Care – PPO | Attending: Emergency Medicine | Admitting: Emergency Medicine

## 2012-07-04 DIAGNOSIS — S0083XA Contusion of other part of head, initial encounter: Secondary | ICD-10-CM

## 2012-07-04 DIAGNOSIS — IMO0002 Reserved for concepts with insufficient information to code with codable children: Secondary | ICD-10-CM | POA: Insufficient documentation

## 2012-07-04 DIAGNOSIS — H1130 Conjunctival hemorrhage, unspecified eye: Secondary | ICD-10-CM | POA: Insufficient documentation

## 2012-07-04 DIAGNOSIS — S0081XA Abrasion of other part of head, initial encounter: Secondary | ICD-10-CM

## 2012-07-04 DIAGNOSIS — S1093XA Contusion of unspecified part of neck, initial encounter: Secondary | ICD-10-CM | POA: Insufficient documentation

## 2012-07-04 DIAGNOSIS — R220 Localized swelling, mass and lump, head: Secondary | ICD-10-CM | POA: Insufficient documentation

## 2012-07-04 DIAGNOSIS — S0990XA Unspecified injury of head, initial encounter: Secondary | ICD-10-CM

## 2012-07-04 DIAGNOSIS — S0003XA Contusion of scalp, initial encounter: Secondary | ICD-10-CM | POA: Insufficient documentation

## 2012-07-04 LAB — CBC WITH DIFFERENTIAL/PLATELET
Eosinophils Absolute: 0.6 10*3/uL (ref 0.0–0.7)
Eosinophils Relative: 4 % (ref 0–5)
Hemoglobin: 14.8 g/dL (ref 12.0–15.0)
Lymphs Abs: 3.4 10*3/uL (ref 0.7–4.0)
MCH: 32 pg (ref 26.0–34.0)
MCHC: 36.2 g/dL — ABNORMAL HIGH (ref 30.0–36.0)
MCV: 88.3 fL (ref 78.0–100.0)
Monocytes Absolute: 0.6 10*3/uL (ref 0.1–1.0)
Monocytes Relative: 4 % (ref 3–12)
RBC: 4.63 MIL/uL (ref 3.87–5.11)

## 2012-07-04 LAB — BASIC METABOLIC PANEL
BUN: 10 mg/dL (ref 6–23)
CO2: 16 mEq/L — ABNORMAL LOW (ref 19–32)
Calcium: 9.3 mg/dL (ref 8.4–10.5)
Glucose, Bld: 199 mg/dL — ABNORMAL HIGH (ref 70–99)
Potassium: 3.4 mEq/L — ABNORMAL LOW (ref 3.5–5.1)
Sodium: 139 mEq/L (ref 135–145)

## 2012-07-04 LAB — URINALYSIS, ROUTINE W REFLEX MICROSCOPIC
Bilirubin Urine: NEGATIVE
Glucose, UA: 100 mg/dL — AB
Ketones, ur: 15 mg/dL — AB
Leukocytes, UA: NEGATIVE
pH: 5.5 (ref 5.0–8.0)

## 2012-07-04 LAB — URINE MICROSCOPIC-ADD ON

## 2012-07-04 MED ORDER — FENTANYL CITRATE 0.05 MG/ML IJ SOLN
INTRAMUSCULAR | Status: AC
  Administered 2012-07-04: 12.5 ug
  Filled 2012-07-04: qty 2

## 2012-07-04 MED ORDER — HYDROCODONE-ACETAMINOPHEN 7.5-500 MG/15ML PO SOLN: 15.0000 mL | Freq: Four times a day (QID) | ORAL | Status: AC | PRN

## 2012-07-04 MED ORDER — TETANUS-DIPHTHERIA TOXOIDS TD 5-2 LFU IM INJ: 0.5000 mL | INJECTION | Freq: Once | INTRAMUSCULAR | Status: DC

## 2012-07-04 MED ORDER — FENTANYL CITRATE 0.05 MG/ML IJ SOLN
12.5000 ug | INTRAMUSCULAR | Status: AC | PRN
  Administered 2012-07-04 (×2): 12.5 ug via INTRAVENOUS
  Filled 2012-07-04 (×2): qty 2

## 2012-07-04 MED ORDER — ONDANSETRON HCL 4 MG/2ML IJ SOLN
4.0000 mg | Freq: Once | INTRAMUSCULAR | Status: AC
  Administered 2012-07-04: 4 mg via INTRAVENOUS
  Filled 2012-07-04: qty 2

## 2012-07-04 MED ORDER — SODIUM CHLORIDE 0.9 % IV SOLN
INTRAVENOUS | Status: DC
  Administered 2012-07-04: 05:00:00 via INTRAVENOUS

## 2012-07-04 MED ORDER — ONDANSETRON HCL 4 MG PO TABS: 4.0000 mg | ORAL_TABLET | Freq: Three times a day (TID) | ORAL | Status: AC | PRN

## 2012-07-04 MED ORDER — TETANUS-DIPHTH-ACELL PERTUSSIS 5-2.5-18.5 LF-MCG/0.5 IM SUSP
0.5000 mL | Freq: Once | INTRAMUSCULAR | Status: AC
  Administered 2012-07-04: 0.5 mL via INTRAMUSCULAR
  Filled 2012-07-04: qty 0.5

## 2012-07-04 NOTE — ED Notes (Signed)
Laceration 1/2 in on bridge of nose Abrasion/hematoma on right forehead Abrasions to right knee Right facial jaw swellling
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
## 2012-07-04 NOTE — ED Notes (Signed)
NURSE ASSISTED PT'S PARENT TO WAITING LOUNGE AT [facility] WHILE WAITING FOR DISPOSITION .

## 2012-07-04 NOTE — ED Notes (Signed)
Family at beside. Family given emotional support. 

## 2012-07-04 NOTE — ED Notes (Signed)
Pt transported to CT with R. Sangalang

## 2012-07-04 NOTE — ED Notes (Signed)
Registration arrived 

## 2012-07-04 NOTE — ED Notes (Signed)
ABRASIONS CLEANED WITH H2O2 , DRIED BLOOD AT FACE AND HANDS CLEANED WITH SOAPY H2O . [employer] AT BEDSIDE SPEAKING WITH PT.

## 2012-07-04 NOTE — ED Notes (Signed)
Family updated as to patient's status.
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
## 2012-07-04 NOTE — ED Notes (Signed)
DR. Clarene Duke ADVISED NURSE THAT PT. CAN HAVE CT SCAN PRIOR TO URINE PREG. TEST  - SHIELD PT. DURING SCAN , CT [facility] NOTIFIED.

## 2012-07-04 NOTE — ED Notes (Signed)
PT. SEEN AND REEVALUATED BY DR. Clarene Duke .

## 2012-07-05 ENCOUNTER — Telehealth: Payer: Self-pay | Admitting: Internal Medicine

## 2012-07-05 ENCOUNTER — Encounter: Payer: Self-pay | Admitting: Internal Medicine

## 2012-07-05 ENCOUNTER — Ambulatory Visit (INDEPENDENT_AMBULATORY_CARE_PROVIDER_SITE_OTHER): Payer: BC Managed Care – PPO | Admitting: Internal Medicine

## 2012-07-05 DIAGNOSIS — F4310 Post-traumatic stress disorder, unspecified: Secondary | ICD-10-CM

## 2012-07-05 MED ORDER — ALPRAZOLAM 0.5 MG PO TBDP: 0.5000 mg | ORAL_TABLET | Freq: Four times a day (QID) | ORAL | Status: AC | PRN

## 2012-07-05 NOTE — Patient Instructions (Addendum)
Post-traumatic stress - your physical wounds will heal although you will have some significant bruising. You need some short term counseling to help figure out all the emotional ramifications. I have put in a request for appointment with either Judithe Modest, MSW or Caralyn Guile, PhD at [facility] - in the Annex behind this building. If you do not hear from [facility] about an appointment today or by mid-day tomorrow let me know.  Continue the Wellbutrin. Take alprazolam (Xanax) every 6 hours as needed for trouble sleeping or waves of anxiety.  For pain and discomfort - take Tylenol 1,000 mg three times a day; take alev 2 tablets twice a day. If you must take the vicodin - please remember that it contains tylenol and take this into account so that you do not take more than 3,000 mg total per 24 hrs or tylenol.   Post-Traumatic Stress Disorder If you have been diagnosed with post-traumatic stress disorder (PTSD), you have probably experienced a traumatic event in your life. These events are usually outside of the range of normal human experience and would negatively impact any normal person.   CAUSES   A person can get PTSD after living through or seeing a dangerous event such as:  An automobile accident.   War.   Natural disaster.   Rape.   Domestic violence.   Any event where there has been a threat to life.  PTSD is a real illness. PTSD Can happen to anyone at any age. Children get PTSD too. A doctor, or mental health professional with experience in treating PTSD can help you. SYMPTOMS   Not all symptoms may be present in any one person.  Distressing dreams.   Flashback: feeling the frightening event is happening again.   Avoiding activities, places, and people that remind you of the event.   Avoiding thoughts and feelings associated with the event.   Having frightening thoughts you cannot control.   Feeling on the edge with increased alertness and vigilance.    Trouble sleeping.   Feeling alone, detached from others.   Angry outbursts.   Feeling worried, guilty, or sad.   Having thoughts of hurting yourself or others.  PTSD may start soon after a frightening event or months or years later. Many war veterans have PTSD. Drinking alcohol or using drugs will not help PTSD and may even make it worse.   TREATMENT   PTSD can be treated. Treatment may include "talk" therapy, medicine, or both. Either a doctor or a mental health professional who is experienced in treating PTSD can help you. Early diagnosis and treatment is best and can show more rapid improvement. Get help if you or a loved one are thinking of hurting yourself. Call your local emergency medical services if you need help immediately. Document Released: 07/22/2001 Document Revised: 10/16/2011 Document Reviewed: 07/05/2008 [facility] Patient Information 2012 [place], [place].
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
## 2012-07-05 NOTE — Telephone Encounter (Signed)
ERROR
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
## 2012-07-05 NOTE — Telephone Encounter (Signed)
All ready added for today

## 2012-07-06 ENCOUNTER — Ambulatory Visit (INDEPENDENT_AMBULATORY_CARE_PROVIDER_SITE_OTHER): Payer: BC Managed Care – PPO | Admitting: Psychology

## 2012-07-06 DIAGNOSIS — F4310 Post-traumatic stress disorder, unspecified: Secondary | ICD-10-CM

## 2012-07-16 ENCOUNTER — Ambulatory Visit (INDEPENDENT_AMBULATORY_CARE_PROVIDER_SITE_OTHER): Payer: BC Managed Care – PPO | Admitting: Psychology

## 2012-07-16 DIAGNOSIS — F4310 Post-traumatic stress disorder, unspecified: Secondary | ICD-10-CM

## 2012-07-21 ENCOUNTER — Ambulatory Visit: Payer: BC Managed Care – PPO | Admitting: Psychology

## 2012-07-29 ENCOUNTER — Ambulatory Visit (INDEPENDENT_AMBULATORY_CARE_PROVIDER_SITE_OTHER): Payer: BC Managed Care – PPO | Admitting: Psychology

## 2012-07-29 DIAGNOSIS — F4310 Post-traumatic stress disorder, unspecified: Secondary | ICD-10-CM

## 2012-07-30 ENCOUNTER — Encounter: Payer: Self-pay | Admitting: Endocrinology

## 2012-07-30 ENCOUNTER — Ambulatory Visit (INDEPENDENT_AMBULATORY_CARE_PROVIDER_SITE_OTHER): Payer: BC Managed Care – PPO | Admitting: Endocrinology

## 2012-07-30 VITALS — BP 114/78 | HR 85 | Temp 99.8°F | Resp 16 | Wt 156.2 lb

## 2012-07-30 DIAGNOSIS — J069 Acute upper respiratory infection, unspecified: Secondary | ICD-10-CM

## 2012-07-30 MED ORDER — CEFUROXIME AXETIL 250 MG PO TABS: 250.0000 mg | ORAL_TABLET | Freq: Two times a day (BID) | ORAL | Status: AC

## 2012-07-30 MED ORDER — PROMETHAZINE-CODEINE 6.25-10 MG/5ML PO SYRP: 5.0000 mL | ORAL_SOLUTION | ORAL | Status: AC | PRN

## 2012-07-30 NOTE — Patient Instructions (Addendum)
i have sent a prescription to your pharmacy, for an antibiotic pill Loratadine-d (non-prescription) will help your congestion. I hope you feel better soon.  If you don't feel better by next week, please call back.   Here is a prescription for cough syrup.

## 2012-07-30 NOTE — Progress Notes (Signed)
Subjective:    Patient ID: Brenda Rosales, female    DOB: [birth year], 26 y.o.   MRN: [id number]  HPI Pt states few days of moderate congestion in both ears, and assoc prod-quality cough.   Past Medical History  Diagnosis Date  . ASTHMA 11/19/2007  . CHICKENPOX 11/19/2007  . Dysmenorrhea 11/19/2007  . DYSPHAGIA 11/19/2010  . ECZEMA 11/19/2007  . FATTY LIVER DISEASE 04/17/2008  . GERD 11/20/2010  . Infection of eyelid 08/07/2011  . RHINOSINUSITIS, CHRONIC 12/09/2007  . TB SKIN TEST, POSITIVE 11/19/2007  . Esophageal stricture   . Esophagitis   . Duodenitis     Past Surgical History  Procedure Date  . Mole excision     x's 4  . Ct sinus ltd w/o cm 09/23/07    History   Social History  . Marital Status: Single    Spouse Name: N/A    Number of Children: N/A  . Years of Education: N/A   Occupational History  . Teacher    Social History Main Topics  . Smoking status: Never Smoker   . Smokeless tobacco: Never Used  . Alcohol Use: Yes  . Drug Use: No  . Sexually Active: Not on file   Other Topics Concern  . Not on file   Social History Narrative   Adopted from [place]. 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 ALPRAZolam (NIRAVAM) 0.5 MG dissolvable tablet Take 1 tablet (0.5 mg total) by mouth every 6 (six) hours as needed for anxiety.  30 tablet  0  . buPROPion (WELLBUTRIN XL) 150 MG 24 hr tablet Take 1 tablet (150 mg total) by mouth every morning.  30 tablet  5  . fexofenadine-pseudoephedrine (ALLEGRA-D 24) 180-240 MG per 24 hr tablet Take 1 tablet by mouth daily.      . hyoscyamine (LEVSIN SL) 0.125 MG SL tablet Chew finely and swallow q 2 hours prn flare of IBS  60 tablet  5  . loratadine (CLARITIN) 10 MG tablet Take 10 mg by mouth daily as needed. allergies      . norethindrone-ethinyl estradiol (BALZIVA) 0.4-35 MG-MCG tablet Take 1  tablet by mouth daily.      Marland Kitchen omeprazole (PRILOSEC) 40 MG capsule Take 1 capsule (40 mg total) by mouth daily.  30 capsule  11  . PROAIR HFA 108 (90 BASE) MCG/ACT inhaler INHALE TWO PUFFS BY MOUTH FOUR TIMES DAILY AS NEEDED  1 Inhaler  5  . Sodium Chloride-Sodium Bicarb (SINUS WASH NETI POT NA) by Nasal route.        . SYMBICORT 160-4.5 MCG/ACT inhaler INHALE TWO PUFFS BY MOUTH TWICE DAILY  1 Inhaler  9  . DISCONTD: metFORMIN (GLUCOPHAGE) 500 MG tablet Take 500 mg by mouth 2 (two) times daily with a meal.          No Known Allergies  Family History  Problem Relation Age of Onset  . Adopted: Yes    BP 114/78  Pulse 85  Temp 99.8 F (37.7 C) (Oral)  Resp 16  Wt 156 lb 4 oz (70.875 kg)  SpO2 97%  LMP 06/12/2012   Review of Systems Denies sob, but she has slight wheezing    Objective:   Physical Exam VITAL SIGNS:  See vs page GENERAL: no distress head: no deformity eyes: no periorbital swelling, no proptosis external nose and ears are normal mouth: no  lesion seen Both tm's are very red LUNGS:  Clear to auscultation      Assessment & Plan:  URI, new

## 2012-08-23 ENCOUNTER — Ambulatory Visit: Payer: BC Managed Care – PPO | Admitting: Psychology

## 2012-08-31 ENCOUNTER — Encounter: Payer: Self-pay | Admitting: Internal Medicine

## 2012-09-06 ENCOUNTER — Ambulatory Visit (INDEPENDENT_AMBULATORY_CARE_PROVIDER_SITE_OTHER): Payer: BC Managed Care – PPO | Admitting: Psychology

## 2012-09-06 DIAGNOSIS — F4310 Post-traumatic stress disorder, unspecified: Secondary | ICD-10-CM

## 2012-09-21 ENCOUNTER — Ambulatory Visit (INDEPENDENT_AMBULATORY_CARE_PROVIDER_SITE_OTHER): Payer: BC Managed Care – PPO | Admitting: Psychology

## 2012-09-21 DIAGNOSIS — F4310 Post-traumatic stress disorder, unspecified: Secondary | ICD-10-CM

## 2012-10-18 ENCOUNTER — Ambulatory Visit (INDEPENDENT_AMBULATORY_CARE_PROVIDER_SITE_OTHER): Payer: BC Managed Care – PPO | Admitting: Internal Medicine

## 2012-10-18 ENCOUNTER — Encounter: Payer: Self-pay | Admitting: Internal Medicine

## 2012-10-18 VITALS — BP 116/78 | HR 83 | Temp 98.0°F | Ht 63.0 in | Wt 159.0 lb

## 2012-10-18 DIAGNOSIS — J019 Acute sinusitis, unspecified: Secondary | ICD-10-CM

## 2012-10-18 MED ORDER — FLUTICASONE PROPIONATE 50 MCG/ACT NA SUSP: 2.0000 | Freq: Every day | NASAL | Status: DC

## 2012-10-18 MED ORDER — AMOXICILLIN-POT CLAVULANATE 875-125 MG PO TABS: 1.0000 | ORAL_TABLET | Freq: Two times a day (BID) | ORAL | Status: DC

## 2012-10-18 NOTE — Patient Instructions (Signed)

## 2012-10-18 NOTE — Progress Notes (Signed)
HPI  Pt presents to the clinic today with c/o sinus pain and pressure. This has been going on for 14 days. She took 10 days of sudafed without relief. The headache and facial pain are getting worse. She also c/o ear fullness. She does have a history of recurrent sinus infections. She denies fever, chills, nausea and vomiting.   Review of Systems    Past Medical History  Diagnosis Date  . ASTHMA 11/19/2007  . CHICKENPOX 11/19/2007  . Dysmenorrhea 11/19/2007  . DYSPHAGIA 11/19/2010  . ECZEMA 11/19/2007  . FATTY LIVER DISEASE 04/17/2008  . GERD 11/20/2010  . Infection of eyelid 08/07/2011  . RHINOSINUSITIS, CHRONIC 12/09/2007  . TB SKIN TEST, POSITIVE 11/19/2007  . Esophageal stricture   . Esophagitis   . Duodenitis     Family History  Problem Relation Age of Onset  . Adopted: Yes    History   Social History  . Marital Status: Single    Spouse Name: N/A    Number of Children: N/A  . Years of Education: N/A   Occupational History  . Teacher    Social History Main Topics  . Smoking status: Never Smoker   . Smokeless tobacco: Never Used  . Alcohol Use: Yes  . Drug Use: No  . Sexually Active: Not on file   Other Topics Concern  . Not on file   Social History Narrative   Adopted from [place]. 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 Denies abrupt weight changes.  HEENT:  Positive eye pain, pressure behind the eyes, facial pain, nasal congestion and sore throat. Denies eye redness, ear pain, ringing in the ears, wax buildup, runny nose or bloody nose. Respiratory: Positive cough and thick green sputum production. Denies difficulty breathing or shortness of breath.  Cardiovascular: Denies chest pain, chest tightness, palpitations or swelling in the hands or feet.   No other specific complaints in a complete review of systems (except as listed in HPI  above).  Objective:    General: Appears her stated age, well developed, well nourished in NAD. HEENT: Head: normal shape and size; Eyes: sclera white, no icterus, conjunctiva pink, PERRLA and EOMs intact; Ears: Tm's gray and intact, normal light reflex; Nose: mucosa pink and moist, septum midline; Throat/Mouth: + PND. Teeth present, mucosa pink and moist, no exudate noted, no lesions or ulcerations noted.  Neck: Mild cervical lymphadenopathy. Neck supple, trachea midline. No massses, lumps or thyromegaly present.  Cardiovascular: Normal rate and rhythm. S1,S2 noted.  No murmur, rubs or gallops noted. No JVD or BLE edema. No carotid bruits noted. Pulmonary/Chest: Normal effort and positive vesicular breath sounds. No respiratory distress. No wheezes, rales or ronchi noted.      Assessment & Plan:   Acute bacterial sinusitis  Can use a Neti Pot which can be purchased from your local drug store. Flonase 2 sprays each nostril for 3 days and then as needed. Augmentin BID for 10 days  RTC as needed or if symptoms persist.

## 2012-11-11 ENCOUNTER — Ambulatory Visit (INDEPENDENT_AMBULATORY_CARE_PROVIDER_SITE_OTHER): Payer: BC Managed Care – PPO | Admitting: Internal Medicine

## 2012-11-11 ENCOUNTER — Encounter: Payer: Self-pay | Admitting: Internal Medicine

## 2012-11-11 VITALS — BP 104/70 | HR 100 | Temp 98.9°F | Resp 10 | Wt 157.1 lb

## 2012-11-11 DIAGNOSIS — J328 Other chronic sinusitis: Secondary | ICD-10-CM

## 2012-11-11 MED ORDER — LEVOFLOXACIN 500 MG PO TABS: 500.0000 mg | ORAL_TABLET | Freq: Every day | ORAL | Status: DC

## 2012-11-11 NOTE — Progress Notes (Signed)
  Subjective:    Patient ID: Brenda Rosales, female    DOB: [birth year], 27 y.o.   MRN: [id number]  HPI Seen December 19tjh by Ms Maree Krabbe for sinus infection treated with augmentin for 10 days. Symptoms did not completely resolve and she is now having recurrent sinus congestion, drainage and typical infectious symptoms. There is a history of allergy, asthma and recurrent sinusitis. She has not had any respiratory distress, no fevers or chills.  PMH, FamHx and SocHx reviewed for any changes and relevance. Current Outpatient Prescriptions on File Prior to Visit  Medication Sig Dispense Refill  . buPROPion (WELLBUTRIN XL) 150 MG 24 hr tablet Take 1 tablet (150 mg total) by mouth every morning.  30 tablet  5  . fexofenadine-pseudoephedrine (ALLEGRA-D 24) 180-240 MG per 24 hr tablet Take 1 tablet by mouth daily.      . fluticasone (FLONASE) 50 MCG/ACT nasal spray Place 2 sprays into the nose daily.  16 g  6  . hyoscyamine (LEVSIN SL) 0.125 MG SL tablet Chew finely and swallow q 2 hours prn flare of IBS  60 tablet  5  . norethindrone-ethinyl estradiol (BALZIVA) 0.4-35 MG-MCG tablet Take 1 tablet by mouth daily.      Marland Kitchen omeprazole (PRILOSEC) 40 MG capsule Take 1 capsule (40 mg total) by mouth daily.  30 capsule  11  . PROAIR HFA 108 (90 BASE) MCG/ACT inhaler INHALE TWO PUFFS BY MOUTH FOUR TIMES DAILY AS NEEDED  1 Inhaler  5  . Sodium Chloride-Sodium Bicarb (SINUS WASH NETI POT NA) by Nasal route.        . SYMBICORT 160-4.5 MCG/ACT inhaler INHALE TWO PUFFS BY MOUTH TWICE DAILY  1 Inhaler  9  . amoxicillin-clavulanate (AUGMENTIN) 875-125 MG per tablet Take 1 tablet by mouth 2 (two) times daily.  20 tablet  0  . [DISCONTINUED] metFORMIN (GLUCOPHAGE) 500 MG tablet Take 500 mg by mouth 2 (two) times daily with a meal.              Review of Systems System review is negative for any constitutional, cardiac, pulmonary, GI or neuro symptoms or complaints other than as described in the HPI.       Objective:   Physical Exam Filed Vitals:   11/11/12 1634  BP: 104/70  Pulse: 100  Temp: 98.9 F (37.2 C)  Resp: 10   Gen'l- WNWD woman in no distress HEENT- TM's normal, tenderness to percussion over the frontal and maxillary sinuses. Cor- RRR Pulm - normal respirations, no increased WOB, no rales or wheezes.        Assessment & Plan:

## 2012-11-11 NOTE — Patient Instructions (Addendum)
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 This is inflammation of the jaw-face joint. Plan Small bites, don't over-extend the jaw  Aspercreme -rub over the outside of the joint several times a day  Etodolac 500 mg twice a day - a prescription strength anti-inflammatory drug to help with the acute inflammation at the joint  Ask you dentist to check your bite and to look for signs of bruxism - grinding or clenching your teeth.

## 2012-11-14 NOTE — Assessment & Plan Note (Signed)
Recurrent vs persistent sinus infection after treatment Dec 19th with augmentin for 10 days.  Plan Levaquin 500 mg qd x 7 days  Usual supportive care.

## 2012-11-16 ENCOUNTER — Other Ambulatory Visit: Payer: Self-pay | Admitting: Internal Medicine

## 2012-11-17 ENCOUNTER — Other Ambulatory Visit: Payer: Self-pay | Admitting: *Deleted

## 2012-11-17 MED ORDER — ALPRAZOLAM 0.5 MG PO TBDP: ORAL_TABLET | ORAL | Status: DC

## 2012-11-20 ENCOUNTER — Telehealth: Payer: Self-pay | Admitting: Internal Medicine

## 2012-11-20 MED ORDER — GENTAMICIN SULFATE 0.3 % OP SOLN: 2.0000 [drp] | OPHTHALMIC | Status: DC

## 2012-11-20 NOTE — Telephone Encounter (Signed)
Via text msging: red eyes with matting, swelling, mild discomfort w/o visual loss or change. View "eye selfie" - appears c/w conjuntivitis.  Plan - gentamicin ophthalmic 2qtts q 4 hours  She is to call for worsening  She is to come by the office Monday for follow-up

## 2012-11-24 ENCOUNTER — Encounter: Payer: Self-pay | Admitting: Internal Medicine

## 2012-11-24 ENCOUNTER — Ambulatory Visit (INDEPENDENT_AMBULATORY_CARE_PROVIDER_SITE_OTHER): Payer: BC Managed Care – PPO | Admitting: Internal Medicine

## 2012-11-24 VITALS — BP 110/68 | HR 63 | Temp 98.6°F | Resp 10 | Wt 161.0 lb

## 2012-11-24 DIAGNOSIS — L259 Unspecified contact dermatitis, unspecified cause: Secondary | ICD-10-CM

## 2012-11-24 DIAGNOSIS — H109 Unspecified conjunctivitis: Secondary | ICD-10-CM

## 2012-11-24 NOTE — Progress Notes (Signed)
Subjective:    Patient ID: Brenda Rosales, female    DOB: April 22, 1986, 27 y.o.   MRN: [id number]  HPI Ms. Tabar - called over the weekend with symptoms and appearance of bulbar conjunctivitis and was started on garamycin 2 qtts q 4. She presents for follow up.  The eye looks better but she is have a significant flare of eczema and there is swelling and fissuring of the medial aspect of the eyelids, diffuse bodywide rash and a small furuncle right elbow. She is already on allegra 180.  Past Medical History  Diagnosis Date  . ASTHMA 11/19/2007  . CHICKENPOX 11/19/2007  . Dysmenorrhea 11/19/2007  . DYSPHAGIA 11/19/2010  . ECZEMA 11/19/2007  . FATTY LIVER DISEASE 04/17/2008  . GERD 11/20/2010  . Infection of eyelid 08/07/2011  . RHINOSINUSITIS, CHRONIC 12/09/2007  . TB SKIN TEST, POSITIVE 11/19/2007  . Esophageal stricture   . Esophagitis   . Duodenitis    Past Surgical History  Procedure Date  . Mole excision     x's 4  . Ct sinus ltd w/o cm 09/23/07   Family History  Problem Relation Age of Onset  . Adopted: Yes   History   Social History  . Marital Status: Single    Spouse Name: N/A    Number of Children: N/A  . Years of Education: N/A   Occupational History  . Teacher    Social History Main Topics  . Smoking status: Never Smoker   . Smokeless tobacco: Never Used  . Alcohol Use: Yes  . Drug Use: No  . Sexually Active: Not on file   Other Topics Concern  . Not on file   Social History Narrative   Adopted from [place]. 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 ALPRAZolam (NIRAVAM) 0.5 MG dissolvable tablet TAKE 1 TABLET BY MOUTH EVERY 6 HOURS AS NEEDED FOR ANXIETY  30 tablet  0  . ALPRAZolam (NIRAVAM) 0.5 MG dissolvable tablet Take 1 (one) tablet by mouth every 6 (six) hours as needed for anxiety.  30 tablet  0  .  amoxicillin-clavulanate (AUGMENTIN) 875-125 MG per tablet Take 1 tablet by mouth 2 (two) times daily.  20 tablet  0  . buPROPion (WELLBUTRIN XL) 150 MG 24 hr tablet Take 1 tablet (150 mg total) by mouth every morning.  30 tablet  5  . fexofenadine-pseudoephedrine (ALLEGRA-D 24) 180-240 MG per 24 hr tablet Take 1 tablet by mouth daily.      . fluticasone (FLONASE) 50 MCG/ACT nasal spray Place 2 sprays into the nose daily.  16 g  6  . gentamicin (GARAMYCIN) 0.3 % ophthalmic solution Place 2 drops into both eyes every 4 (four) hours.  5 mL  1  . hyoscyamine (LEVSIN SL) 0.125 MG SL tablet Chew finely and swallow q 2 hours prn flare of IBS  60 tablet  5  . levofloxacin (LEVAQUIN) 500 MG tablet Take 1 tablet (500 mg total) by mouth daily.  7 tablet  0  . norethindrone-ethinyl estradiol (BALZIVA) 0.4-35 MG-MCG tablet Take 1 tablet by mouth daily.      Marland Kitchen omeprazole (PRILOSEC) 40 MG capsule Take 1 capsule (40 mg total) by mouth daily.  30 capsule  11  . PROAIR HFA 108 (90 BASE) MCG/ACT inhaler INHALE TWO PUFFS BY MOUTH FOUR TIMES DAILY AS NEEDED  1 Inhaler  5  . Sodium Chloride-Sodium  Bicarb (SINUS WASH NETI POT NA) by Nasal route.        . SYMBICORT 160-4.5 MCG/ACT inhaler INHALE TWO PUFFS BY MOUTH TWICE DAILY  1 Inhaler  9  . [DISCONTINUED] metFORMIN (GLUCOPHAGE) 500 MG tablet Take 500 mg by mouth 2 (two) times daily with a meal.            Review of Systems System review is negative for any constitutional, cardiac, pulmonary, GI or neuro symptoms or complaints other than as described in the HPI.     Objective:   Physical Exam Filed Vitals:   11/24/12 1706  BP: 110/68  Pulse: 63  Temp: 98.6 F (37 C)  Resp: 10   gen'l - WNWD woman in no distress HEENT - bulbar conjunctive and palpebral conjunctiva appear clear but the eye lids are swollen and cracked Pulm - normal respirations Derm - diffuse erythematous rash, cracked eyelids       Assessment & Plan:  Conjunctivitis - improving Plan  - continue Garamycin drops.

## 2012-11-24 NOTE — Patient Instructions (Addendum)
Conjunctivitis - the eyeball and the lids do not look particularly infected Plan Complete a full week of garamycin drops  Eczema - a whopper of an outbreak. Plan  See Dr. Terri Piedra or his staff for treatment  For itching - continue allegra 180mg  daily; add zantac (ranitidine) 150 mg AM and PM; aveeno baths.

## 2012-11-25 NOTE — Assessment & Plan Note (Signed)
Major flare with fissuring of upper eye lids, diffuse rash body wide with intense pruritis  Plan For itching continue allegra, add Ranitidine 150 mg bid  See Dr. Terri Piedra ASAP

## 2012-11-30 ENCOUNTER — Telehealth: Payer: Self-pay | Admitting: Internal Medicine

## 2012-11-30 DIAGNOSIS — A4902 Methicillin resistant Staphylococcus aureus infection, unspecified site: Secondary | ICD-10-CM

## 2012-11-30 NOTE — Telephone Encounter (Signed)
Pt has been going to Dr. Terri Piedra for dermatology and being treated for MRSA.  She has gone through several rounds of antibiotics.  She doesn't feel like it is getting better and wants to know who else she should see for this.

## 2012-11-30 NOTE — Telephone Encounter (Signed)
The next step will be referral to ID. [facility] notified

## 2012-12-01 NOTE — Telephone Encounter (Signed)
Pt is aware.  

## 2012-12-08 ENCOUNTER — Ambulatory Visit (INDEPENDENT_AMBULATORY_CARE_PROVIDER_SITE_OTHER): Payer: BC Managed Care – PPO | Admitting: Infectious Disease

## 2012-12-08 ENCOUNTER — Encounter: Payer: Self-pay | Admitting: Infectious Disease

## 2012-12-08 VITALS — BP 134/78 | HR 79 | Temp 98.1°F | Ht 63.0 in | Wt 159.0 lb

## 2012-12-08 DIAGNOSIS — A150 Tuberculosis of lung: Secondary | ICD-10-CM

## 2012-12-08 DIAGNOSIS — Z227 Latent tuberculosis: Secondary | ICD-10-CM

## 2012-12-08 DIAGNOSIS — Z113 Encounter for screening for infections with a predominantly sexual mode of transmission: Secondary | ICD-10-CM

## 2012-12-08 DIAGNOSIS — A4902 Methicillin resistant Staphylococcus aureus infection, unspecified site: Secondary | ICD-10-CM

## 2012-12-08 MED ORDER — MUPIROCIN 2 % EX OINT: TOPICAL_OINTMENT | Freq: Two times a day (BID) | CUTANEOUS | Status: DC

## 2012-12-08 MED ORDER — CHLORHEXIDINE GLUCONATE 4 % EX LIQD: 60.0000 mL | CUTANEOUS | Status: DC

## 2012-12-08 MED ORDER — DOXYCYCLINE HYCLATE 100 MG PO CAPS: 100.0000 mg | ORAL_CAPSULE | Freq: Two times a day (BID) | ORAL | Status: DC

## 2012-12-08 MED ORDER — CHLORHEXIDINE GLUCONATE 4 % EX LIQD: 1.0000 "application " | CUTANEOUS | Status: DC

## 2012-12-08 NOTE — Progress Notes (Signed)
  Subjective:    Patient ID: Brenda Rosales, female    DOB: 06-03-1986, 27 y.o.   MRN: [id number]  HPI  27 year old Hispanic lady with hx of eczema, dry skin and recurrent MRSA infections. MRSA boils have occurred on arms, legs and face typically draining with warm compresses. She has not been on systemic abx for more than 10 days by her account. She has boyfriend and lives with parents but main source of intimate contact is boyfriend who does not have overt MRSA infection. She has resolving infection in left posterior knee at present and is not systemiccally ill. I advised course of doxy for one month and decoloniation regimen for her and her boyfriend in synchrony..  Review of Systems  Constitutional: Negative for fever, chills, diaphoresis, activity change, appetite change, fatigue and unexpected weight change.  HENT: Negative for congestion, sore throat, rhinorrhea, sneezing, trouble swallowing and sinus pressure.   Eyes: Negative for photophobia and visual disturbance.  Respiratory: Negative for cough, chest tightness, shortness of breath, wheezing and stridor.   Cardiovascular: Negative for chest pain, palpitations and leg swelling.  Gastrointestinal: Negative for nausea, vomiting, abdominal pain, diarrhea, constipation, blood in stool, abdominal distention and anal bleeding.  Genitourinary: Negative for dysuria, hematuria, flank pain and difficulty urinating.  Musculoskeletal: Negative for myalgias, back pain, joint swelling, arthralgias and gait problem.  Skin: Positive for rash. Negative for color change, pallor and wound.  Neurological: Negative for dizziness, tremors, weakness and light-headedness.  Hematological: Negative for adenopathy. Does not bruise/bleed easily.  Psychiatric/Behavioral: Negative for behavioral problems, confusion, sleep disturbance, dysphoric mood, decreased concentration and agitation.       Objective:   Physical Exam  Constitutional: She is oriented to  person, place, and time. She appears well-developed and well-nourished. No distress.  HENT:  Head: Normocephalic and atraumatic.  Mouth/Throat: Oropharynx is clear and moist. No oropharyngeal exudate.  Eyes: Conjunctivae normal and EOM are normal. No scleral icterus.  Neck: Normal range of motion. Neck supple.  Cardiovascular: Normal rate and regular rhythm.   Pulmonary/Chest: Effort normal. No respiratory distress. She has no wheezes.  Abdominal: She exhibits no distension.  Musculoskeletal: She exhibits no edema and no tenderness.  Neurological: She is alert and oriented to person, place, and time. She has normal reflexes. She exhibits normal muscle tone. Coordination normal.  Skin: Skin is warm and dry. Rash noted. She is not diaphoretic. No erythema. No pallor.     Psychiatric: She has a normal mood and affect. Her behavior is normal. Judgment and thought content normal.          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 -I will check quantiferon but she still should have rx for LTB

## 2012-12-09 LAB — HIV ANTIBODY (ROUTINE TESTING W REFLEX): HIV: NONREACTIVE

## 2012-12-14 LAB — QUANTIFERON TB GOLD ASSAY (BLOOD): Interferon Gamma Release Assay: NEGATIVE

## 2012-12-25 ENCOUNTER — Other Ambulatory Visit: Payer: Self-pay

## 2012-12-28 ENCOUNTER — Telehealth: Payer: Self-pay | Admitting: Internal Medicine

## 2012-12-28 NOTE — Telephone Encounter (Signed)
The patient called hoping to get a call back from the nurse.  She did not specifically say why she needed to the phone call.  Her call back number is [phone].  Thanks!

## 2012-12-28 NOTE — Telephone Encounter (Signed)
Called pt back and she stated that she was not feeling well, but she believes that staying home resting and sweating out her temp that she feels better. I advised if anything changed and she needed [place] to call back. Pt stated ok.

## 2013-01-06 ENCOUNTER — Encounter: Payer: Self-pay | Admitting: Infectious Disease

## 2013-01-06 ENCOUNTER — Ambulatory Visit (INDEPENDENT_AMBULATORY_CARE_PROVIDER_SITE_OTHER): Payer: BC Managed Care – PPO | Admitting: Infectious Disease

## 2013-01-06 VITALS — BP 114/80 | HR 62 | Temp 98.5°F | Ht 63.0 in | Wt 163.0 lb

## 2013-01-06 DIAGNOSIS — Z227 Latent tuberculosis: Secondary | ICD-10-CM

## 2013-01-06 DIAGNOSIS — L0292 Furuncle, unspecified: Secondary | ICD-10-CM

## 2013-01-06 DIAGNOSIS — L0293 Carbuncle, unspecified: Secondary | ICD-10-CM

## 2013-01-06 DIAGNOSIS — H01139 Eczematous dermatitis of unspecified eye, unspecified eyelid: Secondary | ICD-10-CM

## 2013-01-06 DIAGNOSIS — A4902 Methicillin resistant Staphylococcus aureus infection, unspecified site: Secondary | ICD-10-CM

## 2013-01-06 DIAGNOSIS — B9562 Methicillin resistant Staphylococcus aureus infection as the cause of diseases classified elsewhere: Secondary | ICD-10-CM

## 2013-01-06 DIAGNOSIS — Z22322 Carrier or suspected carrier of Methicillin resistant Staphylococcus aureus: Secondary | ICD-10-CM

## 2013-01-06 DIAGNOSIS — L0291 Cutaneous abscess, unspecified: Secondary | ICD-10-CM

## 2013-01-06 DIAGNOSIS — A150 Tuberculosis of lung: Secondary | ICD-10-CM

## 2013-01-06 MED ORDER — DOXYCYCLINE HYCLATE 100 MG PO CAPS: 100.0000 mg | ORAL_CAPSULE | Freq: Two times a day (BID) | ORAL | Status: DC

## 2013-01-06 NOTE — Progress Notes (Signed)
  Subjective:    Patient ID: Freda Munro, female    DOB: 1986/09/21, 27 y.o.   MRN: [id number]  HPI   27 year old Hispanic lady with hx of eczema, dry skin and recurrent MRSA infections. MRSA boils have occurred on arms, legs and face typically draining with warm compresses whom I saw approximately a monthand half  ago. We had her extend doxy or a month and she now only has one lesion on left abdomen that has drained pus and is resolving. Otherwise no new lesions. She does have eczematous lesion on her left ear   Review of Systems  Constitutional: Negative for fever, chills, diaphoresis, activity change, appetite change, fatigue and unexpected weight change.  HENT: Negative for congestion, sore throat, rhinorrhea, sneezing, trouble swallowing and sinus pressure.   Eyes: Negative for photophobia and visual disturbance.  Respiratory: Negative for cough, chest tightness, shortness of breath, wheezing and stridor.   Cardiovascular: Negative for chest pain, palpitations and leg swelling.  Gastrointestinal: Negative for nausea, vomiting, abdominal pain, diarrhea, constipation, blood in stool, abdominal distention and anal bleeding.  Genitourinary: Negative for dysuria, hematuria, flank pain and difficulty urinating.  Musculoskeletal: Negative for myalgias, back pain, joint swelling, arthralgias and gait problem.  Skin: Positive for rash. Negative for color change, pallor and wound.  Neurological: Negative for dizziness, tremors, weakness and light-headedness.  Hematological: Negative for adenopathy. Does not bruise/bleed easily.  Psychiatric/Behavioral: Negative for behavioral problems, confusion, sleep disturbance, dysphoric mood, decreased concentration and agitation.       Objective:   Physical Exam  Constitutional: She is oriented to person, place, and time. She appears well-developed and well-nourished. No distress.  HENT:  Head: Normocephalic and atraumatic.  Mouth/Throat:  Oropharynx is clear and moist. No oropharyngeal exudate.  Eyes: Conjunctivae and EOM are normal. No scleral icterus.  Neck: Normal range of motion. Neck supple.  Cardiovascular: Normal rate and regular rhythm.   Pulmonary/Chest: Effort normal. No respiratory distress. She has no wheezes.  Abdominal: She exhibits no distension.  Musculoskeletal: She exhibits no edema and no tenderness.  Neurological: She is alert and oriented to person, place, and time. She has normal reflexes. She exhibits normal muscle tone. Coordination normal.  Skin: Skin is warm and dry. Rash noted. She is not diaphoretic. No erythema. No pallor.     Psychiatric: She has a normal mood and affect. Her behavior is normal. Judgment and thought content normal.          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 -QFeron gold negative but would still rx for LTB via [facility]

## 2013-01-07 ENCOUNTER — Encounter: Payer: Self-pay | Admitting: Internal Medicine

## 2013-01-07 ENCOUNTER — Ambulatory Visit (INDEPENDENT_AMBULATORY_CARE_PROVIDER_SITE_OTHER): Payer: BC Managed Care – PPO | Admitting: Internal Medicine

## 2013-01-07 VITALS — BP 110/72 | HR 69 | Temp 98.6°F | Ht 63.0 in | Wt 162.8 lb

## 2013-01-07 DIAGNOSIS — J029 Acute pharyngitis, unspecified: Secondary | ICD-10-CM

## 2013-01-07 MED ORDER — LEVOFLOXACIN 750 MG PO TABS: 750.0000 mg | ORAL_TABLET | Freq: Every day | ORAL | Status: DC

## 2013-01-07 NOTE — Progress Notes (Signed)
HPI  Pt presents to the clinic today with c/o sore throat and bilateral ear pain. This started 1 week ago. She has tried to ride it out using home remedies and OTC but the pain seems to be getting worse. Her soon has strep throat right now. She has been running fevers as well.  Review of Systems      Past Medical History  Diagnosis Date  . ASTHMA 11/19/2007  . CHICKENPOX 11/19/2007  . Dysmenorrhea 11/19/2007  . DYSPHAGIA 11/19/2010  . ECZEMA 11/19/2007  . FATTY LIVER DISEASE 04/17/2008  . GERD 11/20/2010  . Infection of eyelid 08/07/2011  . RHINOSINUSITIS, CHRONIC 12/09/2007  . TB SKIN TEST, POSITIVE 11/19/2007  . Esophageal stricture   . Esophagitis   . Duodenitis     Family History  Problem Relation Age of Onset  . Adopted: Yes    History   Social History  . Marital Status: Single    Spouse Name: N/A    Number of Children: N/A  . Years of Education: N/A   Occupational History  . Teacher    Social History Main Topics  . Smoking status: Never Smoker   . Smokeless tobacco: Never Used  . Alcohol Use: Yes  . Drug Use: No  . Sexually Active: Not on file   Other Topics Concern  . Not on file   Social History Narrative   Adopted from [place].    College Occupational psychologist in [employer]   Lives at home wiith her parents    No Known Allergies   Constitutional: Positive headache, fatigue and fever. Denies abrupt weight changes.  HEENT:  Positive sore throat. Denies eye redness, eye pain, pressure behind the eyes, facial pain, nasal congestion, ear pain, ringing in the ears, wax buildup, runny nose or bloody nose. Respiratory: Positive cough. Denies difficulty breathing or shortness of breath.  Cardiovascular: Denies chest pain, chest tightness, palpitations or swelling in the hands or feet.   No other specific complaints in a complete review of systems (except as listed in HPI above).  Objective:   BP 110/72  Pulse 69  Temp(Src) 98.6 F (37 C) (Oral)   Ht 5\' 3"  (1.6 m)  Wt 162 lb 12.8 oz (73.846 kg)  BMI 28.85 kg/m2  SpO2 97%  LMP 01/06/2013 Wt Readings from Last 3 Encounters:  01/07/13 162 lb 12.8 oz (73.846 kg)  01/06/13 163 lb (73.936 kg)  12/08/12 159 lb (72.122 kg)     General: Appears his stated age, well developed, well nourished in NAD. HEENT: Head: normal shape and size; Eyes: sclera white, no icterus, conjunctiva pink, PERRLA and EOMs intact; Ears: Tm's gray and intact, normal light reflex; Nose: mucosa pink and moist, septum midline; Throat/Mouth: + PND. Teeth present, mucosa erythematous and moist, no exudate noted, no lesions or ulcerations noted.  Neck: Mild cervical lymphadenopathy. Neck supple, trachea midline. No massses, lumps or thyromegaly present.  Cardiovascular: Normal rate and rhythm. S1,S2 noted.  No murmur, rubs or gallops noted. No JVD or BLE edema. No carotid bruits noted. Pulmonary/Chest: Normal effort and positive vesicular breath sounds. No respiratory distress. No wheezes, rales or ronchi noted.      Assessment & Plan:   Acute pharyngitis, new onset with additional workup required:  Get some rest and drink plenty of water Do salt water gargles for the sore throat eRx for Levaquin x 7 days  RTC as needed or if symptoms persist.

## 2013-01-07 NOTE — Patient Instructions (Signed)

## 2013-03-16 ENCOUNTER — Telehealth: Payer: Self-pay | Admitting: *Deleted

## 2013-03-16 NOTE — Telephone Encounter (Signed)
Completed MRSA de-colonization.  Boils have returned.  Requesting earlier appt from 5/28 to 5/15.  Rescheduled appt.

## 2013-03-24 ENCOUNTER — Encounter: Payer: Self-pay | Admitting: Infectious Disease

## 2013-03-24 ENCOUNTER — Ambulatory Visit (INDEPENDENT_AMBULATORY_CARE_PROVIDER_SITE_OTHER): Payer: BC Managed Care – PPO | Admitting: Infectious Disease

## 2013-03-24 VITALS — BP 114/75 | HR 69 | Temp 98.4°F | Ht 63.5 in | Wt 160.0 lb

## 2013-03-24 DIAGNOSIS — A4902 Methicillin resistant Staphylococcus aureus infection, unspecified site: Secondary | ICD-10-CM

## 2013-03-24 DIAGNOSIS — H01139 Eczematous dermatitis of unspecified eye, unspecified eyelid: Secondary | ICD-10-CM

## 2013-03-24 MED ORDER — DOXYCYCLINE HYCLATE 100 MG PO CAPS: 100.0000 mg | ORAL_CAPSULE | Freq: Two times a day (BID) | ORAL | Status: DC

## 2013-03-24 NOTE — Progress Notes (Signed)
  Subjective:    Patient ID: Brenda Rosales, female    DOB: 1986-03-08, 27 y.o.   MRN: [id number]  HPI   27 year old Hispanic lady with hx of eczema, dry skin and recurrent MRSA infections. MRSA boils have occurred on arms, legs and face typically draining with warm compresses whom I seen twice this year.  We extended her doxy and had her do simultaneoius hibiclens and mupirocin with her boyfriend but lesions keep coming back every time she goes off the dox. They actually sound largely to be eczematous with dry scaling skin on her eyelids, face. She has been seeing Dr. Terri Piedra but has been dissapointed with results of topical steroids adn other local rx. I have encouraged her to re-engage with Dermatology but will also give her a protracted course of dox x 3 months.    Review of Systems  Constitutional: Negative for fever, chills, diaphoresis, activity change, appetite change, fatigue and unexpected weight change.  HENT: Negative for congestion, sore throat, rhinorrhea, sneezing, trouble swallowing and sinus pressure.   Eyes: Negative for photophobia and visual disturbance.  Respiratory: Negative for cough, chest tightness, shortness of breath, wheezing and stridor.   Cardiovascular: Negative for chest pain, palpitations and leg swelling.  Gastrointestinal: Negative for nausea, vomiting, abdominal pain, diarrhea, constipation, blood in stool, abdominal distention and anal bleeding.  Genitourinary: Negative for dysuria, hematuria, flank pain and difficulty urinating.  Musculoskeletal: Negative for myalgias, back pain, joint swelling, arthralgias and gait problem.  Skin: Positive for rash. Negative for color change, pallor and wound.  Neurological: Negative for dizziness, tremors, weakness and light-headedness.  Hematological: Negative for adenopathy. Does not bruise/bleed easily.  Psychiatric/Behavioral: Negative for behavioral problems, confusion, sleep disturbance, dysphoric mood, decreased  concentration and agitation.       Objective:   Physical Exam  Constitutional: She is oriented to person, place, and time. She appears well-developed and well-nourished. No distress.  HENT:  Head: Normocephalic and atraumatic.  Mouth/Throat: Oropharynx is clear and moist. No oropharyngeal exudate.  Eyes: Conjunctivae and EOM are normal. No scleral icterus.  Neck: Normal range of motion. Neck supple.  Cardiovascular: Normal rate and regular rhythm.   Pulmonary/Chest: Effort normal. No respiratory distress. She has no wheezes.  Abdominal: She exhibits no distension.  Musculoskeletal: She exhibits no edema and no tenderness.  Neurological: She is alert and oriented to person, place, and time. She has normal reflexes. She exhibits normal muscle tone. Coordination normal.  Skin: Skin is warm and dry. Rash on face with scaling on eyelids and near lips  Psychiatric: She has a normal mood and affect. Her behavior is normal. Judgment and thought content normal.          Assessment & Plan:  MRSA+ Eczema: I will extend her to 3 months doxy  She can discuss with her Dermatologist the idea of bleach baths 1 cup bleach in tub of water, soak 15 min 3x a week for 3 months with emollients but I think that she needs FULL engangement with Derm here

## 2013-04-06 ENCOUNTER — Ambulatory Visit: Payer: BC Managed Care – PPO | Admitting: Infectious Disease

## 2013-04-14 ENCOUNTER — Other Ambulatory Visit: Payer: Self-pay | Admitting: Internal Medicine

## 2013-04-20 ENCOUNTER — Telehealth: Payer: Self-pay

## 2013-04-20 NOTE — Telephone Encounter (Signed)
May be dissection of blood from bruising to the ankle with gravity, especially if it doesn't hurt.  We can take a quick look tomorrow after school if she wants.

## 2013-04-20 NOTE — Telephone Encounter (Signed)
Phone call from patient [phone]) stating she went hiking Sunday and fell bruising her shins. Now both ankles are swollen and discolored but no pain when she walks. Please advise.

## 2013-04-20 NOTE — Telephone Encounter (Signed)
Pt.notified

## 2013-05-30 ENCOUNTER — Other Ambulatory Visit: Payer: Self-pay | Admitting: Obstetrics and Gynecology

## 2013-06-17 ENCOUNTER — Ambulatory Visit (INDEPENDENT_AMBULATORY_CARE_PROVIDER_SITE_OTHER): Payer: BC Managed Care – PPO | Admitting: Obstetrics and Gynecology

## 2013-06-17 ENCOUNTER — Encounter: Payer: Self-pay | Admitting: Obstetrics and Gynecology

## 2013-06-17 VITALS — BP 110/70 | HR 64 | Ht 63.5 in | Wt 159.5 lb

## 2013-06-17 DIAGNOSIS — R739 Hyperglycemia, unspecified: Secondary | ICD-10-CM

## 2013-06-17 DIAGNOSIS — Z01419 Encounter for gynecological examination (general) (routine) without abnormal findings: Secondary | ICD-10-CM

## 2013-06-17 DIAGNOSIS — R7309 Other abnormal glucose: Secondary | ICD-10-CM

## 2013-06-17 DIAGNOSIS — D229 Melanocytic nevi, unspecified: Secondary | ICD-10-CM

## 2013-06-17 DIAGNOSIS — Z0000 Encounter for general adult medical examination without abnormal findings: Secondary | ICD-10-CM

## 2013-06-17 DIAGNOSIS — D239 Other benign neoplasm of skin, unspecified: Secondary | ICD-10-CM

## 2013-06-17 DIAGNOSIS — Z113 Encounter for screening for infections with a predominantly sexual mode of transmission: Secondary | ICD-10-CM

## 2013-06-17 LAB — COMPREHENSIVE METABOLIC PANEL
ALT: 21 U/L (ref 0–35)
CO2: 27 mEq/L (ref 19–32)
Calcium: 9 mg/dL (ref 8.4–10.5)
Chloride: 103 mEq/L (ref 96–112)
Creat: 0.66 mg/dL (ref 0.50–1.10)
Glucose, Bld: 152 mg/dL — ABNORMAL HIGH (ref 70–99)
Sodium: 137 mEq/L (ref 135–145)
Total Protein: 7 g/dL (ref 6.0–8.3)

## 2013-06-17 LAB — LIPID PANEL
Cholesterol: 191 mg/dL (ref 0–200)
Total CHOL/HDL Ratio: 4.4 Ratio

## 2013-06-17 LAB — HEMOGLOBIN A1C
Hgb A1c MFr Bld: 6.2 % — ABNORMAL HIGH (ref <5.7)
Mean Plasma Glucose: 131 mg/dL — ABNORMAL HIGH (ref <117)

## 2013-06-17 LAB — POCT URINALYSIS DIPSTICK
Bilirubin, UA: NEGATIVE
Blood, UA: NEGATIVE
Nitrite, UA: NEGATIVE
pH, UA: 5

## 2013-06-17 LAB — CBC
MCH: 31.3 pg (ref 26.0–34.0)
MCHC: 34.8 g/dL (ref 30.0–36.0)
Platelets: 280 10*3/uL (ref 150–400)

## 2013-06-17 MED ORDER — NORETHINDRONE-ETH ESTRADIOL 0.4-35 MG-MCG PO TABS: 1.0000 | ORAL_TABLET | Freq: Every day | ORAL | Status: DC
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
## 2013-06-17 NOTE — Patient Instructions (Signed)

## 2013-06-18 LAB — RPR

## 2013-06-18 LAB — HEPATITIS B SURFACE ANTIGEN: Hepatitis B Surface Ag: NEGATIVE

## 2013-06-18 LAB — HEPATITIS C ANTIBODY: HCV Ab: NEGATIVE

## 2013-06-21 LAB — IPS PAP TEST WITH REFLEX TO HPV

## 2013-06-22 ENCOUNTER — Telehealth: Payer: Self-pay | Admitting: Obstetrics and Gynecology

## 2013-06-22 NOTE — Telephone Encounter (Signed)
LMTCB to discuss ins benefits and schedule vulvar biopsy.

## 2013-06-24 ENCOUNTER — Telehealth: Payer: Self-pay

## 2013-06-24 NOTE — Telephone Encounter (Signed)
Message copied by Alphonsa Overall on Fri Jun 24, 2013 10:35 AM ------      Message from: Conley Simmonds      Created: Mon Jun 20, 2013  9:10 AM       Please make a follow up phone call to the patient.  I recommend she see her PCP regarding her elevated cholesterol and blood sugar.            I have released her results to My Chart. ------

## 2013-06-24 NOTE — Telephone Encounter (Signed)
LMOVM to call to discuss test results (labs).

## 2013-06-24 NOTE — Telephone Encounter (Signed)
Patient notified to follow up with PCP regarding elevated blood sugar and cholesterol.  She states she will.

## 2013-06-27 ENCOUNTER — Ambulatory Visit: Payer: BC Managed Care – PPO

## 2013-06-27 ENCOUNTER — Encounter: Payer: Self-pay | Admitting: Obstetrics & Gynecology

## 2013-06-27 ENCOUNTER — Encounter: Payer: Self-pay | Admitting: Obstetrics and Gynecology

## 2013-06-27 ENCOUNTER — Ambulatory Visit (INDEPENDENT_AMBULATORY_CARE_PROVIDER_SITE_OTHER): Payer: BC Managed Care – PPO | Admitting: Obstetrics and Gynecology

## 2013-06-27 VITALS — BP 100/60 | HR 70 | Ht 63.5 in | Wt 154.0 lb

## 2013-06-27 DIAGNOSIS — D229 Melanocytic nevi, unspecified: Secondary | ICD-10-CM

## 2013-06-27 DIAGNOSIS — D239 Other benign neoplasm of skin, unspecified: Secondary | ICD-10-CM

## 2013-06-27 NOTE — Progress Notes (Signed)
Patient ID: Brenda Rosales, female   DOB: [birth year], 27 y.o.   MRN: [id number]  Subjective  Patient here for vulvar biopsy of pigmented area.    Having a lot of skin problems - eczema.  Patient is seeking a second opinion from a new dermatologist in [facility].  Patient has made an appointment to follow up with Dr. Arthur Holms regarding her elevated cholesterol panel and HgbA1C.  Just got back from a trip to the [employer].  Objective  Right vulva - 4 mm, flat, darkly pigmented area of the right labia majora.  Procedure  Consent for vulvar biopsy. Sterile prep with betadine. 1% lidocaine local - 3 cc. 5 mm punch biopsy used. Tissue to pathology. AgNO3 used for hemostasis. Bandaid placed. No complications.  Assessment  Vulvar pigmented nevus. Elevated cholesterol panel and HgbA1C.  Plan  Follow up on biopsy. Precautions given. See PCP for hyperlipidemia and hyperglycemia.

## 2013-06-27 NOTE — Patient Instructions (Signed)
Call if you have heavy bleeding, increasing pain, or redness of the skin.

## 2013-06-29 ENCOUNTER — Ambulatory Visit: Payer: BC Managed Care – PPO | Admitting: Infectious Disease

## 2013-06-29 LAB — IPS OTHER TISSUE BIOPSY

## 2013-07-15 ENCOUNTER — Telehealth: Payer: Self-pay | Admitting: Internal Medicine

## 2013-07-15 NOTE — Telephone Encounter (Signed)
Rec'd from [facility] forward 3 pages to Dr.Norins °

## 2013-07-20 ENCOUNTER — Ambulatory Visit: Payer: BC Managed Care – PPO | Admitting: Infectious Disease

## 2013-08-25 ENCOUNTER — Ambulatory Visit (INDEPENDENT_AMBULATORY_CARE_PROVIDER_SITE_OTHER): Payer: BC Managed Care – PPO | Admitting: Internal Medicine

## 2013-08-25 ENCOUNTER — Encounter: Payer: Self-pay | Admitting: Internal Medicine

## 2013-08-25 VITALS — BP 108/70 | HR 70 | Temp 99.3°F | Wt 157.0 lb

## 2013-08-25 DIAGNOSIS — J328 Other chronic sinusitis: Secondary | ICD-10-CM

## 2013-08-25 MED ORDER — AMOXICILLIN-POT CLAVULANATE ER 1000-62.5 MG PO TB12: 2.0000 | ORAL_TABLET | Freq: Two times a day (BID) | ORAL | Status: DC

## 2013-08-25 NOTE — Patient Instructions (Signed)
Recurrent sinusitis - mostly maxillary. CT '13 with mild right maxillary opacity. Left TM is rosy, not on fire  Plan  augmentin XR twice a day for 10 days  Sudafed 30 mg two or three times a day  Supportive care.

## 2013-08-25 NOTE — Progress Notes (Signed)
Subjective:    Patient ID: Brenda Rosales, female    DOB: 14-Jul-1986, 27 y.o.   MRN: [id number]  HPI Brenda Rosales presents for ear popping. Was seen early September for URI with low grade fever dx'd OM w/ sinusitis - Augmentin x 10 days. Did ok for 2-3 weeks and she had recurring popping self-treated with sudafed. There is a history of recurrent sinus infections in the setting of asthma. She denies rigors or chills, no documented fever. No loss of hearing. No purulent rhinorrhea.  Past Medical History  Diagnosis Date  . ASTHMA 11/19/2007  . CHICKENPOX 11/19/2007  . Dysmenorrhea 11/19/2007  . DYSPHAGIA 11/19/2010  . ECZEMA 11/19/2007  . FATTY LIVER DISEASE 04/17/2008  . GERD 11/20/2010  . Infection of eyelid 08/07/2011  . RHINOSINUSITIS, CHRONIC 12/09/2007  . TB SKIN TEST, POSITIVE 11/19/2007  . Esophageal stricture   . Esophagitis   . Duodenitis   . Anxiety   . Diabetes mellitus without complication     diet controlled   Past Surgical History  Procedure Laterality Date  . Mole excision      x's 4  . Ct sinus ltd w/o cm  09/23/07   Family History  Problem Relation Age of Onset  . Adopted: Yes   History   Social History  . Marital Status: Single    Spouse Name: N/A    Number of Children: N/A  . Years of Education: N/A   Occupational History  . Teacher    Social History Main Topics  . Smoking status: Never Smoker   . Smokeless tobacco: Never Used  . Alcohol Use: Yes     Comment: SOCIALLY  . Drug Use: No  . Sexual Activity: Yes    Birth Control/ Protection: Pill     Comment: Balziva   Other Topics Concern  . Not on file   Social History Narrative   Adopted from [place].    College Occupational psychologist in [employer]   Lives at home wiith her parents   Current Outpatient Prescriptions on File Prior to Visit  Medication Sig Dispense Refill  . ALPRAZolam (NIRAVAM) 0.5 MG dissolvable tablet TAKE 1 TABLET BY MOUTH EVERY 6 HOURS AS NEEDED FOR ANXIETY  30  tablet  0  . chlorhexidine (HIBICLENS) 4 % external liquid Apply 15 mLs (1 application total) topically as directed. WASH HEAD TO TOE FOR 7 DAYS AT NIGHT, DO NOT APPLY MAKEUP OR DEODORANT FOR ONE HOUR AFTER WASH  946 mL  2  . doxycycline (VIBRAMYCIN) 100 MG capsule Take 1 capsule (100 mg total) by mouth 2 (two) times daily.  60 capsule  4  . fexofenadine-pseudoephedrine (ALLEGRA-D 24) 180-240 MG per 24 hr tablet Take 1 tablet by mouth daily.      . fluticasone (CUTIVATE) 0.05 % cream       . fluticasone (FLONASE) 50 MCG/ACT nasal spray Place 2 sprays into the nose daily.  16 g  6  . HALOG 0.1 % CREA       . hydrOXYzine (ATARAX/VISTARIL) 10 MG tablet       . hyoscyamine (LEVSIN SL) 0.125 MG SL tablet Chew finely and swallow q 2 hours prn flare of IBS  60 tablet  5  . mupirocin ointment (BACTROBAN) 2 % Apply topically 2 (two) times daily. USE FOR 7 DAY PERIOD  30 g  3  . norethindrone-ethinyl estradiol (BALZIVA) 0.4-35 MG-MCG tablet Take 1 tablet by mouth daily.  1 Package  11  . omeprazole (  PRILOSEC) 40 MG capsule Take 1 capsule (40 mg total) by mouth daily.  30 capsule  11  . PATADAY 0.2 % SOLN       . PROAIR HFA 108 (90 BASE) MCG/ACT inhaler INHALE TWO PUFFS BY MOUTH FOUR TIMES DAILY AS NEEDED  9 each  4  . Sodium Chloride-Sodium Bicarb (SINUS WASH NETI POT NA) by Nasal route.        . SYMBICORT 160-4.5 MCG/ACT inhaler INHALE TWO PUFFS BY MOUTH TWICE DAILY  10.2 g  5  . [DISCONTINUED] metFORMIN (GLUCOPHAGE) 500 MG tablet Take 500 mg by mouth 2 (two) times daily with a meal.         No current facility-administered medications on file prior to visit.        Review of Systems System review is negative for any constitutional, cardiac, pulmonary, GI or neuro symptoms or complaints other than as described in the HPI.     Objective:   Physical Exam Filed Vitals:   08/25/13 1658  BP: 108/70  Pulse: 70  Temp: 99.3 F (37.4 C)   Gen'l - WNWD woman in no distress HEENT- TMs ok,  tenderness to percussion over facial sinus. Cor- RRR Pulm - good breath sounds, no wheezing or rales.       Assessment & Plan:

## 2013-08-28 NOTE — Assessment & Plan Note (Signed)
Patient with recurrent sinusitis: Recurrent sinusitis - mostly maxillary. CT '13 with mild right maxillary opacity. Left TM is rosy, not on fire  Plan  augmentin XR twice a day for 10 days  Sudafed 30 mg two or three times a day  Supportive care.

## 2013-09-15 ENCOUNTER — Other Ambulatory Visit: Payer: Self-pay

## 2013-12-30 ENCOUNTER — Telehealth: Payer: Self-pay | Admitting: Internal Medicine

## 2013-12-30 NOTE — Telephone Encounter (Signed)
Rec'd from [facility] forward 5 pages to Dr.Norins

## 2014-01-05 ENCOUNTER — Ambulatory Visit: Payer: BC Managed Care – PPO | Admitting: Internal Medicine

## 2014-01-18 ENCOUNTER — Encounter: Payer: Self-pay | Admitting: Internal Medicine

## 2014-01-18 ENCOUNTER — Ambulatory Visit (INDEPENDENT_AMBULATORY_CARE_PROVIDER_SITE_OTHER): Payer: BC Managed Care – PPO | Admitting: Internal Medicine

## 2014-01-18 VITALS — BP 90/68 | HR 66 | Temp 98.5°F | Wt 164.6 lb

## 2014-01-18 DIAGNOSIS — R918 Other nonspecific abnormal finding of lung field: Secondary | ICD-10-CM

## 2014-01-18 NOTE — Progress Notes (Signed)
Pre visit review using our clinic review tool, if applicable. No additional management support is needed unless otherwise documented below in the visit note. 

## 2014-01-18 NOTE — Patient Instructions (Signed)
Good to see you.  I reviewed the [facility] note: the lung exam was recorded as notrmal.  On exam today no sign of active or recurrent infection. Your lungs are clear.  Please continue your medications and don't stop the symbicort unless there is sometig else you are being treated with.  For on-going care will change the PCP to Dr. Jenny Reichmann

## 2014-01-18 NOTE — Progress Notes (Signed)
Subjective:    Patient ID: Brenda Rosales, female    DOB: 10/07/1986, 28 y.o.   MRN: [id number]  HPI Brenda Rosales was seen Feb 24th at [facility] by PA - full note reviewed. She had a normal lung exam on report. She was diagnosed with a hordeleoum left upper lid and sinusitis tx'd Augmentin for 10 days. She was also told that she had an abnormal lung exam with some"sound" in the base of the lung that should be followed up.  In the interval her housemate has been diagnosed with pneumonia.She is only coughing a little and it is not really productive. No fevers. Still with some sinus and post nasal drainage. She takes fexofenadine na flonase nasal spry.   Past Medical History  Diagnosis Date  . ASTHMA 11/19/2007  . CHICKENPOX 11/19/2007  . Dysmenorrhea 11/19/2007  . DYSPHAGIA 11/19/2010  . ECZEMA 11/19/2007  . FATTY LIVER DISEASE 04/17/2008  . GERD 11/20/2010  . Infection of eyelid 08/07/2011  . RHINOSINUSITIS, CHRONIC 12/09/2007  . TB SKIN TEST, POSITIVE 11/19/2007  . Esophageal stricture   . Esophagitis   . Duodenitis   . Anxiety   . Diabetes mellitus without complication     diet controlled   Past Surgical History  Procedure Laterality Date  . Mole excision      x's 4  . Ct sinus ltd w/o cm  09/23/07   Family History  Problem Relation Age of Onset  . Adopted: Yes   History   Social History  . Marital Status: Single    Spouse Name: N/A    Number of Children: N/A  . Years of Education: N/A   Occupational History  . Teacher    Social History Main Topics  . Smoking status: Never Smoker   . Smokeless tobacco: Never Used  . Alcohol Use: Yes     Comment: SOCIALLY  . Drug Use: No  . Sexual Activity: Yes    Birth Control/ Protection: Pill     Comment: Balziva   Other Topics Concern  . Not on file   Social History Narrative   Adopted from [place].    [employer], [employer] in [place] at home wiith her parents    Current Outpatient  Prescriptions on File Prior to Visit  Medication Sig Dispense Refill  . ALPRAZolam (NIRAVAM) 0.5 MG dissolvable tablet TAKE 1 TABLET BY MOUTH EVERY 6 HOURS AS NEEDED FOR ANXIETY  30 tablet  0  . fexofenadine-pseudoephedrine (ALLEGRA-D 24) 180-240 MG per 24 hr tablet Take 1 tablet by mouth daily.      . fluticasone (CUTIVATE) 0.05 % cream       . fluticasone (FLONASE) 50 MCG/ACT nasal spray Place 2 sprays into the nose daily.  16 g  6  . HALOG 0.1 % CREA       . hydrOXYzine (ATARAX/VISTARIL) 10 MG tablet       . hyoscyamine (LEVSIN SL) 0.125 MG SL tablet Chew finely and swallow q 2 hours prn flare of IBS  60 tablet  5  . mupirocin ointment (BACTROBAN) 2 % Apply topically 2 (two) times daily. USE FOR 7 DAY PERIOD  30 g  3  . norethindrone-ethinyl estradiol (BALZIVA) 0.4-35 MG-MCG tablet Take 1 tablet by mouth daily.  1 Package  11  . PATADAY 0.2 % SOLN       . PROAIR HFA 108 (90 BASE) MCG/ACT inhaler INHALE TWO PUFFS BY MOUTH FOUR TIMES DAILY AS  NEEDED  9 each  4  . Sodium Chloride-Sodium Bicarb (SINUS [place] NETI POT NA) by Nasal route.        . SYMBICORT 160-4.5 MCG/ACT inhaler INHALE TWO PUFFS BY MOUTH TWICE DAILY  10.2 g  5  . [DISCONTINUED] metFORMIN (GLUCOPHAGE) 500 MG tablet Take 500 mg by mouth 2 (two) times daily with a meal.         No current facility-administered medications on file prior to visit.      Review of Systems System review is negative for any constitutional, cardiac, pulmonary, GI or neuro symptoms or complaints other than as described in the HPI.      Objective:   Physical Exam Filed Vitals:   01/18/14 1451  BP: 90/68  Pulse: 66  Temp: 98.5 F (36.9 C)   Gen'l- WNWD woman  HEENT- minimal tenderness to percussion over facial sinuses Cor - RRR Pulm - no increased WOB, Lungs - CTAP Neuro - A&O x 3       Assessment & Plan:  Sinus infection - reviewed the [facility] note: the lung exam was recorded as normal.  On exam today no sign of active or  recurrent infection. Your lungs are clear.  Please continue your medications and don't stop the symbicort unless there is sometig else you are being treated with

## 2014-02-08 ENCOUNTER — Emergency Department (HOSPITAL_COMMUNITY): Payer: BC Managed Care – PPO

## 2014-02-08 ENCOUNTER — Emergency Department (HOSPITAL_COMMUNITY)
Admission: EM | Admit: 2014-02-08 | Discharge: 2014-02-08 | Disposition: A | Discharge status: Home / Self Care | Payer: BC Managed Care – PPO | Attending: Emergency Medicine | Admitting: Emergency Medicine

## 2014-02-08 ENCOUNTER — Encounter (HOSPITAL_COMMUNITY): Payer: Self-pay | Admitting: Emergency Medicine

## 2014-02-08 DIAGNOSIS — Z8611 Personal history of tuberculosis: Secondary | ICD-10-CM | POA: Insufficient documentation

## 2014-02-08 DIAGNOSIS — M549 Dorsalgia, unspecified: Secondary | ICD-10-CM | POA: Insufficient documentation

## 2014-02-08 DIAGNOSIS — Z792 Long term (current) use of antibiotics: Secondary | ICD-10-CM | POA: Insufficient documentation

## 2014-02-08 DIAGNOSIS — J45909 Unspecified asthma, uncomplicated: Secondary | ICD-10-CM | POA: Insufficient documentation

## 2014-02-08 DIAGNOSIS — Z872 Personal history of diseases of the skin and subcutaneous tissue: Secondary | ICD-10-CM | POA: Insufficient documentation

## 2014-02-08 DIAGNOSIS — Z8742 Personal history of other diseases of the female genital tract: Secondary | ICD-10-CM | POA: Insufficient documentation

## 2014-02-08 DIAGNOSIS — K5289 Other specified noninfective gastroenteritis and colitis: Secondary | ICD-10-CM

## 2014-02-08 DIAGNOSIS — Z3202 Encounter for pregnancy test, result negative: Secondary | ICD-10-CM | POA: Insufficient documentation

## 2014-02-08 DIAGNOSIS — R197 Diarrhea, unspecified: Secondary | ICD-10-CM

## 2014-02-08 DIAGNOSIS — Z8669 Personal history of other diseases of the nervous system and sense organs: Secondary | ICD-10-CM | POA: Insufficient documentation

## 2014-02-08 DIAGNOSIS — IMO0002 Reserved for concepts with insufficient information to code with codable children: Secondary | ICD-10-CM | POA: Insufficient documentation

## 2014-02-08 DIAGNOSIS — Z79899 Other long term (current) drug therapy: Secondary | ICD-10-CM | POA: Insufficient documentation

## 2014-02-08 DIAGNOSIS — K529 Noninfective gastroenteritis and colitis, unspecified: Secondary | ICD-10-CM

## 2014-02-08 DIAGNOSIS — R109 Unspecified abdominal pain: Secondary | ICD-10-CM

## 2014-02-08 DIAGNOSIS — E663 Overweight: Secondary | ICD-10-CM | POA: Insufficient documentation

## 2014-02-08 DIAGNOSIS — Z8619 Personal history of other infectious and parasitic diseases: Secondary | ICD-10-CM | POA: Insufficient documentation

## 2014-02-08 DIAGNOSIS — F411 Generalized anxiety disorder: Secondary | ICD-10-CM | POA: Insufficient documentation

## 2014-02-08 DIAGNOSIS — R112 Nausea with vomiting, unspecified: Secondary | ICD-10-CM

## 2014-02-08 DIAGNOSIS — E119 Type 2 diabetes mellitus without complications: Secondary | ICD-10-CM | POA: Insufficient documentation

## 2014-02-08 HISTORY — DX: Other specified noninfective gastroenteritis and colitis: K52.89

## 2014-02-08 LAB — URINALYSIS, ROUTINE W REFLEX MICROSCOPIC
BILIRUBIN URINE: NEGATIVE
Glucose, UA: NEGATIVE mg/dL
KETONES UR: NEGATIVE mg/dL
Nitrite: NEGATIVE
Protein, ur: NEGATIVE mg/dL
Specific Gravity, Urine: 1.018 (ref 1.005–1.030)
UROBILINOGEN UA: 0.2 mg/dL (ref 0.0–1.0)
pH: 8 (ref 5.0–8.0)

## 2014-02-08 LAB — CBC WITH DIFFERENTIAL/PLATELET
Basophils Absolute: 0.1 10*3/uL (ref 0.0–0.1)
Basophils Relative: 0 % (ref 0–1)
Eosinophils Absolute: 0.8 10*3/uL — ABNORMAL HIGH (ref 0.0–0.7)
Eosinophils Relative: 5 % (ref 0–5)
HCT: 41.9 % (ref 36.0–46.0)
HEMOGLOBIN: 14.9 g/dL (ref 12.0–15.0)
LYMPHS PCT: 22 % (ref 12–46)
Lymphs Abs: 3.5 10*3/uL (ref 0.7–4.0)
MCH: 31.4 pg (ref 26.0–34.0)
MCHC: 35.6 g/dL (ref 30.0–36.0)
MCV: 88.4 fL (ref 78.0–100.0)
MONOS PCT: 5 % (ref 3–12)
Monocytes Absolute: 0.7 10*3/uL (ref 0.1–1.0)
NEUTROS ABS: 10.7 10*3/uL — AB (ref 1.7–7.7)
Neutrophils Relative %: 68 % (ref 43–77)
PLATELETS: 283 10*3/uL (ref 150–400)
RBC: 4.74 MIL/uL (ref 3.87–5.11)
RDW: 12.1 % (ref 11.5–15.5)
WBC: 15.8 10*3/uL — AB (ref 4.0–10.5)

## 2014-02-08 LAB — COMPREHENSIVE METABOLIC PANEL
ALK PHOS: 65 U/L (ref 39–117)
ALT: 29 U/L (ref 0–35)
AST: 57 U/L — ABNORMAL HIGH (ref 0–37)
Albumin: 3.9 g/dL (ref 3.5–5.2)
BILIRUBIN TOTAL: 0.6 mg/dL (ref 0.3–1.2)
BUN: 12 mg/dL (ref 6–23)
CHLORIDE: 103 meq/L (ref 96–112)
CO2: 25 meq/L (ref 19–32)
Calcium: 9.6 mg/dL (ref 8.4–10.5)
Creatinine, Ser: 0.68 mg/dL (ref 0.50–1.10)
GFR calc non Af Amer: >90 mL/min (ref >90)
GLUCOSE: 157 mg/dL — AB (ref 70–99)
Potassium: 3.4 mEq/L — ABNORMAL LOW (ref 3.7–5.3)
Sodium: 142 mEq/L (ref 137–147)
TOTAL PROTEIN: 8 g/dL (ref 6.0–8.3)

## 2014-02-08 LAB — URINE MICROSCOPIC-ADD ON

## 2014-02-08 LAB — PREGNANCY, URINE: Preg Test, Ur: NEGATIVE

## 2014-02-08 LAB — LIPASE, BLOOD: LIPASE: 33 U/L (ref 11–59)

## 2014-02-08 MED ORDER — CIPROFLOXACIN HCL 500 MG PO TABS: 500.0000 mg | ORAL_TABLET | Freq: Two times a day (BID) | ORAL | Status: DC

## 2014-02-08 MED ORDER — HYDROCODONE-ACETAMINOPHEN 5-325 MG PO TABS: 1.0000 | ORAL_TABLET | ORAL | Status: DC | PRN

## 2014-02-08 MED ORDER — IOHEXOL 300 MG/ML  SOLN
50.0000 mL | Freq: Once | INTRAMUSCULAR | Status: AC | PRN
  Administered 2014-02-08: 50 mL via ORAL

## 2014-02-08 MED ORDER — MORPHINE SULFATE 4 MG/ML IJ SOLN
4.0000 mg | Freq: Once | INTRAMUSCULAR | Status: AC
  Administered 2014-02-08: 4 mg via INTRAVENOUS
  Filled 2014-02-08: qty 1

## 2014-02-08 MED ORDER — IOHEXOL 300 MG/ML  SOLN
100.0000 mL | Freq: Once | INTRAMUSCULAR | Status: AC | PRN
  Administered 2014-02-08: 100 mL via INTRAVENOUS

## 2014-02-08 MED ORDER — ONDANSETRON HCL 4 MG PO TABS: 4.0000 mg | ORAL_TABLET | Freq: Four times a day (QID) | ORAL | Status: DC

## 2014-02-08 MED ORDER — ONDANSETRON HCL 4 MG/2ML IJ SOLN
4.0000 mg | Freq: Once | INTRAMUSCULAR | Status: AC
  Administered 2014-02-08: 4 mg via INTRAVENOUS
  Filled 2014-02-08: qty 2

## 2014-02-08 MED ORDER — METRONIDAZOLE 500 MG PO TABS: 500.0000 mg | ORAL_TABLET | Freq: Two times a day (BID) | ORAL | Status: DC

## 2014-02-08 NOTE — ED Provider Notes (Signed)
Medical screening examination/treatment/procedure(s) were performed by non-physician practitioner and as supervising physician I was immediately available for consultation/collaboration.   EKG Interpretation None       Merryl Hacker, MD 02/08/14 1349

## 2014-02-08 NOTE — ED Notes (Signed)
Pt report upper ab pain with vomiting and diarrhea. Pt states that the pain shoots up and radiates around to back. Pt alert and oriented and family in room. Pt reports a hx of diverticulitis.

## 2014-02-08 NOTE — ED Provider Notes (Signed)
CSN: [id number]     Arrival date & time 02/08/14  2831 History   First MD Initiated Contact with Patient 02/08/14 0600     Chief Complaint  Patient presents with  . Abdominal Pain     (Consider location/radiation/quality/duration/timing/severity/associated sxs/prior Treatment) HPI Comments: Pt is a 28 y/o female with a PMHx of asthma, dysmenorrhea, fatty liver, GERD, diverticulitis, esophageal stricture, DM and anxiety complaining of sudden onset abdominal pain, vomiting and diarrhea that woke her up from sleep around 3:00 this morning. Pain has been constant since, radiating across her upper abdomen and around the sides to her mid back. Any position change makes the pain worse. No alleviating factors. States she had multiple episodes of non-bloody diarrhea and emesis with some BRB. Admits to subjective fever. Last night she ate a hamburger for dinner and was feeling fine. No sick contacts. States she has a hx of diverticulitis, however this pain feels different. LMP was 1 week ago and normal. Denies urinary changes, vaginal bleeding or d/c. She had a colonoscopy in 2009 and endoscopy 2012 with [facility]. No hx of abdominal surgeries.  Patient is a 28 y.o. female presenting with abdominal pain. The history is provided by the patient and a parent.  Abdominal Pain Associated symptoms: diarrhea, fever, nausea and vomiting     Past Medical History  Diagnosis Date  . ASTHMA 11/19/2007  . CHICKENPOX 11/19/2007  . Dysmenorrhea 11/19/2007  . DYSPHAGIA 11/19/2010  . ECZEMA 11/19/2007  . FATTY LIVER DISEASE 04/17/2008  . GERD 11/20/2010  . Infection of eyelid 08/07/2011  . RHINOSINUSITIS, CHRONIC 12/09/2007  . TB SKIN TEST, POSITIVE 11/19/2007  . Esophageal stricture   . Esophagitis   . Duodenitis   . Anxiety   . Diabetes mellitus without complication     diet controlled   Past Surgical History  Procedure Laterality Date  . Mole excision      x's 4  . Ct sinus ltd w/o cm  09/23/07   Family History   Problem Relation Age of Onset  . Adopted: Yes   History  Substance Use Topics  . Smoking status: Never Smoker   . Smokeless tobacco: Never Used  . Alcohol Use: Yes     Comment: SOCIALLY   OB History   Grav Para Term Preterm Abortions TAB SAB Ect Mult Living   0              Review of Systems  Constitutional: Positive for fever.  Gastrointestinal: Positive for nausea, vomiting, abdominal pain and diarrhea.  Musculoskeletal: Positive for back pain.  All other systems reviewed and are negative.      Allergies  Review of patient's allergies indicates no known allergies.  Home Medications   Current Outpatient Rx  Name  Route  Sig  Dispense  Refill  . ALPRAZolam (NIRAVAM) 0.5 MG dissolvable tablet      TAKE 1 TABLET BY MOUTH EVERY 6 HOURS AS NEEDED FOR ANXIETY   30 tablet   0   . fexofenadine-pseudoephedrine (ALLEGRA-D 24) 180-240 MG per 24 hr tablet   Oral   Take 1 tablet by mouth daily.         . fluticasone (CUTIVATE) 0.05 % cream               . fluticasone (FLONASE) 50 MCG/ACT nasal spray   Nasal   Place 2 sprays into the nose daily.   16 g   6   . HALOG 0.1 % CREA               .  hydrOXYzine (ATARAX/VISTARIL) 10 MG tablet               . hyoscyamine (LEVSIN SL) 0.125 MG SL tablet      Chew finely and swallow q 2 hours prn flare of IBS   60 tablet   5   . mupirocin ointment (BACTROBAN) 2 %   Topical   Apply topically 2 (two) times daily. USE FOR 7 DAY PERIOD   30 g   3   . norethindrone-ethinyl estradiol (BALZIVA) 0.4-35 MG-MCG tablet   Oral   Take 1 tablet by mouth daily.   1 Package   11   . PATADAY 0.2 % SOLN               . PROAIR HFA 108 (90 BASE) MCG/ACT inhaler      INHALE TWO PUFFS BY MOUTH FOUR TIMES DAILY AS NEEDED   9 each   4   . Sodium Chloride-Sodium Bicarb (SINUS [place] NETI POT NA)   Nasal   by Nasal route.           . SYMBICORT 160-4.5 MCG/ACT inhaler      INHALE TWO PUFFS BY MOUTH TWICE DAILY    10.2 g   5    BP 121/60  Pulse 71  Temp(Src) 98.1 F (36.7 C) (Oral)  Resp 24  Ht 5\' 3"  (1.6 m)  Wt 160 lb (72.576 kg)  BMI 28.35 kg/m2  SpO2 100%  LMP 02/01/2014 Physical Exam  Nursing note and vitals reviewed. Constitutional: She is oriented to person, place, and time. She appears well-developed and well-nourished. No distress.  Overweight.  HENT:  Head: Normocephalic and atraumatic.  Mouth/Throat: Oropharynx is clear and moist.  Eyes: Conjunctivae are normal. No scleral icterus.  Neck: Normal range of motion. Neck supple.  Cardiovascular: Normal rate, regular rhythm, normal heart sounds and intact distal pulses.   Pulmonary/Chest: Effort normal and breath sounds normal.  Abdominal: Soft. Normal appearance and bowel sounds are normal. She exhibits no distension. There is tenderness in the right upper quadrant and epigastric area. There is guarding. There is no rigidity, no rebound and negative Murphy's sign.  No peritoneal signs.  Musculoskeletal: Normal range of motion. She exhibits no edema.  Neurological: She is alert and oriented to person, place, and time.  Skin: Skin is warm and dry. She is not diaphoretic.  Psychiatric: She has a normal mood and affect. Her behavior is normal.    ED Course  Procedures (including critical care time) Labs Review Labs Reviewed  CBC WITH DIFFERENTIAL - Abnormal; Notable for the following:    WBC 15.8 (*)    Neutro Abs 10.7 (*)    Eosinophils Absolute 0.8 (*)    All other components within normal limits  COMPREHENSIVE METABOLIC PANEL - Abnormal; Notable for the following:    Potassium 3.4 (*)    Glucose, Bld 157 (*)    AST 57 (*)    All other components within normal limits  URINALYSIS, ROUTINE W REFLEX MICROSCOPIC - Abnormal; Notable for the following:    APPearance TURBID (*)    Hgb urine dipstick MODERATE (*)    Leukocytes, UA TRACE (*)    All other components within normal limits  URINE MICROSCOPIC-ADD ON - Abnormal;  Notable for the following:    Bacteria, UA FEW (*)    All other components within normal limits  LIPASE, BLOOD  PREGNANCY, URINE   Imaging Review US Abdomen Complete  02/08/2014   CLINICAL DATA:  Abdominal pain and vomiting  EXAM: ULTRASOUND ABDOMEN COMPLETE  COMPARISON:  CT abdomen and pelvis January 02, 2011  FINDINGS: Gallbladder:  No gallstones or wall thickening visualized. There is no pericholecystic fluid. No sonographic Murphy sign noted.  Common bile duct:  Diameter: 5 mm. There is no intrahepatic, common hepatic, or common bile duct dilatation.  Liver:  No focal lesion identified. Liver echogenicity is mildly increased diffusely.  IVC:  No abnormality visualized.  Pancreas:  A small amount of fluid is seen adjacent to the pancreatic head. No well-defined fluid collection seen. There is no mass or pancreatic duct dilatation. The pancreatic contour is normal.  Spleen:  Size and appearance within normal limits.  Right Kidney:  Length: 10.5 cm. Echogenicity within normal limits. No mass or hydronephrosis visualized.  Left Kidney:  Length: 10.7 cm. Echogenicity within normal limits. No mass or hydronephrosis visualized.  Abdominal aorta:  No aneurysm visualized.  Other findings:  There is no demonstrable ascites except for the minimal fluid adjacent to the pancreatic head.  IMPRESSION: Small amount of fluid adjacent to the pancreatic head, not well-defined. Pancreas otherwise appears normal. This finding could indicate early pancreatitis ; appropriate laboratory correlation advised.  Increased echogenicity in the liver is most likely due to a degree of hepatic steatosis. No focal liver lesions are identified. It should be noted that the sensitivity of ultrasound for focal liver lesions is diminished given underlying hepatic steatosis.  Study otherwise unremarkable.   Electronically Signed   By: Lowella Grip M.D.   On: 02/08/2014 07:28   Ct Abdomen Pelvis W Contrast  02/08/2014   CLINICAL DATA:   Abdominal pain with nausea and vomiting  EXAM: CT ABDOMEN AND PELVIS WITH CONTRAST  TECHNIQUE: Multidetector CT imaging of the abdomen and pelvis was performed using the standard protocol following bolus administration of intravenous contrast. Oral contrast was also administered.  CONTRAST:  138mL OMNIPAQUE IOHEXOL 300 MG/ML  SOLN  COMPARISON:  Abdominal ultrasound February 08, 2014; CT abdomen and pelvis January 02, 2011  FINDINGS: There is subsegmental atelectasis in lung bases. Lung bases are otherwise clear.  The liver is prominent, measuring 19.3 cm in length. There is fatty change in the liver. No focal liver lesions are identified. There is no biliary duct dilatation. The gallbladder wall is not thickened. A small amount of fluid is seen adjacent to the gallbladder which was not appreciable in this area on ultrasound obtained earlier in the day. Note that the fluid which was adjacent to the pancreatic head is not seen at this time currently. Currently, the pancreas appears normal on this study.  Spleen and adrenals appear normal. Kidneys bilaterally show no mass or hydronephrosis. There is no renal or ureteral calculus on either side.  In the pelvis, the urinary bladder is midline with normal wall thickness. There is no pelvic mass or fluid collection. Appendix appears normal.  There is mild wall thickening in the sigmoid colon. There are scattered sigmoid diverticula, but there does not appear to be diverticular inflammation. There are also several loops of distal jejunum and proximal ileum which show wall thickening. There is also mild wall thickening in the terminal ileum. No fistula or mesenteric inflammation seen.  There is no bowel obstruction. No free air or portal venous air. There is no adenopathy or abscess in the abdomen or pelvis. Aorta is nonaneurysmal. There are no blastic or lytic bone lesions.  IMPRESSION: There are areas of enteritis and sigmoid colitis. No frank diverticulitis seen. Note that  there is some mild wall thickening in the terminal ileum. This finding raises question of underlying Crohn's disease as a differential consideration. There is no fistula on this study. There is no bowel obstruction or abscess. There is no appreciable mesenteric inflammatory change.  The liver is enlarged with fatty change.  A small amount of ascites is noted in the right upper quadrant. On ultrasound earlier in the day, a small amount of fluid was seen adjacent pancreatic head. On this study, a small amount of fluid is adjacent to the gallbladder, a change from earlier in the day. It is felt that the fluid seen in these areas is ascitic with movement occurring between the two studies. Pancreas appears normal on this study.   Electronically Signed   By: Lowella Grip M.D.   On: 02/08/2014 08:58     EKG Interpretation None      MDM   Final diagnoses:  Enteritis  Colitis  Abdominal pain  Nausea and vomiting  Diarrhea   Pt presenting with abdominal pain, n/v/d. Non-toxic appearing. Vomited once while I was in the room after palpating abdomen, non-bloody emesis. Tenderness RUQ/mid-epigastric, no peritoneal signs. Labs pending. Will obtain abdominal US to evaluate gallbladder. Pain/nausea meds given. Father upset because pt had to wait for evaluation. She was waiting back in the room to be seen for less than 15 minutes. 7:41 AM Abdominal US showing small amount of fluid adjacent to the pancreatic head, not well-defined, pancreas otherwise appears normal, however could indicate early pancreatitis. Lipase normal. laukocytosis of 15.8. AST slightly elevated at 57. Will obtain CT w contrast for further evaluation. On exam, pt states her pain is somewhat better, however tenderness still prominent. Father in the room with her has been in and out of the room questioning time frames and care for his daughter. He walked into the provider workroom questioning how long until the ultrasound, now questioning  about CT. He is concerned the ultrasound did not show anything and his daughter was still sick. I explained the CT may or may not give a definitive diagnosis, risks vs benefits explained. 9:15 AM CT scan showing coliting, enteritis, concern for Croh's. Pancreas appears normal. Small amount of fluid near gallbladder rather than pancreas, appears to be ascited. Pt reports she is nor feeling much better, just a little nauseated. Tolerating PO, drank all oral contrast without vomiting. Repeat abdominal exam is much improved. Stable for d/c. Tx with cipro, flagyl, pain/nausea control. F/u with her GI regarding possible Croh's. Return precautions given. Patient states understanding of treatment care plan and is agreeable.   Illene Labrador, PA-C 02/08/14 [phone]

## 2014-02-08 NOTE — Discharge Instructions (Signed)
Take both antibiotics (cipro and flagyl) to completion for your colitis and enteritis. Take zofran as directed as needed for nausea. Take Vicodin for severe pain only. No driving or operating heavy machinery while taking vicodin. This medication may cause drowsiness.  Colitis Colitis is inflammation of the colon. Colitis can be a short-term or long-standing (chronic) illness. Crohn's disease and ulcerative colitis are 2 types of colitis which are chronic. They usually require lifelong treatment. CAUSES  There are many different causes of colitis, including:  Viruses.  Germs (bacteria).  Medicine reactions. SYMPTOMS   Diarrhea.  Intestinal bleeding.  Pain.  Fever.  Throwing up (vomiting).  Tiredness (fatigue).  Weight loss.  Bowel blockage. DIAGNOSIS  The diagnosis of colitis is based on examination and stool or blood tests. X-rays, CT scan, and colonoscopy may also be needed. TREATMENT  Treatment may include:  Fluids given through the vein (intravenously).  Bowel rest (nothing to eat or drink for a period of time).  Medicine for pain and diarrhea.  Medicines (antibiotics) that kill germs.  Cortisone medicines.  Surgery. HOME CARE INSTRUCTIONS   Get plenty of rest.  Drink enough water and fluids to keep your urine clear or pale yellow.  Eat a well-balanced diet.  Call your caregiver for follow-up as recommended. SEEK IMMEDIATE MEDICAL CARE IF:   You develop chills.  You have an oral temperature above 102 F (38.9 C), not controlled by medicine.  You have extreme weakness, fainting, or dehydration.  You have repeated vomiting.  You develop severe belly (abdominal) pain or are passing bloody or tarry stools. MAKE SURE YOU:   Understand these instructions.  Will watch your condition.  Will get help right away if you are not doing well or get worse. Document Released: 12/04/2004 Document Revised: 01/19/2012 Document Reviewed: 03/01/2010 [facility]  Patient Information 2014 [place], [place].  Diet for Diarrhea, Adult Frequent, runny stools (diarrhea) may be caused or worsened by food or drink. Diarrhea may be relieved by changing your diet. Since diarrhea can last up to 7 days, it is easy for you to lose too much fluid from the body and become dehydrated. Fluids that are lost need to be replaced. Along with a modified diet, make sure you drink enough fluids to keep your urine clear or pale yellow. DIET INSTRUCTIONS  Ensure adequate fluid intake (hydration): have 1 cup (8 oz) of fluid for each diarrhea episode. Avoid fluids that contain simple sugars or sports drinks, fruit juices, whole milk products, and sodas. Your urine should be clear or pale yellow if you are drinking enough fluids. Hydrate with an oral rehydration solution that you can purchase at pharmacies, retail stores, and online. You can prepare an oral rehydration solution at home by mixing the following ingredients together:    tsp table salt.   tsp baking soda.   tsp salt substitute containing potassium chloride.  1  tablespoons sugar.  1 L (34 oz) of water.  Certain foods and beverages may increase the speed at which food moves through the gastrointestinal (GI) tract. These foods and beverages should be avoided and include:  Caffeinated and alcoholic beverages.  High-fiber foods, such as raw fruits and vegetables, nuts, seeds, and whole grain breads and cereals.  Foods and beverages sweetened with sugar alcohols, such as xylitol, sorbitol, and mannitol.  Some foods may be well tolerated and may help thicken stool including:  Starchy foods, such as rice, toast, pasta, low-sugar cereal, oatmeal, grits, baked potatoes, crackers, and bagels.   Bananas.  Applesauce.  Add probiotic-rich foods to help increase healthy bacteria in the GI tract, such as yogurt and fermented milk products. RECOMMENDED FOODS AND BEVERAGES Starches Choose foods with less than 2 g of  fiber per serving.  Recommended:  White, [place], and pita breads, plain rolls, buns, bagels. Plain muffins, matzo. Soda, saltine, or graham crackers. Pretzels, melba toast, zwieback. Cooked cereals made with water: cornmeal, farina, cream cereals. Dry cereals: refined corn, wheat, rice. Potatoes prepared any way without skins, refined macaroni, spaghetti, noodles, refined rice.  Avoid:  Bread, rolls, or crackers made with whole wheat, multi-grains, rye, bran seeds, nuts, or coconut. Corn tortillas or taco shells. Cereals containing whole grains, multi-grains, bran, coconut, nuts, raisins. Cooked or dry oatmeal. Coarse wheat cereals, granola. Cereals advertised as "high-fiber." Potato skins. Whole grain pasta, wild or brown rice. Popcorn. Sweet potatoes, yams. Sweet rolls, doughnuts, waffles, pancakes, sweet breads. Vegetables  Recommended: Strained tomato and vegetable juices. Most well-cooked and canned vegetables without seeds. Fresh: Tender lettuce, cucumber without the skin, cabbage, spinach, bean sprouts.  Avoid: Fresh, cooked, or canned: Artichokes, baked beans, beet greens, broccoli, Brussels sprouts, corn, kale, legumes, peas, sweet potatoes. Cooked: Green or red cabbage, spinach. Avoid large servings of any vegetables because vegetables shrink when cooked, and they contain more fiber per serving than fresh vegetables. Fruit  Recommended: Cooked or canned: Apricots, applesauce, cantaloupe, cherries, fruit cocktail, grapefruit, grapes, kiwi, mandarin oranges, peaches, pears, plums, watermelon. Fresh: Apples without skin, ripe banana, grapes, cantaloupe, cherries, grapefruit, peaches, oranges, plums. Keep servings limited to  cup or 1 piece.  Avoid: Fresh: Apples with skin, apricots, mangoes, pears, raspberries, strawberries. Prune juice, stewed or dried prunes. Dried fruits, raisins, dates. Large servings of all fresh fruits. Protein  Recommended: Ground or well-cooked tender beef, ham,  veal, lamb, pork, or poultry. Eggs. Fish, oysters, shrimp, lobster, other seafoods. Liver, organ meats.  Avoid: Tough, fibrous meats with gristle. Peanut butter, smooth or chunky. Cheese, nuts, seeds, legumes, dried peas, beans, lentils. Dairy  Recommended: Yogurt, lactose-free milk, kefir, drinkable yogurt, buttermilk, soy milk, or plain hard cheese.  Avoid: Milk, chocolate milk, beverages made with milk, such as milkshakes. Soups  Recommended: Bouillon, broth, or soups made from allowed foods. Any strained soup.  Avoid: Soups made from vegetables that are not allowed, cream or milk-based soups. Desserts and Sweets  Recommended: Sugar-free gelatin, sugar-free frozen ice pops made without sugar alcohol.  Avoid: Plain cakes and cookies, pie made with fruit, pudding, custard, cream pie. Gelatin, fruit, ice, sherbet, frozen ice pops. Ice cream, ice milk without nuts. Plain hard candy, honey, jelly, molasses, syrup, sugar, chocolate syrup, gumdrops, marshmallows. Fats and Oils  Recommended: Limit fats to less than 8 tsp per day.  Avoid: Seeds, nuts, olives, avocados. Margarine, butter, cream, mayonnaise, salad oils, plain salad dressings. Plain gravy, crisp bacon without rind. Beverages  Recommended: Water, decaffeinated teas, oral rehydration solutions, sugar-free beverages not sweetened with sugar alcohols.  Avoid: Fruit juices, caffeinated beverages (coffee, tea, soda), alcohol, sports drinks, or lemon-lime soda. Condiments  Recommended: Ketchup, mustard, horseradish, vinegar, cocoa powder. Spices in moderation: allspice, basil, bay leaves, celery powder or leaves, cinnamon, cumin powder, curry powder, ginger, mace, marjoram, onion or garlic powder, oregano, paprika, parsley flakes, ground pepper, rosemary, sage, savory, tarragon, thyme, turmeric.  Avoid: Coconut, honey. Document Released: 01/17/2004 Document Revised: 07/21/2012 Document Reviewed: 03/12/2012 [facility] Patient  Information 2014 [place].

## 2014-02-15 ENCOUNTER — Ambulatory Visit: Payer: BC Managed Care – PPO | Admitting: Internal Medicine

## 2014-02-21 ENCOUNTER — Encounter: Payer: Self-pay | Admitting: Internal Medicine

## 2014-02-21 ENCOUNTER — Ambulatory Visit (INDEPENDENT_AMBULATORY_CARE_PROVIDER_SITE_OTHER): Payer: BC Managed Care – PPO | Admitting: Internal Medicine

## 2014-02-21 VITALS — BP 100/70 | HR 76 | Temp 99.2°F | Ht 63.0 in | Wt 157.2 lb

## 2014-02-21 DIAGNOSIS — K921 Melena: Secondary | ICD-10-CM

## 2014-02-21 DIAGNOSIS — E1165 Type 2 diabetes mellitus with hyperglycemia: Secondary | ICD-10-CM

## 2014-02-21 DIAGNOSIS — IMO0001 Reserved for inherently not codable concepts without codable children: Secondary | ICD-10-CM

## 2014-02-21 DIAGNOSIS — J019 Acute sinusitis, unspecified: Secondary | ICD-10-CM | POA: Insufficient documentation

## 2014-02-21 DIAGNOSIS — K5289 Other specified noninfective gastroenteritis and colitis: Secondary | ICD-10-CM | POA: Insufficient documentation

## 2014-02-21 MED ORDER — BUDESONIDE-FORMOTEROL FUMARATE 160-4.5 MCG/ACT IN AERO: 2.0000 | INHALATION_SPRAY | Freq: Two times a day (BID) | RESPIRATORY_TRACT | Status: DC

## 2014-02-21 MED ORDER — ALBUTEROL SULFATE HFA 108 (90 BASE) MCG/ACT IN AERS: 2.0000 | INHALATION_SPRAY | Freq: Four times a day (QID) | RESPIRATORY_TRACT | Status: DC | PRN

## 2014-02-21 MED ORDER — FLUTICASONE PROPIONATE 50 MCG/ACT NA SUSP: 2.0000 | Freq: Every day | NASAL | Status: DC

## 2014-02-21 NOTE — Patient Instructions (Signed)
Please continue all other medications as before, and refills have been done if requested. Please have the pharmacy call with any other refills you may need.  Please continue your efforts at being more active, low cholesterol diet, and weight control.  Please go to the LAB in the Basement (turn left off the elevator) for the tests to be done today  You will be contacted by phone if any changes need to be made immediately.  Otherwise, you will receive a letter about your results with an explanation, but please check with MyChart first.  You will be contacted regarding the referral for: gastroenterology  Please remember to sign up for MyChart if you have not done so, as this will be important to you in the future with finding out test results, communicating by private email, and scheduling acute appointments online when needed.  Please return in 6 months, or sooner if needed

## 2014-02-21 NOTE — Progress Notes (Signed)
Subjective:    Patient ID: Brenda Rosales, female    DOB: [birth year], 28 y.o.   MRN: [id number]  HPI here to f/u recent enteritis/colitis, symptoms much improved with IV antibx, hospd as per chart, now finishing antibx with no worsening n/v, abd pain, fever, loose stools, BRB,  Did have an episode of BRB at onset, and prior to going to hosp for tx.,  It was suggested to pt to consider f/u with GI to r/o chrohns; recent CT documented enteritis/colitis. Still feels general weak, is back to work, no significant wt loss, but with several daily looser stools. Pt denies chest pain, increased sob or doe, wheezing, orthopnea, PND, increased LE swelling, palpitations, dizziness or syncope. Past Medical History  Diagnosis Date  . ASTHMA 11/19/2007  . CHICKENPOX 11/19/2007  . Dysmenorrhea 11/19/2007  . DYSPHAGIA 11/19/2010  . ECZEMA 11/19/2007  . FATTY LIVER DISEASE 04/17/2008  . GERD 11/20/2010  . Infection of eyelid 08/07/2011  . RHINOSINUSITIS, CHRONIC 12/09/2007  . TB SKIN TEST, POSITIVE 11/19/2007  . Esophageal stricture   . Esophagitis   . Duodenitis   . Anxiety   . Diabetes mellitus without complication     diet controlled   Past Surgical History  Procedure Laterality Date  . Mole excision      x's 4  . Ct sinus ltd w/o cm  09/23/07    reports that she has never smoked. She has never used smokeless tobacco. She reports that she drinks alcohol. She reports that she does not use illicit drugs. family history is not on file. She was adopted. No Known Allergies Current Outpatient Prescriptions on File Prior to Visit  Medication Sig Dispense Refill  . fexofenadine (ALLEGRA) 180 MG tablet Take 180 mg by mouth daily as needed for allergies or rhinitis.      . fluticasone (CUTIVATE) 0.05 % cream Apply 1 application topically daily.       . norethindrone-ethinyl estradiol (BALZIVA) 0.4-35 MG-MCG tablet Take 1 tablet by mouth daily.  1 Package  11  . Pseudoephedrine HCl (SUDAFED 24 HOUR NON-DROWSY) 240  MG TB24 Take 240 mg by mouth daily as needed (congestion).      . ciprofloxacin (CIPRO) 500 MG tablet Take 1 tablet (500 mg total) by mouth 2 (two) times daily. One po bid x 7 days  14 tablet  0  . HYDROcodone-acetaminophen (NORCO/VICODIN) 5-325 MG per tablet Take 1-2 tablets by mouth every 4 (four) hours as needed.  10 tablet  0  . metroNIDAZOLE (FLAGYL) 500 MG tablet Take 1 tablet (500 mg total) by mouth 2 (two) times daily. One po bid x 7 days  14 tablet  0  . ondansetron (ZOFRAN) 4 MG tablet Take 1 tablet (4 mg total) by mouth every 6 (six) hours.  12 tablet  0  . [DISCONTINUED] metFORMIN (GLUCOPHAGE) 500 MG tablet Take 500 mg by mouth 2 (two) times daily with a meal.         No current facility-administered medications on file prior to visit.   Review of Systems  Constitutional: Negative for unexpected weight change, or unusual diaphoresis  HENT: Negative for tinnitus.   Eyes: Negative for photophobia and visual disturbance.  Respiratory: Negative for choking and stridor.   Genitourinary: Negative for hematuria and decreased urine volume.  Musculoskeletal: Negative for acute joint swelling Skin: Negative for color change and wound.  Neurological: Negative for tremors and numbness other than noted  Psychiatric/Behavioral: Negative for decreased concentration or  hyperactivity.  Objective:   Physical Exam BP 100/70  Pulse 76  Temp(Src) 99.2 F (37.3 C) (Oral)  Ht 5\' 3"  (1.6 m)  Wt 157 lb 4 oz (71.328 kg)  BMI 27.86 kg/m2  SpO2 97%  LMP 02/01/2014 VS noted,  Constitutional: Pt appears well-developed and well-nourished. Brenda Rosales HENT: Head: NCAT.  Right Ear: External ear normal.  Left Ear: External ear normal.  Eyes: Conjunctivae and EOM are normal. Pupils are equal, round, and reactive to light.  Neck: Normal range of motion. Neck supple.  Cardiovascular: Normal rate and regular rhythm.   Pulmonary/Chest: Effort normal and breath sounds normal.  Abd:  Soft, NT,  non-distended, + BS Neurological: Pt is alert. Not confused  Skin: Skin is warm. No erythema.  Psychiatric: Pt behavior is normal. Thought content normal.     Assessment & Plan:

## 2014-02-21 NOTE — Progress Notes (Signed)
Pre visit review using our clinic review tool, if applicable. No additional management support is needed unless otherwise documented below in the visit note. 

## 2014-02-22 ENCOUNTER — Other Ambulatory Visit (INDEPENDENT_AMBULATORY_CARE_PROVIDER_SITE_OTHER): Payer: BC Managed Care – PPO

## 2014-02-22 DIAGNOSIS — IMO0001 Reserved for inherently not codable concepts without codable children: Secondary | ICD-10-CM

## 2014-02-22 DIAGNOSIS — K921 Melena: Secondary | ICD-10-CM

## 2014-02-22 DIAGNOSIS — E1165 Type 2 diabetes mellitus with hyperglycemia: Principal | ICD-10-CM

## 2014-02-22 LAB — LIPID PANEL
CHOL/HDL RATIO: 5
Cholesterol: 180 mg/dL (ref 0–200)
HDL: 39.7 mg/dL (ref >39.00)
LDL Cholesterol: 107 mg/dL — ABNORMAL HIGH (ref 0–99)
Triglycerides: 167 mg/dL — ABNORMAL HIGH (ref 0.0–149.0)
VLDL: 33.4 mg/dL (ref 0.0–40.0)

## 2014-02-22 LAB — BASIC METABOLIC PANEL
BUN: 10 mg/dL (ref 6–23)
CHLORIDE: 104 meq/L (ref 96–112)
CO2: 27 meq/L (ref 19–32)
Calcium: 9.4 mg/dL (ref 8.4–10.5)
Creatinine, Ser: 0.7 mg/dL (ref 0.4–1.2)
GFR: 115.21 mL/min (ref >60.00)
Glucose, Bld: 96 mg/dL (ref 70–99)
Potassium: 3.7 mEq/L (ref 3.5–5.1)
SODIUM: 137 meq/L (ref 135–145)

## 2014-02-22 LAB — HEPATIC FUNCTION PANEL
ALK PHOS: 47 U/L (ref 39–117)
ALT: 22 U/L (ref 0–35)
AST: 21 U/L (ref 0–37)
Albumin: 4 g/dL (ref 3.5–5.2)
BILIRUBIN DIRECT: 0.1 mg/dL (ref 0.0–0.3)
Total Bilirubin: 1 mg/dL (ref 0.3–1.2)
Total Protein: 7.6 g/dL (ref 6.0–8.3)

## 2014-02-22 LAB — CBC WITH DIFFERENTIAL/PLATELET
Basophils Absolute: 0.1 K/uL (ref 0.0–0.1)
Basophils Relative: 0.9 % (ref 0.0–3.0)
Eosinophils Absolute: 0.8 K/uL — ABNORMAL HIGH (ref 0.0–0.7)
Eosinophils Relative: 9 % — ABNORMAL HIGH (ref 0.0–5.0)
HCT: 41.5 % (ref 36.0–46.0)
Hemoglobin: 14.5 g/dL (ref 12.0–15.0)
Lymphocytes Relative: 37.6 % (ref 12.0–46.0)
Lymphs Abs: 3.2 K/uL (ref 0.7–4.0)
MCHC: 35 g/dL (ref 30.0–36.0)
MCV: 89.7 fl (ref 78.0–100.0)
Monocytes Absolute: 0.7 K/uL (ref 0.1–1.0)
Monocytes Relative: 7.9 % (ref 3.0–12.0)
Neutro Abs: 3.8 K/uL (ref 1.4–7.7)
Neutrophils Relative %: 44.6 % (ref 43.0–77.0)
Platelets: 275 K/uL (ref 150.0–400.0)
RBC: 4.63 Mil/uL (ref 3.87–5.11)
RDW: 12.3 % (ref 11.5–14.6)
WBC: 8.5 K/uL (ref 4.5–10.5)

## 2014-02-22 LAB — HEMOGLOBIN A1C: HEMOGLOBIN A1C: 6.3 % (ref 4.6–6.5)

## 2014-02-22 NOTE — Assessment & Plan Note (Signed)
initally prior to hospn, ok for GI referral, ? Need endoscopy/colon

## 2014-02-22 NOTE — Assessment & Plan Note (Signed)
stable overall by history and exam, recent data reviewed with pt, and pt to continue medical treatment as before,  to f/u any worsening symptoms or concerns Lab Results  Component Value Date   HGBA1C 6.2* 06/17/2013   For f/u lab

## 2014-02-22 NOTE — Assessment & Plan Note (Signed)
Clinically improved,  to f/u any worsening symptoms or concerns  

## 2014-02-28 ENCOUNTER — Encounter: Payer: Self-pay | Admitting: Internal Medicine

## 2014-03-07 ENCOUNTER — Telehealth: Payer: Self-pay

## 2014-03-07 NOTE — Telephone Encounter (Signed)
Relevant patient education assigned to patient using Emmi. ° °

## 2014-04-21 ENCOUNTER — Ambulatory Visit (INDEPENDENT_AMBULATORY_CARE_PROVIDER_SITE_OTHER): Payer: BC Managed Care – PPO | Admitting: Internal Medicine

## 2014-04-21 ENCOUNTER — Encounter: Payer: Self-pay | Admitting: Internal Medicine

## 2014-04-21 VITALS — BP 90/60 | HR 66 | Ht 63.0 in | Wt 156.4 lb

## 2014-04-21 DIAGNOSIS — K589 Irritable bowel syndrome without diarrhea: Secondary | ICD-10-CM

## 2014-04-21 DIAGNOSIS — K5289 Other specified noninfective gastroenteritis and colitis: Secondary | ICD-10-CM

## 2014-04-21 NOTE — Progress Notes (Signed)
HISTORY OF PRESENT ILLNESS:  Brenda Rosales is a 28 y.o. female with past medical history as listed below who has been seen in this office for GERD complicated by erosive esophagitis with symptomatic peptic stricture requiring dilation, fatty liver disease, and recurrent problems with abdominal pain. She presents today regarding issues with abdominal pain nausea vomiting and diarrhea. The patient was well until 02/08/2014 when she was awoken at 3:30 AM with nausea, vomiting, diarrhea, and upper abdominal pain. She went to the emergency room. Laboratories were remarkable for leukocytosis. LFT abnormality stable. Normal lipase. Ultrasound obtained which suggested fluid in the upper abdomen. CT scan revealed inflammation of the sigmoid colon and ileum. She was treated symptomatically in the emergency room and discharged with antibiotics. Thereafter she reported irregularity of her bowels. Softer stool consistency. Now with some postprandial urgency at times and nonspecific decrease in appetite. 3-4 pound weight loss. Overall, significantly improved from April. She did undergo colonoscopy in June 2009. Except for isolated right-sided diverticula, but colon and terminal ileum were normal. Her last upper endoscopy with dilation was performed January 2012. She is not on PPI.  REVIEW OF SYSTEMS:  All non-GI ROS negative upon review  Past Medical History  Diagnosis Date  . ASTHMA 11/19/2007  . CHICKENPOX 11/19/2007  . Dysmenorrhea 11/19/2007  . DYSPHAGIA 11/19/2010  . ECZEMA 11/19/2007  . FATTY LIVER DISEASE 04/17/2008  . GERD 11/20/2010  . Infection of eyelid 08/07/2011  . RHINOSINUSITIS, CHRONIC 12/09/2007  . TB SKIN TEST, POSITIVE 11/19/2007  . Esophageal stricture   . Esophagitis   . Duodenitis   . Anxiety   . Diabetes mellitus without complication     diet controlled  . Other and unspecified noninfectious gastroenteritis and colitis(558.9) 02/2014    Past Surgical History  Procedure Laterality Date  .  Mole excision      x's 4  . Ct sinus ltd w/o cm  09/23/07    Social History Brenda Rosales  reports that she has never smoked. She has never used smokeless tobacco. She reports that she drinks alcohol. She reports that she does not use illicit drugs.  family history is not on file. She was adopted.  No Known Allergies     PHYSICAL EXAMINATION: Vital signs: BP 90/60  Pulse 66  Ht 5\' 3"  (1.6 m)  Wt 156 lb 6.4 oz (70.943 kg)  BMI 27.71 kg/m2  LMP 03/24/2014 General: Well-developed, well-nourished, no acute distress HEENT: Sclerae are anicteric, conjunctiva pink. Oral mucosa intact Lungs: Clear Heart: Regular Abdomen: soft, obese, nontender, nondistended, no obvious ascites, no peritoneal signs, normal bowel sounds. No organomegaly. Extremities: No edema Psychiatric: alert and oriented x3. Cooperative     ASSESSMENT:  #1. Acute illness in April consistent with infectious gastroenteritis. #2. Current symptoms consistent with postinfectious IBS. Gradually improving. Certainly tolerable #3. GERD complicated by erosive esophagitis and peptic stricture. Not on PPI   PLAN:  #1. Recommend fiber supplementation and time #2. Resume PPI #3. GI followup as needed

## 2014-04-21 NOTE — Patient Instructions (Signed)
Please follow up with Dr. Perry as needed 

## 2014-06-22 ENCOUNTER — Ambulatory Visit (INDEPENDENT_AMBULATORY_CARE_PROVIDER_SITE_OTHER): Payer: BC Managed Care – PPO | Admitting: Emergency Medicine

## 2014-06-22 VITALS — BP 122/78 | HR 80 | Temp 98.3°F | Resp 18 | Ht 63.5 in | Wt 155.4 lb

## 2014-06-22 DIAGNOSIS — L309 Dermatitis, unspecified: Secondary | ICD-10-CM

## 2014-06-22 DIAGNOSIS — F411 Generalized anxiety disorder: Secondary | ICD-10-CM

## 2014-06-22 DIAGNOSIS — L259 Unspecified contact dermatitis, unspecified cause: Secondary | ICD-10-CM

## 2014-06-22 MED ORDER — LORAZEPAM 1 MG PO TABS: 1.0000 mg | ORAL_TABLET | Freq: Three times a day (TID) | ORAL | Status: DC | PRN

## 2014-06-22 MED ORDER — TACROLIMUS 0.1 % EX OINT: TOPICAL_OINTMENT | Freq: Two times a day (BID) | CUTANEOUS | Status: DC

## 2014-06-22 NOTE — Progress Notes (Signed)
[facility] [address] [place] [address] [phone]  Date:  06/22/2014   Name:  Brenda Rosales   DOB:  [birth year]   MRN:  [id number]  PCP:  Cathlean Cower, MD    Chief Complaint: Eczema   History of Present Illness:  Brenda Rosales is a 28 y.o. very pleasant female patient who presents with the following:  Patient has a history of severe unrelenting eczema on her face that seems to be worsening.  She is using a topical steroid cream and has extensive facial scaling and erythema. Is anxious and reluctant to leave the house as a consequence.  Not sleeping and has begun to have panic attacks.  No improvement with over the counter medications or other home remedies.  Denies other complaint or health concern today.   Patient Active Problem List   Diagnosis Date Noted  . Hematochezia 02/21/2014  . Acute sinusitis 02/21/2014  . Other and unspecified noninfectious gastroenteritis and colitis(558.9) 02/21/2014  . Boil, face 06/22/2012  . IBS (irritable bowel syndrome) 01/08/2012  . Situational anxiety 01/08/2012  . Type II or unspecified type diabetes mellitus without mention of complication, uncontrolled 09/22/2011  . GERD 11/20/2010  . DYSPHAGIA 11/19/2010  . FATTY LIVER DISEASE 04/17/2008  . RHINOSINUSITIS, CHRONIC 12/09/2007  . ASTHMA 11/19/2007  . DYSMENORRHEA 11/19/2007  . ECZEMA 11/19/2007  . TB SKIN TEST, POSITIVE 11/19/2007    Past Medical History  Diagnosis Date  . ASTHMA 11/19/2007  . CHICKENPOX 11/19/2007  . Dysmenorrhea 11/19/2007  . DYSPHAGIA 11/19/2010  . ECZEMA 11/19/2007  . FATTY LIVER DISEASE 04/17/2008  . GERD 11/20/2010  . Infection of eyelid 08/07/2011  . RHINOSINUSITIS, CHRONIC 12/09/2007  . TB SKIN TEST, POSITIVE 11/19/2007  . Esophageal stricture   . Esophagitis   . Duodenitis   . Anxiety   . Diabetes mellitus without complication     diet controlled  . Other and unspecified noninfectious gastroenteritis and colitis(558.9) 02/2014     Past Surgical History  Procedure Laterality Date  . Mole excision      x's 4  . Ct sinus ltd w/o cm  09/23/07    History  Substance Use Topics  . Smoking status: Never Smoker   . Smokeless tobacco: Never Used  . Alcohol Use: Yes     Comment: SOCIALLY    Family History  Problem Relation Age of Onset  . Adopted: Yes    No Known Allergies  Medication list has been reviewed and updated.  Current Outpatient Prescriptions on File Prior to Visit  Medication Sig Dispense Refill  . albuterol (PROVENTIL HFA;VENTOLIN HFA) 108 (90 BASE) MCG/ACT inhaler Inhale 2 puffs into the lungs every 6 (six) hours as needed for wheezing or shortness of breath.  1 Inhaler  11  . budesonide-formoterol (SYMBICORT) 160-4.5 MCG/ACT inhaler Inhale 2 puffs into the lungs 2 (two) times daily.  1 Inhaler  11  . fexofenadine (ALLEGRA) 180 MG tablet Take 180 mg by mouth daily as needed for allergies or rhinitis.      . fluticasone (CUTIVATE) 0.05 % cream Apply 1 application topically daily.       . fluticasone (FLONASE) 50 MCG/ACT nasal spray Place 2 sprays into both nostrils daily.  16 g  11  . norethindrone-ethinyl estradiol (BALZIVA) 0.4-35 MG-MCG tablet Take 1 tablet by mouth daily.  1 Package  11  . Pseudoephedrine HCl (SUDAFED 24 HOUR NON-DROWSY) 240 MG TB24 Take 240 mg by mouth daily as needed (congestion).      . [  DISCONTINUED] metFORMIN (GLUCOPHAGE) 500 MG tablet Take 500 mg by mouth 2 (two) times daily with a meal.         No current facility-administered medications on file prior to visit.    Review of Systems:  As per HPI, otherwise negative.    Physical Examination: Filed Vitals:   06/22/14 1436  BP: 122/78  Pulse: 80  Temp: 98.3 F (36.8 C)  Resp: 18   Filed Vitals:   06/22/14 1436  Height: 5' 3.5" (1.613 m)  Weight: 155 lb 6.4 oz (70.489 kg)   Body mass index is 27.09 kg/(m^2). Ideal Body Weight: Weight in (lb) to have BMI = 25: 143.1  GEN: WDWN, NAD, Non-toxic, A & O x  3 HEENT: Atraumatic, Normocephalic. Neck supple. No masses, No LAD. Ears and Nose: No external deformity. CV: RRR, No M/G/R. No JVD. No thrill. No extra heart sounds. PULM: CTA B, no wheezes, crackles, rhonchi. No retractions. No resp. distress. No accessory muscle use. ABD: S, NT, ND, +BS. No rebound. No HSM. EXTR: No c/c/e NEURO Normal gait.  PSYCH: Normally interactive. Conversant. Tearful and anxious appearing.  Calm demeanor.  SKIN:  Severe eczema face  Assessment and Plan: Stop using steroid creams Try protopic Ativan  Follow up with dermatology  Signed,  Ellison Carwin, MD

## 2014-06-22 NOTE — Patient Instructions (Signed)
Eczema Eczema, also called atopic dermatitis, is a skin disorder that causes inflammation of the skin. It causes a red rash and dry, scaly skin. The skin becomes very itchy. Eczema is generally worse during the cooler winter months and often improves with the warmth of summer. Eczema usually starts showing signs in infancy. Some children outgrow eczema, but it may last through adulthood.  CAUSES  The exact cause of eczema is not known, but it appears to run in families. People with eczema often have a family history of eczema, allergies, asthma, or hay fever. Eczema is not contagious. Flare-ups of the condition may be caused by:   Contact with something you are sensitive or allergic to.   Stress. SIGNS AND SYMPTOMS  Dry, scaly skin.   Red, itchy rash.   Itchiness. This may occur before the skin rash and may be very intense.  DIAGNOSIS  The diagnosis of eczema is usually made based on symptoms and medical history. TREATMENT  Eczema cannot be cured, but symptoms usually can be controlled with treatment and other strategies. A treatment plan might include:  Controlling the itching and scratching.   Use over-the-counter antihistamines as directed for itching. This is especially useful at night when the itching tends to be worse.   Use over-the-counter steroid creams as directed for itching.   Avoid scratching. Scratching makes the rash and itching worse. It may also result in a skin infection (impetigo) due to a break in the skin caused by scratching.   Keeping the skin well moisturized with creams every day. This will seal in moisture and help prevent dryness. Lotions that contain alcohol and water should be avoided because they can dry the skin.   Limiting exposure to things that you are sensitive or allergic to (allergens).   Recognizing situations that cause stress.   Developing a plan to manage stress.  HOME CARE INSTRUCTIONS   Only take over-the-counter or  prescription medicines as directed by your health care provider.   Do not use anything on the skin without checking with your health care provider.   Keep baths or showers short (5 minutes) in warm (not hot) water. Use mild cleansers for bathing. These should be unscented. You may add nonperfumed bath oil to the bath water. It is best to avoid soap and bubble bath.   Immediately after a bath or shower, when the skin is still damp, apply a moisturizing ointment to the entire body. This ointment should be a petroleum ointment. This will seal in moisture and help prevent dryness. The thicker the ointment, the better. These should be unscented.   Keep fingernails cut short. Children with eczema may need to wear soft gloves or mittens at night after applying an ointment.   Dress in clothes made of cotton or cotton blends. Dress lightly, because heat increases itching.   A child with eczema should stay away from anyone with fever blisters or cold sores. The virus that causes fever blisters (herpes simplex) can cause a serious skin infection in children with eczema. SEEK MEDICAL CARE IF:   Your itching interferes with sleep.   Your rash gets worse or is not better within 1 week after starting treatment.   You see pus or soft yellow scabs in the rash area.   You have a fever.   You have a rash flare-up after contact with someone who has fever blisters.  Document Released: 10/24/2000 Document Revised: 08/17/2013 Document Reviewed: 05/30/2013 [employer] Patient Information 2015 [employer]. This information   is not intended to replace advice given to you by your health care provider. Make sure you discuss any questions you have with your health care provider.  

## 2014-06-22 NOTE — Addendum Note (Signed)
Addended by: Roselee Culver on: 06/22/2014 04:12 PM   Modules accepted: Orders

## 2014-08-03 ENCOUNTER — Other Ambulatory Visit: Payer: Self-pay

## 2014-08-03 MED ORDER — NORETHINDRONE-ETH ESTRADIOL 0.4-35 MG-MCG PO TABS: 1.0000 | ORAL_TABLET | Freq: Every day | ORAL | Status: DC

## 2014-08-03 NOTE — Telephone Encounter (Signed)
Last AEX: 06/17/13 Last refill:06/17/13 #1 pack X 11 Current AEX:08/23/14  Sent 1 mth suppy. Pt has a AEX on 08/23/14 Encounter closed

## 2014-08-23 ENCOUNTER — Ambulatory Visit: Payer: BC Managed Care – PPO | Admitting: Internal Medicine

## 2014-09-26 ENCOUNTER — Ambulatory Visit (INDEPENDENT_AMBULATORY_CARE_PROVIDER_SITE_OTHER): Payer: BC Managed Care – PPO | Admitting: Family Medicine

## 2014-09-26 VITALS — BP 100/60 | HR 73 | Temp 97.4°F | Resp 18 | Ht 64.5 in | Wt 159.2 lb

## 2014-09-26 DIAGNOSIS — R059 Cough, unspecified: Secondary | ICD-10-CM

## 2014-09-26 DIAGNOSIS — J0110 Acute frontal sinusitis, unspecified: Secondary | ICD-10-CM

## 2014-09-26 DIAGNOSIS — R05 Cough: Secondary | ICD-10-CM

## 2014-09-26 DIAGNOSIS — R0981 Nasal congestion: Secondary | ICD-10-CM

## 2014-09-26 LAB — POCT CBC
GRANULOCYTE PERCENT: 66.3 % (ref 37–80)
HCT, POC: 45.9 % (ref 37.7–47.9)
Hemoglobin: 15.5 g/dL (ref 12.2–16.2)
LYMPH, POC: 2.9 (ref 0.6–3.4)
MCH: 30.9 pg (ref 27–31.2)
MCHC: 33.9 g/dL (ref 31.8–35.4)
MCV: 91.2 fL (ref 80–97)
MID (cbc): 0.7 (ref 0–0.9)
MPV: 7.9 fL (ref 0–99.8)
POC Granulocyte: 7.1 — AB (ref 2–6.9)
POC LYMPH %: 27.5 % (ref 10–50)
POC MID %: 6.2 %M (ref 0–12)
Platelet Count, POC: 281 10*3/uL (ref 142–424)
RBC: 5.03 M/uL (ref 4.04–5.48)
RDW, POC: 12.4 %
WBC: 10.7 10*3/uL — AB (ref 4.6–10.2)

## 2014-09-26 MED ORDER — IPRATROPIUM BROMIDE 0.03 % NA SOLN: 2.0000 | Freq: Four times a day (QID) | NASAL | Status: DC

## 2014-09-26 MED ORDER — AMOXICILLIN 500 MG PO CAPS: 1000.0000 mg | ORAL_CAPSULE | Freq: Two times a day (BID) | ORAL | Status: DC

## 2014-09-26 NOTE — Progress Notes (Signed)
[facility] [address] [place]  [id number] [phone]  Date:  09/26/2014   Name:  Brenda Rosales   DOB:  [birth year]   MRN:  [id number]  PCP:  Cathlean Cower, MD    Chief Complaint: Cough; Nasal Congestion; and Headache   History of Present Illness:  Brenda Rosales is a 28 y.o. very pleasant female patient who presents with the following:  Here today with illness.  Today is Tuesday.  Over the weekend she noted "just regular sinus sx;" she had some nasal congestoin.    She now has a cough, her sinuses are congested.  She feels like she has some possible wheezing.  She does have a history of asthma. She has not used her allegra over the last few days.   No fever, no aches or chills.  She notes a mild ST that she thinks is due to PND No GI symptoms.    She uses symbicort BID, and albuterol as needed.  However it has not seemed to help with this illness.    She had her A1c tested at work recently- was 5.8%  Lab Results  Component Value Date   HGBA1C 6.3 02/22/2014      Patient Active Problem List   Diagnosis Date Noted  . Hematochezia 02/21/2014  . Acute sinusitis 02/21/2014  . Other and unspecified noninfectious gastroenteritis and colitis(558.9) 02/21/2014  . Boil, face 06/22/2012  . IBS (irritable bowel syndrome) 01/08/2012  . Situational anxiety 01/08/2012  . Type II or unspecified type diabetes mellitus without mention of complication, uncontrolled 09/22/2011  . GERD 11/20/2010  . DYSPHAGIA 11/19/2010  . FATTY LIVER DISEASE 04/17/2008  . RHINOSINUSITIS, CHRONIC 12/09/2007  . ASTHMA 11/19/2007  . DYSMENORRHEA 11/19/2007  . ECZEMA 11/19/2007  . TB SKIN TEST, POSITIVE 11/19/2007    Past Medical History  Diagnosis Date  . ASTHMA 11/19/2007  . CHICKENPOX 11/19/2007  . Dysmenorrhea 11/19/2007  . DYSPHAGIA 11/19/2010  . ECZEMA 11/19/2007  . FATTY LIVER DISEASE 04/17/2008  . GERD 11/20/2010  . Infection of eyelid 08/07/2011  . RHINOSINUSITIS,  CHRONIC 12/09/2007  . TB SKIN TEST, POSITIVE 11/19/2007  . Esophageal stricture   . Esophagitis   . Duodenitis   . Anxiety   . Diabetes mellitus without complication     diet controlled  . Other and unspecified noninfectious gastroenteritis and colitis(558.9) 02/2014    Past Surgical History  Procedure Laterality Date  . Mole excision      x's 4  . Ct sinus ltd w/o cm  09/23/07    History  Substance Use Topics  . Smoking status: Never Smoker   . Smokeless tobacco: Never Used  . Alcohol Use: Yes     Comment: SOCIALLY    Family History  Problem Relation Age of Onset  . Adopted: Yes    No Known Allergies  Medication list has been reviewed and updated.  Current Outpatient Prescriptions on File Prior to Visit  Medication Sig Dispense Refill  . albuterol (PROVENTIL HFA;VENTOLIN HFA) 108 (90 BASE) MCG/ACT inhaler Inhale 2 puffs into the lungs every 6 (six) hours as needed for wheezing or shortness of breath. 1 Inhaler 11  . budesonide-formoterol (SYMBICORT) 160-4.5 MCG/ACT inhaler Inhale 2 puffs into the lungs 2 (two) times daily. 1 Inhaler 11  . fexofenadine (ALLEGRA) 180 MG tablet Take 180 mg by mouth daily as needed for allergies or rhinitis.    Marland Kitchen LORazepam (ATIVAN) 1 MG tablet Take 1 tablet (1 mg total) by mouth  every 8 (eight) hours as needed for anxiety. 60 tablet 1  . norethindrone-ethinyl estradiol (BALZIVA) 0.4-35 MG-MCG tablet Take 1 tablet by mouth daily. 1 Package 0  . tacrolimus (PROTOPIC) 0.1 % ointment Apply topically 2 (two) times daily. 100 g 1  . fluticasone (CUTIVATE) 0.05 % cream Apply 1 application topically daily.     . fluticasone (FLONASE) 50 MCG/ACT nasal spray Place 2 sprays into both nostrils daily. 16 g 11  . Pseudoephedrine HCl (SUDAFED 24 HOUR NON-DROWSY) 240 MG TB24 Take 240 mg by mouth daily as needed (congestion).    . [DISCONTINUED] metFORMIN (GLUCOPHAGE) 500 MG tablet Take 500 mg by mouth 2 (two) times daily with a meal.       No current  facility-administered medications on file prior to visit.    Review of Systems:  As per HPI- otherwise negative.   Physical Examination: Filed Vitals:   09/26/14 1640  BP: 100/60  Pulse: 73  Temp: 97.4 F (36.3 C)  Resp: 18   Filed Vitals:   09/26/14 1640  Height: 5' 4.5" (1.638 m)  Weight: 159 lb 3.2 oz (72.213 kg)   Body mass index is 26.91 kg/(m^2). Ideal Body Weight: Weight in (lb) to have BMI = 25: 147.6  GEN: WDWN, NAD, Non-toxic, A & O x 3, looks well HEENT: Atraumatic, Normocephalic. Neck supple. No masses, No LAD.  Bilateral TM wnl, oropharynx normal.  PEERL,EOMI.   Nasal congestion and sinus tenderness to percussion  Ears and Nose: No external deformity. CV: RRR, No M/G/R. No JVD. No thrill. No extra heart sounds. PULM: CTA B, no wheezes, crackles, rhonchi. No retractions. No resp. distress. No accessory muscle use. EXTR: No c/c/e NEURO Normal gait.  PSYCH: Normally interactive. Conversant. Not depressed or anxious appearing.  Calm demeanor.   Results for orders placed or performed in visit on 09/26/14  POCT CBC  Result Value Ref Range   WBC 10.7 (A) 4.6 - 10.2 K/uL   Lymph, poc 2.9 0.6 - 3.4   POC LYMPH PERCENT 27.5 10 - 50 %L   MID (cbc) 0.7 0 - 0.9   POC MID % 6.2 0 - 12 %M   POC Granulocyte 7.1 (A) 2 - 6.9   Granulocyte percent 66.3 37 - 80 %G   RBC 5.03 4.04 - 5.48 M/uL   Hemoglobin 15.5 12.2 - 16.2 g/dL   HCT, POC 45.9 37.7 - 47.9 %   MCV 91.2 80 - 97 fL   MCH, POC 30.9 27 - 31.2 pg   MCHC 33.9 31.8 - 35.4 g/dL   RDW, POC 12.4 %   Platelet Count, POC 281.0 142 - 424 K/uL   MPV 7.9 0 - 99.8 fL    Assessment and Plan: Acute frontal sinusitis, recurrence not specified - Plan: amoxicillin (AMOXIL) 500 MG capsule  Cough - Plan: POCT CBC  Sinus congestion - Plan: POCT CBC, ipratropium (ATROVENT) 0.03 % nasal spray  Treat for a sinus infection as above.  Follow-up if not better in the next few days- Sooner if worse.     Signed Lamar Blinks, MD

## 2014-09-26 NOTE — Patient Instructions (Signed)
We are going to treat you for a sinus infection with amoxicillin Use the atrovent nasal spray as needed for drainage Let me know if you are not better soon! Remember to use back up contraception while you are on abx and for 2 weeks after you finish the course

## 2014-10-10 ENCOUNTER — Encounter: Payer: Self-pay | Admitting: Family

## 2014-10-10 ENCOUNTER — Ambulatory Visit (INDEPENDENT_AMBULATORY_CARE_PROVIDER_SITE_OTHER): Payer: BC Managed Care – PPO | Admitting: Family

## 2014-10-10 VITALS — BP 110/82 | HR 78 | Temp 98.1°F | Resp 18 | Ht 63.5 in | Wt 163.0 lb

## 2014-10-10 DIAGNOSIS — J019 Acute sinusitis, unspecified: Secondary | ICD-10-CM

## 2014-10-10 DIAGNOSIS — H01003 Unspecified blepharitis right eye, unspecified eyelid: Secondary | ICD-10-CM | POA: Insufficient documentation

## 2014-10-10 MED ORDER — AMOXICILLIN-POT CLAVULANATE 875-125 MG PO TABS: 1.0000 | ORAL_TABLET | Freq: Two times a day (BID) | ORAL | Status: DC

## 2014-10-10 MED ORDER — ERYTHROMYCIN 5 MG/GM OP OINT: 1.0000 "application " | TOPICAL_OINTMENT | Freq: Four times a day (QID) | OPHTHALMIC | Status: DC

## 2014-10-10 NOTE — Assessment & Plan Note (Signed)
Symptoms and exam consistent with blepharitis. Start erythromycin ointment. Use baby shampoo diluted with water to cleanse eye. Use moist compresses to assist with discomfort as needed. Follow up if symptoms worsen or fail to improve following antibiotic use.

## 2014-10-10 NOTE — Progress Notes (Signed)
Subjective:    Patient ID: Brenda Rosales, female    DOB: [birth year], 28 y.o.   MRN: [id number]  Chief Complaint  Patient presents with  . Sinus issues    Having sinus pressure as well as swollen puffy eyes, right eye redness and discharge x2 weeks    HPI:  Brenda Rosales is a 28 y.o. female who presents today for an acute visit.  Acute symptoms started about 2 weeks ago. Was seen at an urgent care and treated with amoxicillin for a sinus infection. Recently completed amoxicillin and continues to have symptoms although improved. Currently experiencing sinus pressure and ear popping. Denies any coughing, fever, or chills.  Acute eye symptoms started this morning.right eye was noted to have redness and was crusted shut. 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 Has not tried any treatments for her eye. There is nothing that makes it better or worse.  No Known Allergies  Current Outpatient Prescriptions on File Prior to Visit  Medication Sig Dispense Refill  . albuterol (PROVENTIL HFA;VENTOLIN HFA) 108 (90 BASE) MCG/ACT inhaler Inhale 2 puffs into the lungs every 6 (six) hours as needed for wheezing or shortness of breath. 1 Inhaler 11  . amoxicillin (AMOXIL) 500 MG capsule Take 2 capsules (1,000 mg total) by mouth 2 (two) times daily. 40 capsule 0  . budesonide-formoterol (SYMBICORT) 160-4.5 MCG/ACT inhaler Inhale 2 puffs into the lungs 2 (two) times daily. 1 Inhaler 11  . fexofenadine (ALLEGRA) 180 MG tablet Take 180 mg by mouth daily as needed for allergies or rhinitis.    . fluticasone (CUTIVATE) 0.05 % cream Apply 1 application topically daily.     . fluticasone (FLONASE) 50 MCG/ACT nasal spray Place 2 sprays into both nostrils daily. 16 g 11  . ipratropium (ATROVENT) 0.03 % nasal spray Place 2 sprays into the nose 4 (four) times daily. 30 mL 6  . LORazepam (ATIVAN) 1 MG  tablet Take 1 tablet (1 mg total) by mouth every 8 (eight) hours as needed for anxiety. 60 tablet 1  . norethindrone-ethinyl estradiol (BALZIVA) 0.4-35 MG-MCG tablet Take 1 tablet by mouth daily. 1 Package 0  . Pseudoephedrine HCl (SUDAFED 24 HOUR NON-DROWSY) 240 MG TB24 Take 240 mg by mouth daily as needed (congestion).    . tacrolimus (PROTOPIC) 0.1 % ointment Apply topically 2 (two) times daily. 100 g 1  . [DISCONTINUED] metFORMIN (GLUCOPHAGE) 500 MG tablet Take 500 mg by mouth 2 (two) times daily with a meal.       No current facility-administered medications on file prior to visit.     Review of Systems    See HPI  Objective:    BP 110/82 mmHg  Pulse 78  Temp(Src) 98.1 F (36.7 C) (Oral)  Resp 18  Ht 5' 3.5" (1.613 m)  Wt 163 lb (73.936 kg)  BMI 28.42 kg/m2  SpO2 96%  LMP 09/10/2014 Nursing note and vital signs reviewed.  Physical Exam  Constitutional: She is oriented to person, place, and time. She appears well-developed and well-nourished. No distress.  HENT:  Right Ear: Hearing, tympanic membrane, external ear and ear canal normal.  Left Ear: Hearing, external ear and ear canal normal. Tympanic membrane is erythematous.  Nose: Right sinus exhibits maxillary sinus tenderness. Right sinus exhibits no frontal sinus tenderness. Left sinus exhibits maxillary sinus tenderness. Left sinus exhibits no frontal sinus tenderness.  Mouth/Throat: Uvula  is midline, oropharynx is clear and moist and mucous membranes are normal.  Eyes: Right eye exhibits discharge and exudate. Right conjunctiva is injected.  Cardiovascular: Normal rate, regular rhythm, normal heart sounds and intact distal pulses.   Pulmonary/Chest: Effort normal and breath sounds normal.  Lymphadenopathy:    She has cervical adenopathy.  Neurological: She is alert and oriented to person, place, and time.  Skin: Skin is warm and dry.  Psychiatric: She has a normal mood and affect. Her behavior is normal. Judgment  and thought content normal.       Assessment & Plan:

## 2014-10-10 NOTE — Patient Instructions (Signed)
Thank you for choosing [facility].  Summary/Instructions:  Your prescription(s) have been submitted to your pharmacy. Please take as directed and contact our office if you believe you are having problem(s) with the medication(s).  If your symptoms worsen or fail to improve, please contact our office for further instruction, or in case of emergency go directly to the emergency room at the closest medical facility.   Sinusitis Sinusitis is redness, soreness, and inflammation of the paranasal sinuses. Paranasal sinuses are air pockets within the bones of your face (beneath the eyes, the middle of the forehead, or above the eyes). In healthy paranasal sinuses, mucus is able to drain out, and air is able to circulate through them by way of your nose. However, when your paranasal sinuses are inflamed, mucus and air can become trapped. This can allow bacteria and other germs to grow and cause infection. Sinusitis can develop quickly and last only a short time (acute) or continue over a long period (chronic). Sinusitis that lasts for more than 12 weeks is considered chronic.  CAUSES  Causes of sinusitis include:  Allergies.  Structural abnormalities, such as displacement of the cartilage that separates your nostrils (deviated septum), which can decrease the air flow through your nose and sinuses and affect sinus drainage.  Functional abnormalities, such as when the small hairs (cilia) that line your sinuses and help remove mucus do not work properly or are not present. SIGNS AND SYMPTOMS  Symptoms of acute and chronic sinusitis are the same. The primary symptoms are pain and pressure around the affected sinuses. Other symptoms include:  Upper toothache.  Earache.  Headache.  Bad breath.  Decreased sense of smell and taste.  A cough, which worsens when you are lying flat.  Fatigue.  Fever.  Thick drainage from your nose, which often is green and may contain pus  (purulent).  Swelling and warmth over the affected sinuses. DIAGNOSIS  Your health care provider will perform a physical exam. During the exam, your health care provider may:  Look in your nose for signs of abnormal growths in your nostrils (nasal polyps).  Tap over the affected sinus to check for signs of infection.  View the inside of your sinuses (endoscopy) using an imaging device that has a light attached (endoscope). If your health care provider suspects that you have chronic sinusitis, one or more of the following tests may be recommended:  Allergy tests.  Nasal culture. A sample of mucus is taken from your nose, sent to a lab, and screened for bacteria.  Nasal cytology. A sample of mucus is taken from your nose and examined by your health care provider to determine if your sinusitis is related to an allergy. TREATMENT  Most cases of acute sinusitis are related to a viral infection and will resolve on their own within 10 days. Sometimes medicines are prescribed to help relieve symptoms (pain medicine, decongestants, nasal steroid sprays, or saline sprays).  However, for sinusitis related to a bacterial infection, your health care provider will prescribe antibiotic medicines. These are medicines that will help kill the bacteria causing the infection.  Rarely, sinusitis is caused by a fungal infection. In theses cases, your health care provider will prescribe antifungal medicine. For some cases of chronic sinusitis, surgery is needed. Generally, these are cases in which sinusitis recurs more than 3 times per year, despite other treatments. HOME CARE INSTRUCTIONS   Drink plenty of water. Water helps thin the mucus so your sinuses can drain more easily.  Use a humidifier.  Inhale steam 3 to 4 times a day (for example, sit in the bathroom with the shower running).  Apply a warm, moist washcloth to your face 3 to 4 times a day, or as directed by your health care provider.  Use  saline nasal sprays to help moisten and clean your sinuses.  Take medicines only as directed by your health care provider.  If you were prescribed either an antibiotic or antifungal medicine, finish it all even if you start to feel better. SEEK IMMEDIATE MEDICAL CARE IF:  You have increasing pain or severe headaches.  You have nausea, vomiting, or drowsiness.  You have swelling around your face.  You have vision problems.  You have a stiff neck.  You have difficulty breathing. MAKE SURE YOU:   Understand these instructions.  Will watch your condition.  Will get help right away if you are not doing well or get worse. Document Released: 10/27/2005 Document Revised: 03/13/2014 Document Reviewed: 11/11/2011 [facility] Patient Information 2015 [place], [place]. This information is not intended to replace advice given to you by your health care provider. Make sure you discuss any questions you have with your health care provider.  Blepharitis Blepharitis is redness, soreness, and swelling (inflammation) of one or both eyelids. It may be caused by an allergic reaction or a bacterial infection. Blepharitis may also be associated with reddened, scaly skin (seborrhea) of the scalp and eyebrows. While you sleep, eye discharge may cause your eyelashes to stick together. Your eyelids may itch, burn, swell, and may lose their lashes. These will grow back. Your eyes may become sensitive. Blepharitis may recur and need repeated treatment. If this is the case, you may require further evaluation by an eye specialist (ophthalmologist). HOME CARE INSTRUCTIONS   Keep your hands clean.  Use a clean towel each time you dry your eyelids. Do not use this towel to clean other areas. Do not share a towel or makeup with anyone.  Wash your eyelids with warm water or warm water mixed with a small amount of baby shampoo. Do this twice a day or as often as needed.  Wash your face and eyebrows at least once a  day.  Use warm compresses 2 times a day for 10 minutes at a time, or as directed by your caregiver.  Apply antibiotic ointment as directed by your caregiver.  Avoid rubbing your eyes.  Avoid wearing makeup until you get better.  Follow up with your caregiver as directed. SEEK IMMEDIATE MEDICAL CARE IF:   You have pain, redness, or swelling that gets worse or spreads to other parts of your face.  Your vision changes, or you have pain when looking at lights or moving objects.  You have a fever.  Your symptoms continue for longer than 2 to 4 days or become worse. MAKE SURE YOU:   Understand these instructions.  Will watch your condition.  Will get help right away if you are not doing well or get worse. Document Released: 10/24/2000 Document Revised: 01/19/2012 Document Reviewed: 12/04/2010 [facility] Patient Information 2015 [place], [place]. This information is not intended to replace advice given to you by your health care provider. Make sure you discuss any questions you have with your health care provider.

## 2014-10-10 NOTE — Assessment & Plan Note (Signed)
Symptoms and exam consistent with bacterial sinusitis. Start Augmentin. Continue over-the-counter medications as needed for symptom relief. If symptoms worsen or fail to improve please return to clinic.

## 2014-10-10 NOTE — Progress Notes (Signed)
Pre visit review using our clinic review tool, if applicable. No additional management support is needed unless otherwise documented below in the visit note. 

## 2015-01-22 ENCOUNTER — Ambulatory Visit (INDEPENDENT_AMBULATORY_CARE_PROVIDER_SITE_OTHER): Payer: BC Managed Care – PPO | Admitting: Physician Assistant

## 2015-01-22 VITALS — BP 110/62 | HR 66 | Temp 98.1°F | Resp 20 | Ht 63.5 in | Wt 165.0 lb

## 2015-01-22 DIAGNOSIS — H6983 Other specified disorders of Eustachian tube, bilateral: Secondary | ICD-10-CM | POA: Diagnosis not present

## 2015-01-22 DIAGNOSIS — J309 Allergic rhinitis, unspecified: Secondary | ICD-10-CM

## 2015-01-22 MED ORDER — PSEUDOEPHEDRINE HCL ER 120 MG PO TB12: 120.0000 mg | ORAL_TABLET | Freq: Two times a day (BID) | ORAL | Status: DC | PRN

## 2015-01-22 MED ORDER — IPRATROPIUM BROMIDE 0.03 % NA SOLN: 2.0000 | Freq: Two times a day (BID) | NASAL | Status: DC

## 2015-01-22 NOTE — Progress Notes (Signed)
Subjective:    Patient ID: Brenda Rosales, female    DOB: Feb 15, 1986, 29 y.o.   MRN: [id number]  HPI  This is a 29 year old female with PMH asthma, recurrent sinusitis, eczema and DM who is presenting with nasal congestion x 2 weeks and sore throat and bilateral otalgia x 2 days. Has problems with chronic allergies. She is taking allegra-D and flonase every day. Had nasal discharge initially but now just congested. Ear pain described as "stuffed up" and hurts to swallow. She denies fever, chills, cough, SOB or wheezing. 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 Typically gets sinus infections 1-2x per year. She is on symbicort daily for her asthma. She hasn't had to use albuterol in several months. Pt has a PCP who manages her asthma and a dermatologist who manages her eczema. She states she has seen an allergist before and has reacted to several different allergens. She has never tried allergy shots or any allergy medication other than allegra and flonase.   Review of Systems  Constitutional: Negative for fever and chills.  HENT: Positive for congestion, ear pain and sore throat. Negative for ear discharge and sinus pressure.   Eyes: Negative for redness.  Respiratory: Negative for cough.   Gastrointestinal: Negative for nausea, vomiting and abdominal pain.  Skin: Positive for color change.  Allergic/Immunologic: Positive for environmental allergies.  Hematological: Negative for adenopathy.   Patient Active Problem List   Diagnosis Date Noted  . Blepharitis of eyelid of right eye 10/10/2014  . Hematochezia 02/21/2014  . Other and unspecified noninfectious gastroenteritis and colitis(558.9) 02/21/2014  . Boil, face 06/22/2012  . IBS (irritable bowel syndrome) 01/08/2012  . Situational anxiety 01/08/2012  . Type II or unspecified type diabetes mellitus without mention of complication, uncontrolled 09/22/2011  . GERD 11/20/2010  . DYSPHAGIA 11/19/2010  . FATTY LIVER DISEASE  04/17/2008  . RHINOSINUSITIS, CHRONIC 12/09/2007  . ASTHMA 11/19/2007  . ECZEMA 11/19/2007  . TB SKIN TEST, POSITIVE 11/19/2007   Prior to Admission medications   Medication Sig Start Date End Date Taking? Authorizing Provider  albuterol (PROVENTIL HFA;VENTOLIN HFA) 108 (90 BASE) MCG/ACT inhaler Inhale 2 puffs into the lungs every 6 (six) hours as needed for wheezing or shortness of breath. 02/21/14  Yes Biagio Borg, MD  budesonide-formoterol [facility]) 160-4.5 MCG/ACT inhaler Inhale 2 puffs into the lungs 2 (two) times daily. 02/21/14  Yes Biagio Borg, MD  fexofenadine (ALLEGRA) 180 MG tablet Take 180 mg by mouth daily as needed for allergies or rhinitis.   Yes Historical Provider, MD  fluticasone (FLONASE) 50 MCG/ACT nasal spray Place 2 sprays into both nostrils daily. 02/21/14  Yes Biagio Borg, MD  LORazepam (ATIVAN) 1 MG tablet Take 1 tablet (1 mg total) by mouth every 8 (eight) hours as needed for anxiety. 06/22/14  Yes Roselee Culver, MD  norethindrone-ethinyl estradiol [facility] Humphrey) 0.4-35 MG-MCG tablet Take 1 tablet by mouth daily. 08/03/14  Yes Applegate, MD  tacrolimus (PROTOPIC) 0.1 % ointment Apply topically 2 (two) times daily. 06/22/14  Yes Roselee Culver, MD                 No Known Allergies  Patient's social and family history were reviewed.     Objective:   Physical Exam  Constitutional: She is oriented to person, place, and time. She appears well-developed and well-nourished. No distress.  HENT:  Head: Normocephalic and atraumatic.  Right Ear: Hearing, external ear and  ear canal normal. Tympanic membrane is retracted. A middle ear effusion is present.  Left Ear: Hearing, external ear and ear canal normal. Tympanic membrane is retracted. A middle ear effusion is present.  Nose: Mucosal edema present. Right sinus exhibits no maxillary sinus tenderness and no frontal sinus tenderness. Left sinus exhibits no maxillary sinus tenderness and no frontal sinus  tenderness.  Mouth/Throat: Uvula is midline and mucous membranes are normal. Posterior oropharyngeal erythema present. No oropharyngeal exudate or posterior oropharyngeal edema.  Eyes: Conjunctivae are normal. Right eye exhibits no discharge. Left eye exhibits no discharge. No scleral icterus.  Bilateral upper and lower lids with eczematous and edematous skin, worse in the medial corners  Cardiovascular: Normal rate, regular rhythm, normal heart sounds, intact distal pulses and normal pulses.   No murmur heard. Pulmonary/Chest: Effort normal and breath sounds normal. No respiratory distress. She has no wheezes. She has no rhonchi. She has no rales.  Musculoskeletal: Normal range of motion.  Lymphadenopathy:       Head (right side): No submental, no submandibular and no tonsillar adenopathy present.       Head (left side): No submental, no submandibular and no tonsillar adenopathy present.    She has no cervical adenopathy.  Neurological: She is alert and oriented to person, place, and time.  Skin: Skin is warm and dry.  Psychiatric: She has a normal mood and affect. Her speech is normal and behavior is normal. Thought content normal.  BP 110/62 mmHg  Pulse 66  Temp(Src) 98.1 F (36.7 C) (Oral)  Resp 20  Ht 5' 3.5" (1.613 m)  Wt 165 lb (74.844 kg)  BMI 28.77 kg/m2  SpO2 100%  LMP 12/25/2014     Assessment & Plan:  1. Eustachian tube dysfunction, bilateral 2. Allergic rhinitis Gave pt atrovent to use with flonase during this acute illness and to help with ETD. Advised she go back to regular allegra and take 12 hour sudafed once a day for the eustachian tube dysfunction. Made referral to allergist for uncontrolled allergies and recurrent sinusitis.    - pseudoephedrine (SUDAFED 12 HOUR) 120 MG 12 hr tablet; Take 1 tablet (120 mg total) by mouth every 12 (twelve) hours as needed for congestion.  Dispense: 30 tablet; Refill: 0 - ipratropium (ATROVENT) 0.03 % nasal spray; Place 2 sprays  into both nostrils 2 (two) times daily.  Dispense: 30 mL; Refill: 0 - Ambulatory referral to Allergy   Benjaman Pott. Drenda Freeze, MHS [facility]  01/24/2015

## 2015-01-22 NOTE — Patient Instructions (Addendum)
Start taking regular allegra daily. Take sudafed once a day. Use atrovent twice a day in addition to the flonase. Follow up with your PCP if your symptoms do not improve or if they worsen.

## 2015-01-30 ENCOUNTER — Telehealth: Payer: Self-pay | Admitting: Internal Medicine

## 2015-01-30 ENCOUNTER — Ambulatory Visit (INDEPENDENT_AMBULATORY_CARE_PROVIDER_SITE_OTHER): Payer: BC Managed Care – PPO | Admitting: Nurse Practitioner

## 2015-01-30 ENCOUNTER — Other Ambulatory Visit (INDEPENDENT_AMBULATORY_CARE_PROVIDER_SITE_OTHER): Payer: BC Managed Care – PPO

## 2015-01-30 ENCOUNTER — Encounter: Payer: Self-pay | Admitting: Nurse Practitioner

## 2015-01-30 VITALS — BP 90/60 | HR 67 | Ht 63.6 in | Wt 156.6 lb

## 2015-01-30 DIAGNOSIS — R142 Eructation: Secondary | ICD-10-CM

## 2015-01-30 DIAGNOSIS — R197 Diarrhea, unspecified: Secondary | ICD-10-CM | POA: Diagnosis not present

## 2015-01-30 LAB — CBC WITH DIFFERENTIAL/PLATELET
BASOS ABS: 0 10*3/uL (ref 0.0–0.1)
Basophils Relative: 0.5 % (ref 0.0–3.0)
EOS ABS: 0.3 10*3/uL (ref 0.0–0.7)
Eosinophils Relative: 4.4 % (ref 0.0–5.0)
HCT: 42.7 % (ref 36.0–46.0)
HEMOGLOBIN: 14.9 g/dL (ref 12.0–15.0)
Lymphocytes Relative: 30.6 % (ref 12.0–46.0)
Lymphs Abs: 2.4 10*3/uL (ref 0.7–4.0)
MCHC: 34.8 g/dL (ref 30.0–36.0)
MCV: 88.3 fl (ref 78.0–100.0)
MONOS PCT: 5.6 % (ref 3.0–12.0)
Monocytes Absolute: 0.4 10*3/uL (ref 0.1–1.0)
NEUTROS ABS: 4.6 10*3/uL (ref 1.4–7.7)
NEUTROS PCT: 58.9 % (ref 43.0–77.0)
PLATELETS: 275 10*3/uL (ref 150.0–400.0)
RBC: 4.84 Mil/uL (ref 3.87–5.11)
RDW: 12.5 % (ref 11.5–15.5)
WBC: 7.8 10*3/uL (ref 4.0–10.5)

## 2015-01-30 LAB — C-REACTIVE PROTEIN: CRP: 0.4 mg/dL — ABNORMAL LOW (ref 0.5–20.0)

## 2015-01-30 LAB — SEDIMENTATION RATE: SED RATE: 28 mm/h — AB (ref 0–22)

## 2015-01-30 MED ORDER — ONDANSETRON HCL 4 MG PO TABS: 4.0000 mg | ORAL_TABLET | Freq: Four times a day (QID) | ORAL | Status: DC | PRN

## 2015-01-30 NOTE — Progress Notes (Signed)
     History of Present Illness:   Patient is a 29 year old female known to Dr. Henrene Pastor. She has a history of GERD  /  peptic stricture requiring dilation and fatty liver disease.  Patient was seen June 2015 for an ED follow up. She had presented to ED with abdominal pain, nausea, vomiting, and diarrhea. CTscan revealed enteritis and sigmoid colitis. Symptoms felt to be consistent with infectious gastroenteritis.   Brenda Rosales presents today with a 5 day history of loose, nonbloody stool and sour belches. She has been trying to limit food intake but resumption of yesterday caused recurrent loose stool. Today she vomited clear emesis. No fevers. No associated abdominal pain. No recent antibiotics. This acute illness is a ldifferent than that of last April in that there is no associated abdominal pain. Patient  felt fine from a GI standpoint between April 2015 and current illness.   Current Medications, Allergies, Past Medical History, Past Surgical History, Family History and Social History were reviewed in [employer] record.    Physical Exam: General: Pleasant, well developed , white female in no acute distress Head: Normocephalic and atraumatic Eyes:  sclerae anicteric, conjunctiva pink  Ears: Normal auditory acuity Lungs: Clear throughout to auscultation Heart: Regular rate and rhythm Abdomen: Soft, non distended, non-tender. No masses, no hepatomegaly. Normal bowel sounds Musculoskeletal: Symmetrical with no gross deformities  Extremities: No edema  Neurological: Alert oriented x 4, grossly nonfocal Psychological:  Alert and cooperative. Normal mood and affect  Assessment and Recommendations:  29 year old female with five-day history of loose stool, sour belching. She has mild nausea with an episode of "gagging" this am. Patient had similar symptoms in April 2015 when CT scan revealed enterocolitis. though this time no associated abdominal pain. Etiology of recent  diarrhea / belching unclear but infectious not excluded. She is feeling a little better now. Plan is to obtain some basic labs, trial of probiotics and low residue diet for the next few days.  Further workup will depend on clinical course

## 2015-01-30 NOTE — Telephone Encounter (Signed)
Pt states she has been having diarrhea now for 4 days. States her stool is lighter in color also. Reports feeling a gurgle in her stomach and then an almost explosive stool. Also reports "burping eggs", states they smell like sulfur. Pt requests to be seen. Pt scheduled to see Alonza Bogus PA today at 3pm. Pt aware.

## 2015-01-30 NOTE — Patient Instructions (Addendum)
Your physician has requested that you go to the basement for the following lab work before leaving today: CBC/diff, ESR, CRP  We have sent the following medications to your pharmacy for you to pick up at your convenience:  zofran  Please purchase align which is over the counter.  It will put the good bacteria back into your colon.  Take one daily for a month.  Coupon provided.  Today you have been given a low residue diet booklet to read and follow.   Call us in 7-10 days for a condition update.    I appreciate the opportunity to care for you.

## 2015-01-30 NOTE — Telephone Encounter (Signed)
Pt actually scheduled with Tye Savoy NP today at 3pm.

## 2015-01-31 ENCOUNTER — Ambulatory Visit: Payer: BC Managed Care – PPO | Admitting: Obstetrics and Gynecology

## 2015-02-03 ENCOUNTER — Encounter: Payer: Self-pay | Admitting: Nurse Practitioner

## 2015-02-06 NOTE — Progress Notes (Signed)
Agree 

## 2015-02-08 ENCOUNTER — Ambulatory Visit (INDEPENDENT_AMBULATORY_CARE_PROVIDER_SITE_OTHER): Payer: BC Managed Care – PPO | Admitting: Certified Nurse Midwife

## 2015-02-08 ENCOUNTER — Encounter: Payer: Self-pay | Admitting: Certified Nurse Midwife

## 2015-02-08 VITALS — BP 110/72 | HR 72 | Resp 20 | Ht 63.25 in | Wt 159.0 lb

## 2015-02-08 DIAGNOSIS — Z01419 Encounter for gynecological examination (general) (routine) without abnormal findings: Secondary | ICD-10-CM

## 2015-02-08 DIAGNOSIS — Z3041 Encounter for surveillance of contraceptive pills: Secondary | ICD-10-CM | POA: Diagnosis not present

## 2015-02-08 DIAGNOSIS — Z124 Encounter for screening for malignant neoplasm of cervix: Secondary | ICD-10-CM | POA: Diagnosis not present

## 2015-02-08 MED ORDER — NORETHINDRONE-ETH ESTRADIOL 0.4-35 MG-MCG PO TABS: 1.0000 | ORAL_TABLET | Freq: Every day | ORAL | Status: DC

## 2015-02-08 NOTE — Patient Instructions (Signed)

## 2015-02-08 NOTE — Progress Notes (Signed)
29 y.o. G0P0 Single  Hispanic Fe here for annual exam. Periods normal, no issues. Contraception working well. Not sexually at present.  Sees Dr.John as PCP as needed and aex./ medication management..Previous concern for Type 2 diabetes, but diet controlled, no issues now. No health issues today.  Patient's last menstrual period was 01/26/2015.          Sexually active: Yes.    The current method of family planning is OCP (estrogen/progesterone).    Exercising: Yes.    cardio & weights Smoker:  no  Health Maintenance: Pap: 06-17-13 neg MMG:  none Colonoscopy:  Within 10 yrs BMD:   none TDaP:  2010 Labs: none Self breast exam: done occ   reports that she has never smoked. She has never used smokeless tobacco. She reports that she drinks about 0.6 oz of alcohol per week. She reports that she does not use illicit drugs.  Past Medical History  Diagnosis Date  . ASTHMA 11/19/2007  . CHICKENPOX 11/19/2007  . Dysmenorrhea 11/19/2007  . DYSPHAGIA 11/19/2010  . ECZEMA 11/19/2007  . FATTY LIVER DISEASE 04/17/2008  . GERD 11/20/2010  . Infection of eyelid 08/07/2011  . RHINOSINUSITIS, CHRONIC 12/09/2007  . TB SKIN TEST, POSITIVE 11/19/2007  . Esophageal stricture   . Esophagitis   . Duodenitis   . Anxiety   . Diabetes mellitus without complication     diet controlled  . Other and unspecified noninfectious gastroenteritis and colitis(558.9) 02/2014    Past Surgical History  Procedure Laterality Date  . Mole excision      x's 4  . Ct sinus ltd w/o cm  09/23/07    Current Outpatient Prescriptions  Medication Sig Dispense Refill  . albuterol (PROVENTIL HFA;VENTOLIN HFA) 108 (90 BASE) MCG/ACT inhaler Inhale 2 puffs into the lungs every 6 (six) hours as needed for wheezing or shortness of breath. 1 Inhaler 11  . budesonide-formoterol (SYMBICORT) 160-4.5 MCG/ACT inhaler Inhale 2 puffs into the lungs 2 (two) times daily. 1 Inhaler 11  . fexofenadine (ALLEGRA) 180 MG tablet Take 180 mg by mouth daily as  needed for allergies or rhinitis.    . fluticasone (FLONASE) 50 MCG/ACT nasal spray Place 2 sprays into both nostrils daily. 16 g 11  . ipratropium (ATROVENT) 0.03 % nasal spray Place 2 sprays into both nostrils 2 (two) times daily. 30 mL 0  . LORazepam (ATIVAN) 1 MG tablet Take 1 tablet (1 mg total) by mouth every 8 (eight) hours as needed for anxiety. 60 tablet 1  . norethindrone-ethinyl estradiol (BALZIVA) 0.4-35 MG-MCG tablet Take 1 tablet by mouth daily. 1 Package 0  . ondansetron (ZOFRAN) 4 MG tablet Take 1 tablet (4 mg total) by mouth every 6 (six) hours as needed for nausea or vomiting. 20 tablet 0  . Probiotic Product (ALIGN) 4 MG CAPS Take 1 capsule by mouth daily. Take for a month    . tacrolimus (PROTOPIC) 0.1 % ointment Apply topically 2 (two) times daily. 100 g 1  . [DISCONTINUED] metFORMIN (GLUCOPHAGE) 500 MG tablet Take 500 mg by mouth 2 (two) times daily with a meal.       No current facility-administered medications for this visit.    Family History  Problem Relation Age of Onset  . Adopted: Yes    ROS:  Pertinent items are noted in HPI.  Otherwise, a comprehensive ROS was negative.  Exam:   BP 110/72 mmHg  Pulse 72  Resp 20  Ht 5' 3.25" (1.607 m)  Wt 159 lb (  72.122 kg)  BMI 27.93 kg/m2  LMP 01/26/2015 Height: 5' 3.25" (160.7 cm) Ht Readings from Last 3 Encounters:  02/08/15 5' 3.25" (1.607 m)  01/30/15 5' 3.6" (1.615 m)  01/22/15 5' 3.5" (1.613 m)    General appearance: alert, cooperative and appears stated age Head: Normocephalic, without obvious abnormality, atraumatic Neck: no adenopathy, supple, symmetrical, trachea midline and thyroid normal to inspection and palpation Lungs: clear to auscultation bilaterally Breasts: normal appearance, no masses or tenderness, No nipple retraction or dimpling, No nipple discharge or bleeding, No axillary or supraclavicular adenopathy Heart: regular rate and rhythm Abdomen: soft, non-tender; no masses,  no  organomegaly Extremities: extremities normal, atraumatic, no cyanosis or edema Skin: Skin color, texture, turgor normal. No rashes or lesions Lymph nodes: Cervical, supraclavicular, and axillary nodes normal. No abnormal inguinal nodes palpated Neurologic: Grossly normal   Pelvic: External genitalia:  no lesions              Urethra:  normal appearing urethra with no masses, tenderness or lesions              Bartholin's and Skene's: normal                 Vagina: normal appearing vagina with normal color and discharge, no lesions              Cervix: normal, non tender, no lesions              Pap taken: Yes.   Bimanual Exam:  Uterus:  normal size, contour, position, consistency, mobility, non-tender              Adnexa: normal adnexa and no mass, fullness, tenderness               Rectovaginal: Confirms               Anus:  normal appearance  Chaperone present: Yes  A:  Well Woman with normal exam  Contraception OCP desired  Asthma with PCP management  P:   Reviewed health and wellness pertinent to exam  Rx Balziva see order  Pap smear taken today with HPV reflex   counseled on breast self exam, STD prevention, HIV risk factors and prevention, use and side effects of OCP's, adequate intake of calcium and vitamin D, diet and exercise  return annually or prn  An After Visit Summary was printed and given to the patient.

## 2015-02-08 NOTE — Progress Notes (Signed)
Reviewed personally.  M. Suzanne Raksha Wolfgang, MD.  

## 2015-02-09 LAB — IPS PAP TEST WITH REFLEX TO HPV

## 2015-03-01 ENCOUNTER — Encounter: Payer: Self-pay | Admitting: Physician Assistant

## 2015-03-01 DIAGNOSIS — J309 Allergic rhinitis, unspecified: Secondary | ICD-10-CM | POA: Insufficient documentation

## 2015-05-07 ENCOUNTER — Other Ambulatory Visit: Payer: Self-pay

## 2015-05-22 ENCOUNTER — Encounter: Payer: Self-pay | Admitting: Internal Medicine

## 2015-06-13 ENCOUNTER — Ambulatory Visit (INDEPENDENT_AMBULATORY_CARE_PROVIDER_SITE_OTHER): Payer: BC Managed Care – PPO | Admitting: Emergency Medicine

## 2015-06-13 VITALS — BP 122/80 | HR 73 | Temp 99.1°F | Resp 16 | Ht 64.0 in | Wt 160.0 lb

## 2015-06-13 DIAGNOSIS — R591 Generalized enlarged lymph nodes: Secondary | ICD-10-CM | POA: Diagnosis not present

## 2015-06-13 DIAGNOSIS — L0211 Cutaneous abscess of neck: Secondary | ICD-10-CM

## 2015-06-13 LAB — POCT CBC
GRANULOCYTE PERCENT: 60.8 % (ref 37–80)
HCT, POC: 42.3 % (ref 37.7–47.9)
Hemoglobin: 14.3 g/dL (ref 12.2–16.2)
LYMPH, POC: 2.5 (ref 0.6–3.4)
MCH, POC: 30.6 pg (ref 27–31.2)
MCHC: 33.9 g/dL (ref 31.8–35.4)
MCV: 90.3 fL (ref 80–97)
MID (cbc): 0.7 (ref 0–0.9)
MPV: 7.3 fL (ref 0–99.8)
PLATELET COUNT, POC: 304 10*3/uL (ref 142–424)
POC Granulocyte: 5 (ref 2–6.9)
POC LYMPH %: 30.2 % (ref 10–50)
POC MID %: 9 %M (ref 0–12)
RBC: 4.68 M/uL (ref 4.04–5.48)
RDW, POC: 12.8 %
WBC: 8.3 10*3/uL (ref 4.6–10.2)

## 2015-06-13 MED ORDER — MUPIROCIN 2 % EX OINT: 1.0000 "application " | TOPICAL_OINTMENT | Freq: Two times a day (BID) | CUTANEOUS | Status: DC

## 2015-06-13 MED ORDER — DOXYCYCLINE HYCLATE 100 MG PO TABS: 100.0000 mg | ORAL_TABLET | Freq: Two times a day (BID) | ORAL | Status: DC

## 2015-06-13 NOTE — Progress Notes (Addendum)
Patient ID: Brenda Rosales, female   DOB: [birth year], 29 y.o.   MRN: [id number]    This chart was scribed for Nena Jordan, MD by [facility], medical scribe at [facility].The patient was seen in exam room 07 and the patient's care was started at 1:36 PM.  Chief Complaint:  Chief Complaint  Patient presents with  . boil on back of neck    x 5 days  . Fatigue    x 5 days  . swelling on neck    x 5 days   HPI: Brenda Rosales is a 29 y.o. female who reports to [facility] today complaining of a cyst on the back her neck which she first noticed five days ago. The area is warm to the touch. Over the past few days she has noticed her lymph node has been more swollen and she has been more fatigue.  Warm compresses for relief.  Past Medical History  Diagnosis Date  . ASTHMA 11/19/2007  . CHICKENPOX 11/19/2007  . Dysmenorrhea 11/19/2007  . DYSPHAGIA 11/19/2010  . ECZEMA 11/19/2007  . FATTY LIVER DISEASE 04/17/2008  . GERD 11/20/2010  . Infection of eyelid 08/07/2011  . RHINOSINUSITIS, CHRONIC 12/09/2007  . TB SKIN TEST, POSITIVE 11/19/2007  . Esophageal stricture   . Esophagitis   . Duodenitis   . Anxiety   . Diabetes mellitus without complication     diet controlled  . Other and unspecified noninfectious gastroenteritis and colitis(558.9) 02/2014   Past Surgical History  Procedure Laterality Date  . Mole excision      x's 4  . Ct sinus ltd w/o cm  09/23/07   History   Social History  . Marital Status: Single    Spouse Name: N/A  . Number of Children: 0  . Years of Education: N/A   Occupational History  . Teacher    Social History Main Topics  . Smoking status: Never Smoker   . Smokeless tobacco: Never Used  . Alcohol Use: 0.6 oz/week    1 Standard drinks or equivalent per week     Comment: SOCIALLY  . Drug Use: No  . Sexual Activity:    Partners: Male    Birth Control/ Protection: Pill     Comment: Hulda Humphrey   Other Topics Concern  . None   Social History  Narrative   Adopted from [place].    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 Adopted: Yes   No Known Allergies Prior to Admission medications   Medication Sig Start Date End Date Taking? Authorizing Provider  albuterol (PROVENTIL HFA;VENTOLIN HFA) 108 (90 BASE) MCG/ACT inhaler Inhale 2 puffs into the lungs every 6 (six) hours as needed for wheezing or shortness of breath. 02/21/14  Yes Biagio Borg, MD  budesonide-formoterol [facility]) 160-4.5 MCG/ACT inhaler Inhale 2 puffs into the lungs 2 (two) times daily. 02/21/14  Yes Biagio Borg, MD  fexofenadine (ALLEGRA) 180 MG tablet Take 180 mg by mouth daily as needed for allergies or rhinitis.   Yes Historical Provider, MD  ipratropium (ATROVENT) 0.03 % nasal spray Place 2 sprays into both nostrils 2 (two) times daily. 01/22/15  Yes Bennett Scrape V, PA-C  LORazepam (ATIVAN) 1 MG tablet Take 1 tablet (1 mg total) by mouth every 8 (eight) hours as needed for anxiety. 06/22/14  Yes Roselee Culver, MD  norethindrone-ethinyl  estradiol (BALZIVA) 0.4-35 MG-MCG tablet Take 1 tablet by mouth daily. 02/08/15  Yes Regina Eck, CNM  ondansetron (ZOFRAN) 4 MG tablet Take 1 tablet (4 mg total) by mouth every 6 (six) hours as needed for nausea or vomiting. 01/30/15  Yes Willia Craze, NP  tacrolimus (PROTOPIC) 0.1 % ointment Apply topically 2 (two) times daily. 06/22/14  Yes Roselee Culver, MD  fluticasone (FLONASE) 50 MCG/ACT nasal spray Place 2 sprays into both nostrils daily. Patient not taking: Reported on 06/13/2015 02/21/14   Biagio Borg, MD  Probiotic Product (ALIGN) 4 MG CAPS Take 1 capsule by mouth daily. Take for a month    Historical Provider, MD   ROS: The patient denies fevers, chills, night sweats, unintentional weight loss, chest pain, palpitations, wheezing, dyspnea on exertion, nausea, vomiting, abdominal pain, dysuria, hematuria,  melena, numbness, weakness, or tingling.   All other systems have been reviewed and were otherwise negative with the exception of those mentioned in the HPI and as above.    PHYSICAL EXAM: Filed Vitals:   06/13/15 1156  BP: 122/80  Pulse: 73  Temp: 99.1 F (37.3 C)  Resp: 16   Body mass index is 27.45 kg/(m^2).  General: Alert, no acute distress HEENT:  Normocephalic, atraumatic, oropharynx patent. Eye: [employer] Cardiovascular:  Regular rate and rhythm, no rubs murmurs or gallops.  No Carotid bruits, radial pulse intact. No pedal edema.  Respiratory: Clear to auscultation bilaterally.  No wheezes, rales, or rhonchi.  No cyanosis, no use of accessory musculature Abdominal: No organomegaly, abdomen is soft and non-tender, positive bowel sounds.  No masses. Musculoskeletal: Gait intact. No edema, tenderness Skin: No rashes. There is a 2 x 3 cm abscess at the nape of the neck. There is also a 1 x 1 cm right supraclavicular node. Neurologic: Facial musculature symmetric. Psychiatric: Patient acts appropriately throughout our interaction. Lymphatic: No cervical or submandibular lymphadenopathy Genitourinary/Anorectal: No acute findings  LABS: Results for orders placed or performed in visit on 02/08/15  PAP with Reflex to HPV (IPS)  Result Value Ref Range   COMMENTS: [facility]    Results for orders placed or performed in visit on 06/13/15  POCT CBC  Result Value Ref Range   WBC 8.3 4.6 - 10.2 K/uL   Lymph, poc 2.5 0.6 - 3.4   POC LYMPH PERCENT 30.2 10 - 50 %L   MID (cbc) 0.7 0 - 0.9   POC MID % 9.0 0 - 12 %M   POC Granulocyte 5.0 2 - 6.9   Granulocyte percent 60.8 37 - 80 %G   RBC 4.68 4.04 - 5.48 M/uL   Hemoglobin 14.3 12.2 - 16.2 g/dL   HCT, POC 42.3 37.7 - 47.9 %   MCV 90.3 80 - 97 fL   MCH, POC 30.6 27 - 31.2 pg   MCHC 33.9 31.8 - 35.4 g/dL   RDW, POC 12.8 %   Platelet Count, POC 304 142 - 424 K/uL   MPV 7.3 0 - 99.8 fL   EKG/XRAY:     Primary read interpreted by Dr. Everlene Farrier at [facility].   ASSESSMENT/PLAN: Patient has what appears to be a MRSA abscess on the back of her neck with drainage into the right supraclavicular area. Will treat with doxycycline I&D of the abscess site mucus Pearson ointment to the inside of her nose recheck 48 hours.I personally performed the services described in this documentation, which was scribed in my presence. The recorded information has been reviewed  and is accurate.  Nena Jordan, MD  Gross sideeffects, risk and benefits, and alternatives of medications d/w patient. Patient is aware that all medications have potential sideeffects and we are unable to predict every sideeffect or drug-drug interaction that may occur.    Arlyss Queen MD 06/13/2015 1:36 PM

## 2015-06-15 ENCOUNTER — Ambulatory Visit (INDEPENDENT_AMBULATORY_CARE_PROVIDER_SITE_OTHER): Payer: BC Managed Care – PPO | Admitting: Urgent Care

## 2015-06-15 VITALS — BP 102/56 | HR 75 | Temp 99.1°F | Resp 14 | Ht 64.0 in | Wt 160.0 lb

## 2015-06-15 DIAGNOSIS — L0211 Cutaneous abscess of neck: Secondary | ICD-10-CM

## 2015-06-15 NOTE — Progress Notes (Addendum)
Procedure: Consent obtained. Local anesthesia 2% ldiocaine.  Betadine prep.  #11 blade used to make incision - purulence expressed.  Plain packing placed.  Drsg placed.

## 2015-06-15 NOTE — Patient Instructions (Addendum)
-   Change your dressings daily. Keep taking your antibiotics. You may take ibuprofen or Tylenol for pain and inflammation.   Incision and Drainage Incision and drainage is a procedure in which a sac-like structure (cystic structure) is opened and drained. The area to be drained usually contains material such as pus, fluid, or blood.  LET YOUR CAREGIVER KNOW ABOUT:   Allergies to medicine.  Medicines taken, including vitamins, herbs, eyedrops, over-the-counter medicines, and creams.  Use of steroids (by mouth or creams).  Previous problems with anesthetics or numbing medicines.  History of bleeding problems or blood clots.  Previous surgery.  Other health problems, including diabetes and kidney problems.  Possibility of pregnancy, if this applies. RISKS AND COMPLICATIONS  Pain.  Bleeding.  Scarring.  Infection. BEFORE THE PROCEDURE  You may need to have an ultrasound or other imaging tests to see how large or deep your cystic structure is. Blood tests may also be used to determine if you have an infection or how severe the infection is. You may need to have a tetanus shot. PROCEDURE  The affected area is cleaned with a cleaning fluid. The cyst area will then be numbed with a medicine (local anesthetic). A small incision will be made in the cystic structure. A syringe or catheter may be used to drain the contents of the cystic structure, or the contents may be squeezed out. The area will then be flushed with a cleansing solution. After cleansing the area, it is often gently packed with a gauze or another wound dressing. Once it is packed, it will be covered with gauze and tape or some other type of wound dressing. AFTER THE PROCEDURE   Often, you will be allowed to go home right after the procedure.  You may be given antibiotic medicine to prevent or heal an infection.  If the area was packed with gauze or some other wound dressing, you will likely need to come back in 1 to 2  days to get it removed.  The area should heal in about 14 days. Document Released: 04/22/2001 Document Revised: 04/27/2012 Document Reviewed: 12/22/2011 [facility] Patient Information 2015 [place], [place]. This information is not intended to replace advice given to you by your health care provider. Make sure you discuss any questions you have with your health care provider.

## 2015-06-15 NOTE — Progress Notes (Signed)
    MRN: [id number] DOB: 1986-04-27  Subjective:   Brenda Rosales is a 29 y.o. female presenting for follow up on abscess and I&D. Reports significant improvement in pain, swelling. Has started taking doxycycline and mupirocin. Denies fever, drainage of pus, redness , bleeding. Denies any other aggravating or relieving factors, no other questions or concerns.  Brenda Rosales has a current medication list which includes the following prescription(s): albuterol, budesonide-formoterol, doxycycline, fexofenadine, fluticasone, ipratropium, lorazepam, mupirocin ointment, norethindrone-ethinyl estradiol, ondansetron, align, and tacrolimus. She is allergic to vancomycin.  Brenda Rosales  has a past medical history of ASTHMA (11/19/2007); CHICKENPOX (11/19/2007); Dysmenorrhea (11/19/2007); DYSPHAGIA (11/19/2010); ECZEMA (11/19/2007); FATTY LIVER DISEASE (04/17/2008); GERD (11/20/2010); Infection of eyelid (08/07/2011); RHINOSINUSITIS, CHRONIC (12/09/2007); TB SKIN TEST, POSITIVE (11/19/2007); Esophageal stricture; Esophagitis; Duodenitis; Anxiety; Diabetes mellitus without complication; and Other and unspecified noninfectious gastroenteritis and colitis(558.9) (02/2014). Also  has past surgical history that includes Mole excision and ct sinus ltd w/o cm (09/23/07).  ROS As in subjective.  Objective:   Vitals: BP 102/56 mmHg  Pulse 75  Temp(Src) 99.1 F (37.3 C) (Oral)  Resp 14  Ht 5\' 4"  (1.626 m)  Wt 160 lb (72.576 kg)  BMI 27.45 kg/m2  SpO2 98%  LMP 06/13/2015 (Exact Date)  Physical Exam  Constitutional: She is oriented to person, place, and time. She appears well-developed and well-nourished.  Cardiovascular: Normal rate.   Pulmonary/Chest: Effort normal.  Neurological: She is alert and oriented to person, place, and time.  Skin: Skin is warm and dry. No rash noted. There is erythema. No pallor.      WOUND CARE: Abscess s/p I&D Packing removed. Wound cavity was <1cm. Small amount of pus expressed. Wound irrigated  with ~20cc of sterile water. No packing placed. Cleansed and dressed.   Assessment and Plan :   1. Abscess, neck - Significantly improved, continue antibiotic. No dressing applied due to wound cavity being very superficial. RTC in 4 days for re-evaluation.  Jaynee Eagles, PA-C [facility] [phone] 06/15/2015 9:32 AM

## 2015-06-16 LAB — WOUND CULTURE
GRAM STAIN: NONE SEEN
Gram Stain: NONE SEEN

## 2015-06-19 ENCOUNTER — Ambulatory Visit (INDEPENDENT_AMBULATORY_CARE_PROVIDER_SITE_OTHER): Payer: BC Managed Care – PPO | Admitting: Urgent Care

## 2015-06-19 VITALS — BP 101/68 | HR 76 | Temp 98.9°F | Resp 16 | Ht 64.0 in | Wt 161.0 lb

## 2015-06-19 DIAGNOSIS — L0211 Cutaneous abscess of neck: Secondary | ICD-10-CM

## 2015-06-19 NOTE — Progress Notes (Signed)
    MRN: [id number] DOB: Jul 28, 1986  Subjective:   Brenda Rosales is a 29 y.o. female presenting for follow up on abscess and I&D. Reports significant improvement. Denies fever, redness, pain, swelling, drainage pus or bleeding. She is still taking her antibiotic doxycycline. Denies any other aggravating or relieving factors, no other questions or concerns.  Brenda Rosales has a current medication list which includes the following prescription(s): albuterol, budesonide-formoterol, doxycycline, fexofenadine, fluticasone, ipratropium, lorazepam, mupirocin ointment, norethindrone-ethinyl estradiol, ondansetron, align, and tacrolimus. She is allergic to vancomycin.  Brenda Rosales  has a past medical history of ASTHMA (11/19/2007); CHICKENPOX (11/19/2007); Dysmenorrhea (11/19/2007); DYSPHAGIA (11/19/2010); ECZEMA (11/19/2007); FATTY LIVER DISEASE (04/17/2008); GERD (11/20/2010); Infection of eyelid (08/07/2011); RHINOSINUSITIS, CHRONIC (12/09/2007); TB SKIN TEST, POSITIVE (11/19/2007); Esophageal stricture; Esophagitis; Duodenitis; Anxiety; Diabetes mellitus without complication; and Other and unspecified noninfectious gastroenteritis and colitis(558.9) (02/2014). Also  has past surgical history that includes Mole excision and ct sinus ltd w/o cm (09/23/07).  ROS As in subjective.  Objective:   Vitals: BP 101/68 mmHg  Pulse 76  Temp(Src) 98.9 F (37.2 C) (Oral)  Resp 16  Ht 5\' 4"  (1.626 m)  Wt 161 lb (73.029 kg)  BMI 27.62 kg/m2  SpO2 98%  LMP 06/13/2015 (Exact Date)  Physical Exam  Constitutional: She is oriented to person, place, and time. She appears well-developed and well-nourished.  Neck:    Cardiovascular: Normal rate.   Pulmonary/Chest: Effort normal.  Neurological: She is alert and oriented to person, place, and time.  Skin: Skin is warm and dry. No rash noted. No erythema. No pallor.   Assessment and Plan :   1. Abscess, neck - Well-healed, continue antibiotic course to completion. Anticipatory  guidance provided. RTC if symptoms fail to resolve.  Brenda Eagles, PA-C [facility] [phone] 06/19/2015 3:41 PM

## 2015-06-19 NOTE — Patient Instructions (Signed)
Incision and Drainage Incision and drainage is a procedure in which a sac-like structure (cystic structure) is opened and drained. The area to be drained usually contains material such as pus, fluid, or blood.  LET YOUR CAREGIVER KNOW ABOUT:   Allergies to medicine.  Medicines taken, including vitamins, herbs, eyedrops, over-the-counter medicines, and creams.  Use of steroids (by mouth or creams).  Previous problems with anesthetics or numbing medicines.  History of bleeding problems or blood clots.  Previous surgery.  Other health problems, including diabetes and kidney problems.  Possibility of pregnancy, if this applies. RISKS AND COMPLICATIONS  Pain.  Bleeding.  Scarring.  Infection. BEFORE THE PROCEDURE  You may need to have an ultrasound or other imaging tests to see how large or deep your cystic structure is. Blood tests may also be used to determine if you have an infection or how severe the infection is. You may need to have a tetanus shot. PROCEDURE  The affected area is cleaned with a cleaning fluid. The cyst area will then be numbed with a medicine (local anesthetic). A small incision will be made in the cystic structure. A syringe or catheter may be used to drain the contents of the cystic structure, or the contents may be squeezed out. The area will then be flushed with a cleansing solution. After cleansing the area, it is often gently packed with a gauze or another wound dressing. Once it is packed, it will be covered with gauze and tape or some other type of wound dressing. AFTER THE PROCEDURE   Often, you will be allowed to go home right after the procedure.  You may be given antibiotic medicine to prevent or heal an infection.  If the area was packed with gauze or some other wound dressing, you will likely need to come back in 1 to 2 days to get it removed.  The area should heal in about 14 days. Document Released: 04/22/2001 Document Revised: 04/27/2012  Document Reviewed: 12/22/2011 [facility] Patient Information 2015 [place], [place]. This information is not intended to replace advice given to you by your health care provider. Make sure you discuss any questions you have with your health care provider.   Abscess An abscess is an infected area that contains a collection of pus and debris.It can occur in almost any part of the body. An abscess is also known as a furuncle or boil. CAUSES  An abscess occurs when tissue gets infected. This can occur from blockage of oil or sweat glands, infection of hair follicles, or a minor injury to the skin. As the body tries to fight the infection, pus collects in the area and creates pressure under the skin. This pressure causes pain. People with weakened immune systems have difficulty fighting infections and get certain abscesses more often.  SYMPTOMS Usually an abscess develops on the skin and becomes a painful mass that is red, warm, and tender. If the abscess forms under the skin, you may feel a moveable soft area under the skin. Some abscesses break open (rupture) on their own, but most will continue to get worse without care. The infection can spread deeper into the body and eventually into the bloodstream, causing you to feel ill.  DIAGNOSIS  Your caregiver will take your medical history and perform a physical exam. A sample of fluid may also be taken from the abscess to determine what is causing your infection. TREATMENT  Your caregiver may prescribe antibiotic medicines to fight the infection. However, taking antibiotics alone  usually does not cure an abscess. Your caregiver may need to make a small cut (incision) in the abscess to drain the pus. In some cases, gauze is packed into the abscess to reduce pain and to continue draining the area. HOME CARE INSTRUCTIONS   Only take over-the-counter or prescription medicines for pain, discomfort, or fever as directed by your caregiver.  If you were prescribed  antibiotics, take them as directed. Finish them even if you start to feel better.  If gauze is used, follow your caregiver's directions for changing the gauze.  To avoid spreading the infection:  Keep your draining abscess covered with a bandage.  Wash your hands well.  Do not share personal care items, towels, or whirlpools with others.  Avoid skin contact with others.  Keep your skin and clothes clean around the abscess.  Keep all follow-up appointments as directed by your caregiver. SEEK MEDICAL CARE IF:   You have increased pain, swelling, redness, fluid drainage, or bleeding.  You have muscle aches, chills, or a general ill feeling.  You have a fever. MAKE SURE YOU:   Understand these instructions.  Will watch your condition.  Will get help right away if you are not doing well or get worse. Document Released: 08/06/2005 Document Revised: 04/27/2012 Document Reviewed: 01/09/2012 [facility] Patient Information 2015 [place], [place]. This information is not intended to replace advice given to you by your health care provider. Make sure you discuss any questions you have with your health care provider.

## 2015-06-25 NOTE — Addendum Note (Signed)
Addended by: Jaynee Eagles on: 06/25/2015 11:43 AM   Modules accepted: Level of Service

## 2015-06-25 NOTE — Addendum Note (Signed)
Addended by: Jaynee Eagles on: 06/25/2015 11:41 AM   Modules accepted: Level of Service

## 2015-08-19 ENCOUNTER — Encounter (HOSPITAL_COMMUNITY): Payer: Self-pay | Admitting: *Deleted

## 2015-08-19 ENCOUNTER — Emergency Department (HOSPITAL_COMMUNITY)
Admission: EM | Admit: 2015-08-19 | Discharge: 2015-08-19 | Disposition: A | Discharge status: Home / Self Care | Payer: BC Managed Care – PPO | Attending: Emergency Medicine | Admitting: Emergency Medicine

## 2015-08-19 ENCOUNTER — Emergency Department (HOSPITAL_COMMUNITY): Payer: BC Managed Care – PPO

## 2015-08-19 DIAGNOSIS — K219 Gastro-esophageal reflux disease without esophagitis: Secondary | ICD-10-CM | POA: Insufficient documentation

## 2015-08-19 DIAGNOSIS — Z872 Personal history of diseases of the skin and subcutaneous tissue: Secondary | ICD-10-CM | POA: Insufficient documentation

## 2015-08-19 DIAGNOSIS — S81822A Laceration with foreign body, left lower leg, initial encounter: Secondary | ICD-10-CM | POA: Insufficient documentation

## 2015-08-19 DIAGNOSIS — Y9389 Activity, other specified: Secondary | ICD-10-CM | POA: Insufficient documentation

## 2015-08-19 DIAGNOSIS — Y9289 Other specified places as the place of occurrence of the external cause: Secondary | ICD-10-CM | POA: Insufficient documentation

## 2015-08-19 DIAGNOSIS — E119 Type 2 diabetes mellitus without complications: Secondary | ICD-10-CM | POA: Insufficient documentation

## 2015-08-19 DIAGNOSIS — Y998 Other external cause status: Secondary | ICD-10-CM | POA: Diagnosis not present

## 2015-08-19 DIAGNOSIS — IMO0002 Reserved for concepts with insufficient information to code with codable children: Secondary | ICD-10-CM

## 2015-08-19 DIAGNOSIS — J45909 Unspecified asthma, uncomplicated: Secondary | ICD-10-CM | POA: Insufficient documentation

## 2015-08-19 DIAGNOSIS — Z79899 Other long term (current) drug therapy: Secondary | ICD-10-CM | POA: Insufficient documentation

## 2015-08-19 DIAGNOSIS — F419 Anxiety disorder, unspecified: Secondary | ICD-10-CM | POA: Diagnosis not present

## 2015-08-19 DIAGNOSIS — W01198A Fall on same level from slipping, tripping and stumbling with subsequent striking against other object, initial encounter: Secondary | ICD-10-CM | POA: Insufficient documentation

## 2015-08-19 DIAGNOSIS — Z8619 Personal history of other infectious and parasitic diseases: Secondary | ICD-10-CM | POA: Diagnosis not present

## 2015-08-19 LAB — BASIC METABOLIC PANEL
ANION GAP: 8 (ref 5–15)
BUN: 9 mg/dL (ref 6–20)
CALCIUM: 8.9 mg/dL (ref 8.9–10.3)
CO2: 25 mmol/L (ref 22–32)
CREATININE: 0.84 mg/dL (ref 0.44–1.00)
Chloride: 100 mmol/L — ABNORMAL LOW (ref 101–111)
GLUCOSE: 270 mg/dL — AB (ref 65–99)
Potassium: 4.5 mmol/L (ref 3.5–5.1)
Sodium: 133 mmol/L — ABNORMAL LOW (ref 135–145)

## 2015-08-19 LAB — CBC WITH DIFFERENTIAL/PLATELET
BASOS ABS: 0.1 10*3/uL (ref 0.0–0.1)
BASOS PCT: 1 %
EOS ABS: 0.4 10*3/uL (ref 0.0–0.7)
EOS PCT: 4 %
HEMATOCRIT: 39.7 % (ref 36.0–46.0)
Hemoglobin: 13.8 g/dL (ref 12.0–15.0)
Lymphocytes Relative: 31 %
Lymphs Abs: 2.9 10*3/uL (ref 0.7–4.0)
MCH: 31 pg (ref 26.0–34.0)
MCHC: 34.8 g/dL (ref 30.0–36.0)
MCV: 89.2 fL (ref 78.0–100.0)
MONO ABS: 0.5 10*3/uL (ref 0.1–1.0)
MONOS PCT: 6 %
Neutro Abs: 5.4 10*3/uL (ref 1.7–7.7)
Neutrophils Relative %: 58 %
PLATELETS: 251 10*3/uL (ref 150–400)
RBC: 4.45 MIL/uL (ref 3.87–5.11)
RDW: 11.9 % (ref 11.5–15.5)
WBC: 9.3 10*3/uL (ref 4.0–10.5)

## 2015-08-19 MED ORDER — FENTANYL CITRATE (PF) 100 MCG/2ML IJ SOLN: 50.0000 ug | Freq: Once | INTRAMUSCULAR | Status: DC

## 2015-08-19 MED ORDER — LIDOCAINE HCL (PF) 1 % IJ SOLN
30.0000 mL | Freq: Once | INTRAMUSCULAR | Status: AC
  Administered 2015-08-19: 30 mL via INTRADERMAL
  Filled 2015-08-19: qty 30

## 2015-08-19 MED ORDER — HYDROMORPHONE HCL 1 MG/ML IJ SOLN
1.0000 mg | Freq: Once | INTRAMUSCULAR | Status: AC
  Administered 2015-08-19: 1 mg via INTRAVENOUS
  Filled 2015-08-19: qty 1

## 2015-08-19 MED ORDER — CEPHALEXIN 250 MG PO CAPS: 500.0000 mg | ORAL_CAPSULE | Freq: Two times a day (BID) | ORAL | Status: DC

## 2015-08-19 MED ORDER — CEFAZOLIN SODIUM 1-5 GM-% IV SOLN
1.0000 g | Freq: Once | INTRAVENOUS | Status: AC
  Administered 2015-08-19: 1 g via INTRAVENOUS
  Filled 2015-08-19: qty 50

## 2015-08-19 MED ORDER — HYDROCODONE-ACETAMINOPHEN 5-325 MG PO TABS: 1.0000 | ORAL_TABLET | ORAL | Status: DC | PRN

## 2015-08-19 MED ORDER — SODIUM CHLORIDE 0.9 % IV BOLUS (SEPSIS)
500.0000 mL | Freq: Once | INTRAVENOUS | Status: AC
  Administered 2015-08-19: 500 mL via INTRAVENOUS

## 2015-08-19 MED ORDER — TETANUS-DIPHTH-ACELL PERTUSSIS 5-2.5-18.5 LF-MCG/0.5 IM SUSP
0.5000 mL | Freq: Once | INTRAMUSCULAR | Status: DC
  Filled 2015-08-19: qty 0.5

## 2015-08-19 NOTE — ED Notes (Signed)
Pt taken to xray 

## 2015-08-19 NOTE — ED Notes (Signed)
Rock removed from area by [employer]

## 2015-08-19 NOTE — ED Provider Notes (Signed)
CSN: [id number]     Arrival date & time 08/19/15  1946 History   First MD Initiated Contact with Patient 08/19/15 1951     Chief Complaint  Patient presents with  . Leg Injury    Patient is a 29 y.o. female presenting with leg pain. The history is provided by the EMS personnel and the patient.  Leg Pain Location:  Leg Time since incident:  30 minutes Injury: yes   Mechanism of injury: fall   Fall:    Fall occurred:  Tripped   Impact surface: landed with direct impact of left lower leg to pile of rocks. Leg location:  L lower leg Pain details:    Quality:  Sharp   Severity:  Severe   Onset quality:  Sudden Dislocation: no   Foreign body present: large golf ball sized rock embedded in LLE. Tetanus status:  Up to date Prior injury to area:  No Relieved by: fentanyl given by EMS en route. Associated symptoms: swelling   Associated symptoms: no decreased ROM, no fever, no muscle weakness, no numbness and no tingling     Past Medical History  Diagnosis Date  . ASTHMA 11/19/2007  . CHICKENPOX 11/19/2007  . Dysmenorrhea 11/19/2007  . DYSPHAGIA 11/19/2010  . ECZEMA 11/19/2007  . FATTY LIVER DISEASE 04/17/2008  . GERD 11/20/2010  . Infection of eyelid 08/07/2011  . RHINOSINUSITIS, CHRONIC 12/09/2007  . TB SKIN TEST, POSITIVE 11/19/2007  . Esophageal stricture   . Esophagitis   . Duodenitis   . Anxiety   . Diabetes mellitus without complication (HCC)     diet controlled  . Other and unspecified noninfectious gastroenteritis and colitis(558.9) 02/2014   Past Surgical History  Procedure Laterality Date  . Mole excision      x's 4  . Ct sinus ltd w/o cm  09/23/07   Family History  Problem Relation Age of Onset  . Adopted: Yes   Social History  Substance Use Topics  . Smoking status: Never Smoker   . Smokeless tobacco: Never Used  . Alcohol Use: 0.6 oz/week    1 Standard drinks or equivalent per week     Comment: SOCIALLY   OB History    Gravida Para Term Preterm AB TAB SAB  Ectopic Multiple Living   0              Review of Systems  Constitutional: Negative for fever.  HENT: Negative for rhinorrhea.   Eyes: Negative for visual disturbance.  Respiratory: Negative for shortness of breath.   Cardiovascular: Negative for chest pain.  Gastrointestinal: Negative for vomiting and abdominal pain.  Genitourinary: Negative for decreased urine volume.  Musculoskeletal: Positive for arthralgias.  Skin: Positive for wound.  Allergic/Immunologic: Negative for immunocompromised state.  Neurological: Negative for syncope.  Psychiatric/Behavioral: Negative for confusion.    Allergies  Vancomycin  Home Medications   Prior to Admission medications   Medication Sig Start Date End Date Taking? Authorizing Provider  albuterol (PROVENTIL HFA;VENTOLIN HFA) 108 (90 BASE) MCG/ACT inhaler Inhale 2 puffs into the lungs every 6 (six) hours as needed for wheezing or shortness of breath. 02/21/14  Yes Biagio Borg, MD  budesonide-formoterol [facility]) 160-4.5 MCG/ACT inhaler Inhale 2 puffs into the lungs 2 (two) times daily. 02/21/14  Yes Biagio Borg, MD  fexofenadine (ALLEGRA) 180 MG tablet Take 180 mg by mouth daily.    Yes Historical Provider, MD  ipratropium (ATROVENT) 0.03 % nasal spray Place 2 sprays into both nostrils 2 (two) times  daily. 01/22/15  Yes Bennett Scrape V, PA-C  norethindrone-ethinyl estradiol [facility]) 0.4-35 MG-MCG tablet Take 1 tablet by mouth daily. 02/08/15  Yes Regina Eck, CNM  tacrolimus (PROTOPIC) 0.1 % ointment Apply topically 2 (two) times daily. Patient taking differently: Apply 1 application topically 2 (two) times daily as needed (for rash, skin issues).  06/22/14  Yes Roselee Culver, MD  cephALEXin (KEFLEX) 250 MG capsule Take 2 capsules (500 mg total) by mouth 2 (two) times daily. 08/19/15   Ivin Booty, MD  doxycycline (VIBRA-TABS) 100 MG tablet Take 1 tablet (100 mg total) by mouth 2 (two) times daily. 06/13/15   Darlyne Russian, MD   HYDROcodone-acetaminophen (NORCO/VICODIN) 5-325 MG tablet Take 1 tablet by mouth every 4 (four) hours as needed for severe pain. 08/19/15   Ivin Booty, MD  LORazepam (ATIVAN) 1 MG tablet Take 1 tablet (1 mg total) by mouth every 8 (eight) hours as needed for anxiety. 06/22/14   Roselee Culver, MD  mupirocin ointment (BACTROBAN) 2 % Place 1 application into the nose 2 (two) times daily. 06/13/15   Darlyne Russian, MD  ondansetron (ZOFRAN) 4 MG tablet Take 1 tablet (4 mg total) by mouth every 6 (six) hours as needed for nausea or vomiting. 01/30/15   Willia Craze, NP   BP 111/66 mmHg  Pulse 73  Temp(Src) 98 F (36.7 C) (Oral)  Resp 16  SpO2 97%  LMP 08/14/2015 Physical Exam  Constitutional: She is oriented to person, place, and time. She appears well-developed and well-nourished. No distress.  HENT:  Head: Normocephalic and atraumatic.  Eyes: Right eye exhibits no discharge. Left eye exhibits no discharge.  Neck: No tracheal deviation present.  Cardiovascular: Normal rate and regular rhythm.   Pulmonary/Chest: Effort normal and breath sounds normal. No respiratory distress.  Abdominal: Soft. She exhibits no distension. There is no tenderness.  Musculoskeletal:  RLE atraumatic, no ttp over ankle or malleoli, 2+ DP pulse. LLE lower leg with large embedded rock over anterior mid shin, surrounding moderate edema and erythema without clear bony deformity or malalignment. Mild ankle ttp laterally near mal. Sensation to light touch intact distally, 2+ DP pulse   Neurological: She is alert and oriented to person, place, and time.  Skin: Skin is warm and dry.  Psychiatric: She has a normal mood and affect. Her behavior is normal.  Nursing note and vitals reviewed.   ED Course  .Foreign Body Removal Performed by: Ivin Booty Authorized by: Ivin Booty Consent: Verbal consent obtained. Risks and benefits: risks, benefits and alternatives were discussed Consent given by:  patient Patient understanding: patient states understanding of the procedure being performed Patient identity confirmed: verbally with patient, arm band and hospital-assigned identification number Time out: Immediately prior to procedure a "time out" was called to verify the correct patient, procedure, equipment, support staff and site/side marked as required. Body area: skin General location: lower extremity Location details: left lower leg Patient sedated: no Patient restrained: no Patient cooperative: yes Localization method: visualized Removal mechanism: hand. Tendon involvement: none Complexity: simple 1 objects recovered. Objects recovered: large rock Post-procedure assessment: foreign body removed Patient tolerance: Patient tolerated the procedure well with no immediate complications .Marland KitchenLaceration Repair Performed by: Ivin Booty Authorized by: Ivin Booty Consent: Verbal consent obtained. Risks and benefits: risks, benefits and alternatives were discussed Consent given by: patient Patient understanding: patient states understanding of the procedure being performed Patient identity confirmed: verbally with patient, arm band and hospital-assigned identification number Time out: Immediately  prior to procedure a "time out" was called to verify the correct patient, procedure, equipment, support staff and site/side marked as required. Body area: lower extremity Location details: left lower leg Laceration length: 9 cm Contamination: The wound is contaminated. Foreign body present: rock. Tendon involvement: none Nerve involvement: none Vascular damage: no Anesthesia: local infiltration Local anesthetic: lidocaine 1% without epinephrine Preparation: Patient was prepped and draped in the usual sterile fashion. Irrigation solution: saline Irrigation method: syringe Amount of cleaning: extensive Debridement: minimal Degree of undermining: none Skin closure: 4-0  Prolene Number of sutures: 14 Technique: simple Approximation: close Approximation difficulty: simple Dressing: antibiotic ointment, non-adhesive packing strip and pressure dressing Patient tolerance: Patient tolerated the procedure well with no immediate complications   (including critical care time) Labs Review Labs Reviewed  BASIC METABOLIC PANEL - Abnormal; Notable for the following:    Sodium 133 (*)    Chloride 100 (*)    Glucose, Bld 270 (*)    All other components within normal limits  CBC WITH DIFFERENTIAL/PLATELET    Imaging Review Dg Tibia/fibula Left  08/19/2015   CLINICAL DATA:  Fall tonight while walking. Left knee pain and left lower leg pain. Laceration on the lower leg.  EXAM: LEFT TIBIA AND FIBULA - 2 VIEW  COMPARISON:  None.  FINDINGS: Large soft tissue defect over the anterior aspect of the mid left tibia. Suggestion of tiny foreign body within the laceration measuring less than 2 mm. Otherwise, no radiopaque soft tissue foreign bodies are demonstrated. No evidence of acute fracture or dislocation in the left tibia or fibula. No focal bone lesion or bone destruction.  IMPRESSION: Soft tissue injury over the anterior aspect mid left tibia with tiny radiopaque foreign body within the laceration. No acute bony abnormalities.   Electronically Signed   By: Lucienne Capers M.D.   On: 08/19/2015 21:19   Dg Ankle Complete Left  08/19/2015   CLINICAL DATA:  Fall while walking. Left ankle pain. Laceration to the lower extremity. Unable to dorsiflex foot.  EXAM: LEFT ANKLE COMPLETE - 3+ VIEW  COMPARISON:  None.  FINDINGS: The ankle is located. No acute bone or soft tissue abnormality is present. The ankle joint is extended.  IMPRESSION: 1. No acute abnormality. 2. The foot is plantar flexed.   Electronically Signed   By: San Morelle M.D.   On: 08/19/2015 21:16   Dg Knee Complete 4 Views Left  08/19/2015   CLINICAL DATA:  Fall while walking. Left knee pain. Lower leg  laceration. Initial encounter.  EXAM: LEFT KNEE - COMPLETE 4+ VIEW  COMPARISON:  None.  FINDINGS: The superior aspect of the laceration anterior to the tibia is evident on the lateral view. This is better seen on dedicated images of the tibia and fibula.  The knee is unremarkable. No acute bone or soft tissue abnormality is present.  IMPRESSION: 1. Negative left knee radiographs. 2. Soft tissue swelling associated with laceration anterior to the tibia.   Electronically Signed   By: San Morelle M.D.   On: 08/19/2015 21:14   I have personally reviewed and evaluated these images and lab results as part of my medical decision-making.   EKG Interpretation None      MDM   Final diagnoses:  Laceration    29 yo F with hx DM, IBS, anxiety presenting with mechanical fall to LLE, with embedded foreign body and large laceration. No underlying fracture. Neurovascularly intact. Tetanus up to date. Foreign body removed and lac repaired as  above. Nv intact following procedure. Ancef given here, will tx with keflex as outpatient. Counseled on wound care, f/u for wound check and suture removal, and return precautions.   Case discussed with Dr. Angelina Pih who oversaw management of this patient.     Ivin Booty, MD 08/20/15 [id number]  Elnora Morrison, MD 08/22/15 [phone]

## 2015-08-19 NOTE — ED Notes (Addendum)
Pt to ED via [facility] after falling. Pt was out for a walk, twisted right ankle, and came down on the L leg falling onto rocks. Pt presents with golf ball sized rock in L lower leg. CMS intact, pedal pulses present. Pt received 235mcg fentanyl enroute

## 2015-08-19 NOTE — ED Notes (Signed)
MD at bedside, cleaning wound

## 2015-08-29 ENCOUNTER — Ambulatory Visit (INDEPENDENT_AMBULATORY_CARE_PROVIDER_SITE_OTHER): Payer: BC Managed Care – PPO | Admitting: Internal Medicine

## 2015-08-29 ENCOUNTER — Encounter: Payer: Self-pay | Admitting: Internal Medicine

## 2015-08-29 VITALS — BP 98/70 | HR 68 | Temp 98.2°F | Wt 162.0 lb

## 2015-08-29 DIAGNOSIS — S81802D Unspecified open wound, left lower leg, subsequent encounter: Secondary | ICD-10-CM

## 2015-08-29 DIAGNOSIS — S81802A Unspecified open wound, left lower leg, initial encounter: Secondary | ICD-10-CM | POA: Insufficient documentation

## 2015-08-29 NOTE — Progress Notes (Signed)
Subjective:    Patient ID: Brenda Rosales, female    DOB: 1986-02-09, 29 y.o.   MRN: [id number]  HPI  Here to f/u after accidental fall 10 days ago with left anterior mid leg injury soft tissue rather large area related to rock imbedded in the leg, removed at ED, Required 17 stitches to close the wound.  Has no fever, not reqiured antibx, and endorses no worsening pain/red/swelling overwall.  Wants to get back to a kickball game tonight , but willing to wait to Sunday if need be.  Past Medical History  Diagnosis Date  . ASTHMA 11/19/2007  . CHICKENPOX 11/19/2007  . Dysmenorrhea 11/19/2007  . DYSPHAGIA 11/19/2010  . ECZEMA 11/19/2007  . FATTY LIVER DISEASE 04/17/2008  . GERD 11/20/2010  . Infection of eyelid 08/07/2011  . RHINOSINUSITIS, CHRONIC 12/09/2007  . TB SKIN TEST, POSITIVE 11/19/2007  . Esophageal stricture   . Esophagitis   . Duodenitis   . Anxiety   . Diabetes mellitus without complication (HCC)     diet controlled  . Other and unspecified noninfectious gastroenteritis and colitis(558.9) 02/2014   Past Surgical History  Procedure Laterality Date  . Mole excision      x's 4  . Ct sinus ltd w/o cm  09/23/07    reports that she has never smoked. She has never used smokeless tobacco. She reports that she drinks about 0.6 oz of alcohol per week. She reports that she does not use illicit drugs. family history is not on file. She was adopted. Allergies  Allergen Reactions  . Vancomycin Other (See Comments)    redmans syndrome    Current Outpatient Prescriptions on File Prior to Visit  Medication Sig Dispense Refill  . albuterol (PROVENTIL HFA;VENTOLIN HFA) 108 (90 BASE) MCG/ACT inhaler Inhale 2 puffs into the lungs every 6 (six) hours as needed for wheezing or shortness of breath. 1 Inhaler 11  . budesonide-formoterol (SYMBICORT) 160-4.5 MCG/ACT inhaler Inhale 2 puffs into the lungs 2 (two) times daily. 1 Inhaler 11  . cephALEXin (KEFLEX) 250 MG capsule Take 2 capsules (500 mg total)  by mouth 2 (two) times daily. 40 capsule 0  . doxycycline (VIBRA-TABS) 100 MG tablet Take 1 tablet (100 mg total) by mouth 2 (two) times daily. 20 tablet 0  . fexofenadine (ALLEGRA) 180 MG tablet Take 180 mg by mouth daily.     Marland Kitchen HYDROcodone-acetaminophen (NORCO/VICODIN) 5-325 MG tablet Take 1 tablet by mouth every 4 (four) hours as needed for severe pain. 10 tablet 0  . ipratropium (ATROVENT) 0.03 % nasal spray Place 2 sprays into both nostrils 2 (two) times daily. 30 mL 0  . LORazepam (ATIVAN) 1 MG tablet Take 1 tablet (1 mg total) by mouth every 8 (eight) hours as needed for anxiety. 60 tablet 1  . mupirocin ointment (BACTROBAN) 2 % Place 1 application into the nose 2 (two) times daily. 22 g 2  . norethindrone-ethinyl estradiol (BALZIVA) 0.4-35 MG-MCG tablet Take 1 tablet by mouth daily. 1 Package 12  . ondansetron (ZOFRAN) 4 MG tablet Take 1 tablet (4 mg total) by mouth every 6 (six) hours as needed for nausea or vomiting. 20 tablet 0  . tacrolimus (PROTOPIC) 0.1 % ointment Apply topically 2 (two) times daily. (Patient taking differently: Apply 1 application topically 2 (two) times daily as needed (for rash, skin issues). ) 100 g 1  . [DISCONTINUED] metFORMIN (GLUCOPHAGE) 500 MG tablet Take 500 mg by mouth 2 (two) times daily with a meal.  No current facility-administered medications on file prior to visit.   Review of Systems  .All otherwise neg per pt    Objective:   Physical Exam BP 98/70 mmHg  Pulse 68  Temp(Src) 98.2 F (36.8 C) (Oral)  Wt 162 lb (73.483 kg)  SpO2 96%  LMP 08/14/2015 VS noted, not ill appearing Constitutional: Pt appears in no significant distress HENT: Head: NCAT.  Right Ear: External ear normal.  Left Ear: External ear normal.  Eyes: . Pupils are equal, round, and reactive to light. Conjunctivae and EOM are normal Neck: Normal range of motion. Neck supple.  Cardiovascular: Normal rate and regular rhythm.   Pulmonary/Chest: Effort normal and breath  sounds without rales or wheezing.  Neurological: Pt is alert. Not confused , motor grossly intact Skin: Skin is warm. No rash, no LE edema exceot for left mid anterior leg wound site with skin edges intact, mild mid area sweling with tender but no drainage or fluctuance, minimal proximal erythema noted only to wound Psychiatric: Pt behavior is normal. No agitation.     Assessment & Plan:

## 2015-08-29 NOTE — Patient Instructions (Signed)
Your stitches were removed today  Please hold on Kickball until Sunday  Please continue all other medications as before, and refills have been done if requested.  Please have the pharmacy call with any other refills you may need..  Please keep your appointments with your specialists as you may have planned

## 2015-08-29 NOTE — Progress Notes (Signed)
Pre visit review using our clinic review tool, if applicable. No additional management support is needed unless otherwise documented below in the visit note. 

## 2015-08-29 NOTE — Assessment & Plan Note (Addendum)
No s/s infection, wound appears to be healing, stitches removed, but would avoid contact sports for 6 wks, and kickball at least for first 2 wks as well since injury, o/w  to f/u any worsening symptoms or concerns

## 2016-02-20 ENCOUNTER — Encounter: Payer: Self-pay | Admitting: Obstetrics and Gynecology

## 2016-02-20 ENCOUNTER — Ambulatory Visit (INDEPENDENT_AMBULATORY_CARE_PROVIDER_SITE_OTHER): Payer: BC Managed Care – PPO | Admitting: Obstetrics and Gynecology

## 2016-02-20 VITALS — BP 100/64 | HR 80 | Resp 16 | Ht 63.5 in | Wt 161.4 lb

## 2016-02-20 DIAGNOSIS — R739 Hyperglycemia, unspecified: Secondary | ICD-10-CM

## 2016-02-20 DIAGNOSIS — Z3041 Encounter for surveillance of contraceptive pills: Secondary | ICD-10-CM

## 2016-02-20 DIAGNOSIS — Z01419 Encounter for gynecological examination (general) (routine) without abnormal findings: Secondary | ICD-10-CM | POA: Diagnosis not present

## 2016-02-20 DIAGNOSIS — Z124 Encounter for screening for malignant neoplasm of cervix: Secondary | ICD-10-CM | POA: Diagnosis not present

## 2016-02-20 LAB — CBC
HCT: 41.4 % (ref 35.0–45.0)
HEMOGLOBIN: 14.3 g/dL (ref 11.7–15.5)
MCH: 31.6 pg (ref 27.0–33.0)
MCHC: 34.5 g/dL (ref 32.0–36.0)
MCV: 91.6 fL (ref 80.0–100.0)
MPV: 10.7 fL (ref 7.5–12.5)
PLATELETS: 230 10*3/uL (ref 140–400)
RBC: 4.52 MIL/uL (ref 3.80–5.10)
RDW: 12.7 % (ref 11.0–15.0)
WBC: 7.8 10*3/uL (ref 3.8–10.8)

## 2016-02-20 MED ORDER — NORETHINDRONE-ETH ESTRADIOL 0.4-35 MG-MCG PO TABS: 1.0000 | ORAL_TABLET | Freq: Every day | ORAL | Status: DC

## 2016-02-20 NOTE — Patient Instructions (Signed)

## 2016-02-20 NOTE — Progress Notes (Signed)
Patient ID: Brenda Rosales, female   DOB: 1986/01/12, 30 y.o.   MRN: [id number] 30 y.o. G0P0 Single Hispanic female here for annual exam.    Steady partner.  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   Off Metformin. Only took it for one week.  No recent check of hemoglobin A1C.  Wants to continue with OCPs.  PCP:   Cathlean Cower, MD  Patient's last menstrual period was 02/09/2016 (exact date).     Period Cycle (Days): 30 Period Duration (Days): 3-5 Period Pattern: Regular Menstrual Flow:  (heavy first 2 days and then lessens) Menstrual Control: Tampon Menstrual Control Change Freq (Hours): every 4 hrs on heaviest day Dysmenorrhea: (!) Moderate (moderate to severe on 2nd day) Dysmenorrhea Symptoms: Cramping, Diarrhea (diarrhea day before and day of cycle starting)     Sexually active: Yes.   female The current method of family planning is OCP (estrogen/progesterone)--Balziva.    Exercising: Yes.    cardio and weights. Smoker:  no  Health Maintenance: Pap:  02-08-15 Neg History of abnormal Pap:  Yes, abnormal pap 2011 with colposcopy but no treatment to cervix. MMG:  n/a Colonoscopy:  04/2008 for abdominal pain with Dr. Perry:normal. BMD:   n/a  Result  n/a TDaP:  2010 Gardasil:   yes HIV:  declined Hep C:  declined Screening Labs:  Hb today: 14.3, Urine today: unable to void   reports that she has never smoked. She has never used smokeless tobacco. She reports that she drinks about 0.6 oz of alcohol per week. She reports that she does not use illicit drugs.  Past Medical History  Diagnosis Date  . ASTHMA 11/19/2007  . CHICKENPOX 11/19/2007  . Dysmenorrhea 11/19/2007  . DYSPHAGIA 11/19/2010  . ECZEMA 11/19/2007  . FATTY LIVER DISEASE 04/17/2008  . GERD 11/20/2010  . Infection of eyelid 08/07/2011  . RHINOSINUSITIS, CHRONIC 12/09/2007  . TB SKIN TEST, POSITIVE 11/19/2007  . Esophageal stricture   . Esophagitis   . Duodenitis   . Anxiety   . Diabetes mellitus without complication  (HCC)     diet controlled  . Other and unspecified noninfectious gastroenteritis and colitis(558.9) 02/2014    Past Surgical History  Procedure Laterality Date  . Mole excision      x's 4  . Ct sinus ltd w/o cm  09/23/07    Current Outpatient Prescriptions  Medication Sig Dispense Refill  . albuterol (PROVENTIL HFA;VENTOLIN HFA) 108 (90 BASE) MCG/ACT inhaler Inhale 2 puffs into the lungs every 6 (six) hours as needed for wheezing or shortness of breath. 1 Inhaler 11  . EPINEPHrine 0.3 mg/0.3 mL IJ SOAJ injection Inject 0.3 mg into the muscle once.    . fexofenadine (ALLEGRA) 180 MG tablet Take 180 mg by mouth daily.     Marland Kitchen ipratropium (ATROVENT) 0.03 % nasal spray Place 2 sprays into both nostrils 2 (two) times daily. 30 mL 0  . LORazepam (ATIVAN) 1 MG tablet Take 1 tablet (1 mg total) by mouth every 8 (eight) hours as needed for anxiety. 60 tablet 1  . mometasone-formoterol (DULERA) 100-5 MCG/ACT AERO Inhale 2 puffs into the lungs 2 (two) times daily.    . norethindrone-ethinyl estradiol (BALZIVA) 0.4-35 MG-MCG tablet Take 1 tablet by mouth daily. 1 Package 12  . tacrolimus (PROTOPIC) 0.1 % ointment Apply topically 2 (two) times daily. (Patient taking differently: Apply 1 application topically 2 (two) times daily as needed (for rash, skin issues). ) 100 g 1  . [DISCONTINUED] metFORMIN (GLUCOPHAGE) 500  MG tablet Take 500 mg by mouth 2 (two) times daily with a meal.       No current facility-administered medications for this visit.    Family History  Problem Relation Age of Onset  . Adopted: Yes    ROS:  Pertinent items are noted in HPI.  Otherwise, a comprehensive ROS was negative.  Exam:   BP 100/64 mmHg  Pulse 80  Resp 16  Ht 5' 3.5" (1.613 m)  Wt 161 lb 6.4 oz (73.211 kg)  BMI 28.14 kg/m2  LMP 02/09/2016 (Exact Date)    General appearance: alert, cooperative and appears stated age Head: Normocephalic, without obvious abnormality, atraumatic Neck: no adenopathy, supple,  symmetrical, trachea midline and thyroid normal to inspection and palpation Lungs: clear to auscultation bilaterally Breasts: normal appearance, no masses or tenderness, Inspection negative, No nipple retraction or dimpling, No nipple discharge or bleeding, No axillary or supraclavicular adenopathy Heart: regular rate and rhythm Abdomen: incisions:  No.     , soft, non-tender; no masses, no organomegaly Extremities: extremities normal, atraumatic, no cyanosis or edema Skin: Skin color, texture, turgor normal. No rashes or lesions Lymph nodes: Cervical, supraclavicular, and axillary nodes normal. No abnormal inguinal nodes palpated Neurologic: Grossly normal  Pelvic: External genitalia:  no lesions              Urethra:  normal appearing urethra with no masses, tenderness or lesions              Bartholins and Skenes: normal                 Vagina: normal appearing vagina with normal color and discharge, no lesions              Cervix:  3 mm blue area at 12:00.              Pap taken: Yes.   Bimanual Exam:  Uterus:  normal size, contour, position, consistency, mobility, non-tender              Adnexa: normal adnexa and no mass, fullness, tenderness              Rectal exam: No..    Chaperone was present for exam.  Assessment:   Well woman visit with normal exam. Hx diabetes mellitus.   Plan: Yearly mammogram recommended after age 77.  Recommended self breast exam.  Pap and HR HPV as above. Discussed Calcium, Vitamin D, regular exercise program including cardiovascular and weight bearing exercise. Labs performed.  Yes.  .   See orders.  Routine labs and hemoglobin A1C. Declines STD check.  Prescription medication(s) given.  Yes.  .  See orders.  Refill of OCPs for one year.   Follow up annually and prn.    After visit summary provided.

## 2016-02-21 ENCOUNTER — Encounter: Payer: Self-pay | Admitting: Obstetrics and Gynecology

## 2016-02-21 ENCOUNTER — Telehealth: Payer: Self-pay

## 2016-02-21 LAB — COMPREHENSIVE METABOLIC PANEL
ALBUMIN: 4.1 g/dL (ref 3.6–5.1)
ALT: 61 U/L — ABNORMAL HIGH (ref 6–29)
AST: 38 U/L — AB (ref 10–30)
Alkaline Phosphatase: 61 U/L (ref 33–115)
BILIRUBIN TOTAL: 0.6 mg/dL (ref 0.2–1.2)
BUN: 10 mg/dL (ref 7–25)
CHLORIDE: 102 mmol/L (ref 98–110)
CO2: 22 mmol/L (ref 20–31)
Calcium: 9.1 mg/dL (ref 8.6–10.2)
Creat: 0.77 mg/dL (ref 0.50–1.10)
Glucose, Bld: 277 mg/dL — ABNORMAL HIGH (ref 65–99)
Potassium: 4 mmol/L (ref 3.5–5.3)
SODIUM: 135 mmol/L (ref 135–146)
TOTAL PROTEIN: 6.9 g/dL (ref 6.1–8.1)

## 2016-02-21 LAB — LIPID PANEL
Cholesterol: 249 mg/dL — ABNORMAL HIGH (ref 125–200)
HDL: 36 mg/dL — ABNORMAL LOW (ref >=46)
TRIGLYCERIDES: 511 mg/dL — AB (ref <150)
Total CHOL/HDL Ratio: 6.9 Ratio — ABNORMAL HIGH (ref <=5.0)

## 2016-02-21 LAB — HEMOGLOBIN A1C
Hgb A1c MFr Bld: 8.7 % — ABNORMAL HIGH (ref <5.7)
MEAN PLASMA GLUCOSE: 203 mg/dL

## 2016-02-21 NOTE — Telephone Encounter (Signed)
Spoke with patient. Advised of results as seen below from Justin. She is agreeable and verbalizes understanding. States that she has only seen her PCP Dr.John once as he took over for her other physician. She is requesting a referral to a new PCP. Advised I will place a referral and return call with an appointment date and time. She is agreeable.   Spoke with Diane at [place] [facility]. Latest new patient appointment available is at 2 pm during the week. Patient requested an appointment after 3 pm due to her work schedule. Diane will contact the patient directly to schedule a New patient appointment with their office.  Routing to provider for final review. Patient agreeable to disposition. Will close encounter.

## 2016-02-21 NOTE — Telephone Encounter (Signed)
-----   Message from Nunzio Cobbs, MD sent at 02/21/2016  9:32 AM EDT ----- Please contact patient with her lab results from yesterday.  (I did send her a brief message by My Chart with results and that Triage would call.)  Her hemoglobin A1C indicates diabetes.   The level is 8.7.  Anything above 6.5 is consistent with diabetes. In the past the level was 6.7 four years ago.   Her cholesterol and triglycerides are elevated.  This can be lowered through diet and exercise, and cholesterol lowering medication.   Liver enzymes are elevated as well.    The blood counts are normal.   I am recommending follow up with her PCP.  Please facilitate to make an appointment for her.   Cc- Marisa Sprinkles

## 2016-02-25 LAB — HEMOGLOBIN, FINGERSTICK: HEMOGLOBIN, FINGERSTICK: 14.3 g/dL (ref 12.0–16.0)

## 2016-02-25 LAB — IPS PAP TEST WITH HPV

## 2016-04-23 ENCOUNTER — Telehealth: Payer: Self-pay | Admitting: Family Medicine

## 2016-04-23 ENCOUNTER — Encounter: Payer: Self-pay | Admitting: Family Medicine

## 2016-04-23 ENCOUNTER — Ambulatory Visit (INDEPENDENT_AMBULATORY_CARE_PROVIDER_SITE_OTHER): Payer: BC Managed Care – PPO | Admitting: Family Medicine

## 2016-04-23 VITALS — BP 102/69 | HR 72 | Temp 98.5°F | Resp 20 | Ht 64.0 in | Wt 157.5 lb

## 2016-04-23 DIAGNOSIS — E781 Pure hyperglyceridemia: Secondary | ICD-10-CM | POA: Diagnosis not present

## 2016-04-23 DIAGNOSIS — E1165 Type 2 diabetes mellitus with hyperglycemia: Secondary | ICD-10-CM

## 2016-04-23 DIAGNOSIS — IMO0001 Reserved for inherently not codable concepts without codable children: Secondary | ICD-10-CM

## 2016-04-23 DIAGNOSIS — J45998 Other asthma: Secondary | ICD-10-CM

## 2016-04-23 LAB — MICROALBUMIN / CREATININE URINE RATIO
Creatinine,U: 53.7 mg/dL
Microalb Creat Ratio: 1.3 mg/g (ref 0.0–30.0)

## 2016-04-23 MED ORDER — METFORMIN HCL 500 MG PO TABS: ORAL_TABLET | ORAL | Status: DC

## 2016-04-23 NOTE — Progress Notes (Addendum)
Patient ID: Brenda Rosales, female   DOB: [birth year], 30 y.o.   MRN: [id number]      Patient ID: Brenda Rosales, female  DOB: Sep 29, 1986, 30 y.o.   MRN: [id number]  Subjective:  Brenda Rosales is a 30 y.o. female present for transfer of care with abnormal labs at GYN office. We reviewed all labs today in detail. Briefly, elevated triglycerides and uncontrolled diabetes (not on medications by choice). All past medical history, surgical history, allergies, family history, immunizations, medications and social history were obtained/updated in the electronic medical record today. All recent labs, ED visits and hospitalizations within the last year were reviewed. Patient Care Team    Relationship Specialty Notifications Start End  Ma Hillock, DO PCP - General Family Medicine  04/23/16    Comment: Patient request  Nunzio Cobbs, MD Consulting Physician Obstetrics and Gynecology  04/23/16   Deneise Lever, MD Consulting Physician Pulmonary Disease  04/23/16   Irene Shipper, MD Consulting Physician Gastroenterology  04/23/16     Health maintenance: Pt adopted, no FHX available.  Colonoscopy: screen at 42 Mammogram: screen at 61, has GYN Cervical cancer screening: routinely follows with Dr. Quincy Simmonds (GYN) Immunizations: UTD tdap and influenza, will need PNA series started with DM.  Infectious disease screening: Completed 2014 DEXA:Routine screen  Recent Results (from the past 2160 hour(s))  Hemoglobin, fingerstick     Status: None   Collection Time: 02/20/16  4:39 PM  Result Value Ref Range   Hemoglobin, fingerstick 14.3 12.0 - 16.0 g/dL  Hemoglobin A1c     Status: Abnormal   Collection Time: 02/20/16  4:39 PM  Result Value Ref Range   Hgb A1c MFr Bld 8.7 (H) <5.7 %    Comment:   For someone without known diabetes, a hemoglobin A1c value of 6.5% or greater indicates that they may have diabetes and this should be confirmed with a follow-up test.   For someone with known  diabetes, a value <7% indicates that their diabetes is well controlled and a value greater than or equal to 7% indicates suboptimal control. A1c targets should be individualized based on duration of diabetes, age, comorbid conditions, and other considerations.   Currently, no consensus exists for use of hemoglobin A1c for diagnosis of diabetes for children.      Mean Plasma Glucose 203 mg/dL  Comprehensive metabolic panel     Status: Abnormal   Collection Time: 02/20/16  4:39 PM  Result Value Ref Range   Sodium 135 135 - 146 mmol/L   Potassium 4.0 3.5 - 5.3 mmol/L   Chloride 102 98 - 110 mmol/L   CO2 22 20 - 31 mmol/L   Glucose, Bld 277 (H) 65 - 99 mg/dL   BUN 10 7 - 25 mg/dL   Creat 0.77 0.50 - 1.10 mg/dL   Total Bilirubin 0.6 0.2 - 1.2 mg/dL   Alkaline Phosphatase 61 33 - 115 U/L   AST 38 (H) 10 - 30 U/L   ALT 61 (H) 6 - 29 U/L   Total Protein 6.9 6.1 - 8.1 g/dL   Albumin 4.1 3.6 - 5.1 g/dL   Calcium 9.1 8.6 - 10.2 mg/dL  Lipid panel     Status: Abnormal   Collection Time: 02/20/16  4:39 PM  Result Value Ref Range   Cholesterol 249 (H) 125 - 200 mg/dL   Triglycerides 511 (H) <150 mg/dL   HDL 36 (L) >=46 mg/dL   Total CHOL/HDL Ratio 6.9 (H) <=5.0 Ratio  VLDL NOT CALC <30 mg/dL    Comment:   Not calculated due to Triglyceride >400. Suggest ordering Direct LDL (Unit Code: [phone]).    LDL Cholesterol NOT CALC <130 mg/dL    Comment:   Not calculated due to Triglyceride >400. Suggest ordering Direct LDL (Unit Code: [phone]).   Total Cholesterol/HDL Ratio:CHD Risk                        Coronary Heart Disease Risk Table                                        Men       Women          1/2 Average Risk              3.4        3.3              Average Risk              5.0        4.4           2X Average Risk              9.6        7.1           3X Average Risk             23.4       11.0 Use the calculated Patient Ratio above and the CHD Risk table  to determine the  patient's CHD Risk.   CBC     Status: None   Collection Time: 02/20/16  4:39 PM  Result Value Ref Range   WBC 7.8 3.8 - 10.8 K/uL   RBC 4.52 3.80 - 5.10 MIL/uL   Hemoglobin 14.3 11.7 - 15.5 g/dL   HCT 41.4 35.0 - 45.0 %   MCV 91.6 80.0 - 100.0 fL   MCH 31.6 27.0 - 33.0 pg   MCHC 34.5 32.0 - 36.0 g/dL   RDW 12.7 11.0 - 15.0 %   Platelets 230 140 - 400 K/uL   MPV 10.7 7.5 - 12.5 fL    Comment: ** Please note change in unit of measure and reference range(s). **  Pap Test with HP (IPS)     Status: None   Collection Time: 02/21/16  3:35 PM  Result Value Ref Range   COMMENTS: [facility]     Comment: [place], [place] [address] [place] [address] GYN CYTOLOGY REPORT  PATIENT NAME:Hutzler, NYAMAL Rosales PATHOLOGY#:[id number]: F DOB: 1986-09-05 (Age: 16) MEDICAL RECORD GR:[id number] DOCTOR:Brook Quincy Simmonds, MD DATE OBTAINED:4/13/2017CLIENT:[facility] DATE RECEIVED:4/14/2017OTHER PHYS: DATE SIGNED:02/25/2016 PAP Thinlayer with HPV Final Cytologic Interpretation:       Cervical, ThinLayer with Automated Imaging and Dual Review, CPT 88175      Negative for Intraepithelial Lesions or Malignancy.       ADEQUACY OF SPECIMEN:           Satisfactory for evaluation. Endocervical cells/transformation zone component identified.             NOTE: This Pap test has been evaluated with computer assisted technology.       Electronically signed by: CMT, CT(ASCP), [address], [place], [place], (Med. Dir.: Sandrea Hughs, MD) cmt/4/17/2017The Pap test is a  screening mechanism with excellent but not perfect ability to prevent cervica l carcinoma.  It has a low, but  significant, diagnostic error rate. The pap test is suboptimal  for detection of glandular lesions.  It should be noted that a negative result does not definitively rule out the presence of disease.Ref: DeMay, RM, The Art and Science of Cytopathology,  [employer],  [phone]. HPV Results   High Risk HPV -    Not Detected  Reference Range = Not Detected A result of "Detected" signifies the presence of one or more high risk types of HPV.  The APTIMA HPV Assay is an in vitro nucleic acid amplification test for the qualitative detection of E6/E7 viral messenger RNA (mRNA) from 14 high-risk types of  human papillomavirus (HPV) in cervical specimens. The high-risk HPV types detected by the assay include: 16, 18, 31, 33, 35, 39, 45, 51, 52, 56, 58, 59, 66, and 68. APTIMA HPV method will be performed on the [employer].  The APTIMA HPV Assay is designed to enhance existing methods for the detection of cervical disease and should be use d in conjunction with clinical information derived from other diagnostic and screening tests, physical examinations, and full medical  history in accordance with appropriate patient management procedures. The APTIMA HPV Assay on ThinPrep(tm) PreservCyt(tm) specimens is FDA approved on the [employer].The APTIMA HPV Assay on SurePath(tm) specimens was developed and its performance characteristics determined by [facility].   It has not been cleared or approved by the U.S. Food and Drug Administration.  The FDA has determined that such clearance or approval is not necessary.  This test is used for clinical purposes.  This laboratory is certified under the [place] (CLIA) as qualified to perform high complexity clinical laboratory testing. Electronically signed by: Bethann Goo, [place](ASCP) [address], [place], [place] (Med. Dir.: Sandrea Hughs) Last Menstrual Period: 02/09/2016  Other Clinical Conditions: Previous  Abnormal Pap - per requisition Technical processing performed at [facility], [address] [place], [place] [address], CLIA# [id number].      Immunization History  Administered Date(s) Administered  . Influenza Whole  08/19/2009  . Influenza-Unspecified 08/11/2015  . Pneumococcal Polysaccharide-23 07/23/2007  . Td 09/11/2004  . Tdap 07/04/2012     Past Medical History  Diagnosis Date  . ASTHMA 11/19/2007  . CHICKENPOX 11/19/2007  . Dysmenorrhea 11/19/2007  . DYSPHAGIA 11/19/2010  . ECZEMA 11/19/2007  . FATTY LIVER DISEASE 04/17/2008  . GERD 11/20/2010  . Infection of eyelid 08/07/2011  . RHINOSINUSITIS, CHRONIC 12/09/2007  . TB SKIN TEST, POSITIVE 11/19/2007  . Esophageal stricture   . Esophagitis   . Duodenitis   . Anxiety   . Diabetes mellitus without complication (HCC)     diet controlled  . Other and unspecified noninfectious gastroenteritis and colitis(558.9) 02/2014  . Hyperlipidemia   . Allergy   . IBS (irritable bowel syndrome)    Allergies  Allergen Reactions  . Vancomycin Other (See Comments)    redmans syndrome    Past Surgical History  Procedure Laterality Date  . Mole excision      x's 4  . Ct sinus ltd w/o cm  09/23/07   Family History  Problem Relation Age of Onset  . Adopted: Yes   Social History   Social History  . Marital Status: Single    Spouse Name: N/A  . Number of Children: 0  . Years of Education: N/A  Occupational History  . Teacher    Social History Main Topics  . Smoking status: Never Smoker   . Smokeless tobacco: Never Used  . Alcohol Use: 0.6 oz/week    1 Standard drinks or equivalent per week     Comment: SOCIALLY  . Drug Use: No  . Sexual Activity:    Partners: Male    Birth Control/ Protection: Pill     Comment: Hulda Humphrey   Other Topics Concern  . Not on file   Social History Narrative   Adopted from [place].    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   Drinks caffeine, uses herbal remedies   Wears her seatbelt, wears bicycle helmet   Reports routine exercise    Smoke detector in the home.   Feels safe in relationships.       ROS: Negative, with the exception of above mentioned in  HPI  Objective: BP 102/69 mmHg  Pulse 72  Temp(Src) 98.5 F (36.9 C) (Oral)  Resp 20  Ht 5\' 4"  (1.626 m)  Wt 157 lb 8 oz (71.442 kg)  BMI 27.02 kg/m2  SpO2 97%  LMP 04/07/2016 Gen: Afebrile. No acute distress. Nontoxic in appearance, well-developed, well-nourished, female HENT: AT. [place]. MMM, no oral lesions Eyes:Pupils Equal Round Reactive to light, Extraocular movements intact,  Conjunctiva without redness, discharge or icterus. Neck/lymp/endocrine: Supple, no lymphadenopathy, no thyromegaly CV: RRR, no edema, +2/4 P posterior tibialis pulses. Chest: CTAB, no wheeze, rhonchi or crackles. Normal Respiratory effort. good Air movement. Abd: Soft. round. NTND. BS present.  Skin: Warm and well-perfused. Skin intact. Neuro/Msk:Normal gait. PERLA. EOMi. Alert. Oriented x3.   Psych: Apathetic   Assessment/plan: Makalah Saffo is a 30 y.o. female present for establishment of care.  Uncontrolled type 2 diabetes mellitus without complication, without long-term current use of insulin (HCC) Hypertriglyceridemia - A1c 8.7 and triglycerides > 500 at GYN appt/labs.  - Pt is not very motivated or maybe does not truly believe she is a "diabetic". Difficult to tell. Spent a great deal of time with her showing her the lab results and explaining the degree and risks at her level of a1c and triglycerides. She did not show great interest and declined medication for cholesterol. She did not outwardly decline start of metformin, but did voice that she did not feel she needed medication. She has been diagnosed with an a1c 11% in 2012, and never took medications. She was agreeable to fish oil supplement and nutrition/diabetes education  referral today. She does not desire to check her glucose levels.   - Discussed vaccinations, eye and foot care in diabetes with patient, again hard sell to someone that does not believe anything is truly wrong. We will slowly reinforce and complete, If she returns for  follow-ups. Hopefully if she attends diabetes education/nutirition this will help reinforce as well.  - encourage her diet and exercise, in some people, can allow them to come off diabetes medications. This does not necessarily mean a lifetime of medications,but certainly means a lifetime of monitoring close. - Pt was allowed many opportunities to ask questions.  - AVS on diabetes diet and lowering triglycerides.  - Encouraged fish oil of 3000 mg daily, since she is declining prescribed medication for cholesterol.  - metFORMIN (GLUCOPHAGE) 500 MG tablet; 500 mg BID for 1 week, then 1000 mg BID  Dispense: 120 tablet; Refill: 3 - Ambulatory referral to diabetic education - Urine Microalbumin w/creat. Ratio - F/U 3 months  Return in about 3 months (around 07/24/2016) for Diabetes.  Greater than 40 minutes was spent with patient, greater than 50% of that time was spent face-to-face with patient counseling and coordinating care.  Electronically signed by: Howard Pouch, DO [place]

## 2016-04-23 NOTE — Telephone Encounter (Signed)
Please call pt: - Urine study was normal.

## 2016-04-23 NOTE — Patient Instructions (Signed)
Diabetes Mellitus and Food It is important for you to manage your blood sugar (glucose) level. Your blood glucose level can be greatly affected by what you eat. Eating healthier foods in the appropriate amounts throughout the day at about the same time each day will help you control your blood glucose level. It can also help slow or prevent worsening of your diabetes mellitus. Healthy eating may even help you improve the level of your blood pressure and reach or maintain a healthy weight.  General recommendations for healthful eating and cooking habits include:  Eating meals and snacks regularly. Avoid going long periods of time without eating to lose weight.  Eating a diet that consists mainly of plant-based foods, such as fruits, vegetables, nuts, legumes, and whole grains.  Using low-heat cooking methods, such as baking, instead of high-heat cooking methods, such as deep frying. Work with your dietitian to make sure you understand how to use the Nutrition Facts information on food labels. HOW CAN FOOD AFFECT ME? Carbohydrates Carbohydrates affect your blood glucose level more than any other type of food. Your dietitian will help you determine how many carbohydrates to eat at each meal and teach you how to count carbohydrates. Counting carbohydrates is important to keep your blood glucose at a healthy level, especially if you are using insulin or taking certain medicines for diabetes mellitus. Alcohol Alcohol can cause sudden decreases in blood glucose (hypoglycemia), especially if you use insulin or take certain medicines for diabetes mellitus. Hypoglycemia can be a life-threatening condition. Symptoms of hypoglycemia (sleepiness, dizziness, and disorientation) are similar to symptoms of having too much alcohol.  If your health care provider has given you approval to drink alcohol, do so in moderation and use the following guidelines:  Women should not have more than one drink per day, and men  should not have more than two drinks per day. One drink is equal to:  12 oz of beer.  5 oz of wine.  1 oz of hard liquor.  Do not drink on an empty stomach.  Keep yourself hydrated. Have water, diet soda, or unsweetened iced tea.  Regular soda, juice, and other mixers might contain a lot of carbohydrates and should be counted. WHAT FOODS ARE NOT RECOMMENDED? As you make food choices, it is important to remember that all foods are not the same. Some foods have fewer nutrients per serving than other foods, even though they might have the same number of calories or carbohydrates. It is difficult to get your body what it needs when you eat foods with fewer nutrients. Examples of foods that you should avoid that are high in calories and carbohydrates but low in nutrients include:  Trans fats (most processed foods list trans fats on the Nutrition Facts label).  Regular soda.  Juice.  Candy.  Sweets, such as cake, pie, doughnuts, and cookies.  Fried foods. WHAT FOODS CAN I EAT? Eat nutrient-rich foods, which will nourish your body and keep you healthy. The food you should eat also will depend on several factors, including:  The calories you need.  The medicines you take.  Your weight.  Your blood glucose level.  Your blood pressure level.  Your cholesterol level. You should eat a variety of foods, including:  Protein.  Lean cuts of meat.  Proteins low in saturated fats, such as fish, egg whites, and beans. Avoid processed meats.  Fruits and vegetables.  Fruits and vegetables that may help control blood glucose levels, such as apples, mangoes, and   yams.  Dairy products.  Choose fat-free or low-fat dairy products, such as milk, yogurt, and cheese.  Grains, bread, pasta, and rice.  Choose whole grain products, such as multigrain bread, whole oats, and brown rice. These foods may help control blood pressure.  Fats.  Foods containing healthful fats, such as nuts,  avocado, olive oil, canola oil, and fish. DOES EVERYONE WITH DIABETES MELLITUS HAVE THE SAME MEAL PLAN? Because every person with diabetes mellitus is different, there is not one meal plan that works for everyone. It is very important that you meet with a dietitian who will help you create a meal plan that is just right for you.   This information is not intended to replace advice given to you by your health care provider. Make sure you discuss any questions you have with your health care provider.   Document Released: 07/24/2005 Document Revised: 11/17/2014 Document Reviewed: 09/23/2013 Elsevier Interactive Patient Education 2016 [place] Choices to Lower Your Triglycerides Triglycerides are a type of fat in your blood. High levels of triglycerides can increase the risk of heart disease and stroke. If your triglyceride levels are high, the foods you eat and your eating habits are very important. Choosing the right foods can help lower your triglycerides.  WHAT GENERAL GUIDELINES DO I NEED TO FOLLOW?  Lose weight if you are overweight.   Limit or avoid alcohol.   Fill one half of your plate with vegetables and green salads.   Limit fruit to two servings a day. Choose fruit instead of juice.   Make one fourth of your plate whole grains. Look for the word "whole" as the first word in the ingredient list.  Fill one fourth of your plate with lean protein foods.  Enjoy fatty fish (such as salmon, mackerel, sardines, and tuna) three times a week.   Choose healthy fats.   Limit foods high in starch and sugar.  Eat more home-cooked food and less restaurant, buffet, and fast food.  Limit fried foods.  Cook foods using methods other than frying.  Limit saturated fats.  Check ingredient lists to avoid foods with partially hydrogenated oils (trans fats) in them. WHAT FOODS CAN I EAT?  Grains Whole grains, such as whole wheat or whole grain breads, crackers, cereals,  and pasta. Unsweetened oatmeal, bulgur, barley, quinoa, or brown rice. Corn or whole wheat flour tortillas.  Vegetables Fresh or frozen vegetables (raw, steamed, roasted, or grilled). Green salads. Fruits All fresh, canned (in natural juice), or frozen fruits. Meat and Other Protein Products Ground beef (85% or leaner), grass-fed beef, or beef trimmed of fat. Skinless chicken or [place]. Ground chicken or [place]. Pork trimmed of fat. All fish and seafood. Eggs. Dried beans, peas, or lentils. Unsalted nuts or seeds. Unsalted canned or dry beans. Dairy Low-fat dairy products, such as skim or 1% milk, 2% or reduced-fat cheeses, low-fat ricotta or cottage cheese, or plain low-fat yogurt. Fats and Oils Tub margarines without trans fats. Light or reduced-fat mayonnaise and salad dressings. Avocado. Safflower, olive, or canola oils. Natural peanut or almond butter. The items listed above may not be a complete list of recommended foods or beverages. Contact your dietitian for more options. WHAT FOODS ARE NOT RECOMMENDED?  Grains White bread. White pasta. White rice. Cornbread. Bagels, pastries, and croissants. Crackers that contain trans fat. Vegetables White potatoes. Corn. Creamed or fried vegetables. Vegetables in a cheese sauce. Fruits Dried fruits. Canned fruit in light or heavy syrup. Fruit juice. Meat and  Other Protein Products Fatty cuts of meat. Ribs, chicken wings, bacon, sausage, bologna, salami, chitterlings, fatback, hot dogs, bratwurst, and packaged luncheon meats. Dairy Whole or 2% milk, cream, half-and-half, and cream cheese. Whole-fat or sweetened yogurt. Full-fat cheeses. Nondairy creamers and whipped toppings. Processed cheese, cheese spreads, or cheese curds. Sweets and Desserts Corn syrup, sugars, honey, and molasses. Candy. Jam and jelly. Syrup. Sweetened cereals. Cookies, pies, cakes, donuts, muffins, and ice cream. Fats and Oils Butter, stick margarine, lard, shortening,  ghee, or bacon fat. Coconut, palm kernel, or palm oils. Beverages Alcohol. Sweetened drinks (such as sodas, lemonade, and fruit drinks or punches). The items listed above may not be a complete list of foods and beverages to avoid. Contact your dietitian for more information.   This information is not intended to replace advice given to you by your health care provider. Make sure you discuss any questions you have with your health care provider.   Document Released: 08/14/2004 Document Revised: 11/17/2014 Document Reviewed: 08/31/2013 Elsevier Interactive Patient Education 2016 [place] fish oil supplement 3000 mg daily.  Start metformin taper, we will need to check your a1c in 3 months.  Nutrition referral for diabetes/food choices.

## 2016-04-24 NOTE — Telephone Encounter (Signed)
Left message for patient to return call.

## 2016-04-28 NOTE — Telephone Encounter (Signed)
Left message with lab results on patient voice mail 

## 2016-05-27 ENCOUNTER — Ambulatory Visit: Payer: BC Managed Care – PPO

## 2016-06-03 ENCOUNTER — Ambulatory Visit: Payer: BC Managed Care – PPO

## 2016-06-10 ENCOUNTER — Ambulatory Visit: Payer: BC Managed Care – PPO

## 2016-07-24 ENCOUNTER — Ambulatory Visit: Payer: BC Managed Care – PPO | Admitting: Family Medicine

## 2016-07-31 ENCOUNTER — Encounter: Payer: Self-pay | Admitting: Family Medicine

## 2016-07-31 ENCOUNTER — Ambulatory Visit (INDEPENDENT_AMBULATORY_CARE_PROVIDER_SITE_OTHER): Payer: BC Managed Care – PPO | Admitting: Family Medicine

## 2016-07-31 VITALS — BP 116/76 | HR 74 | Temp 98.4°F | Resp 20 | Ht 64.0 in | Wt 150.0 lb

## 2016-07-31 DIAGNOSIS — E1165 Type 2 diabetes mellitus with hyperglycemia: Secondary | ICD-10-CM | POA: Diagnosis not present

## 2016-07-31 DIAGNOSIS — IMO0001 Reserved for inherently not codable concepts without codable children: Secondary | ICD-10-CM

## 2016-07-31 DIAGNOSIS — Z23 Encounter for immunization: Secondary | ICD-10-CM

## 2016-07-31 LAB — POCT GLYCOSYLATED HEMOGLOBIN (HGB A1C): Hemoglobin A1C: 8.6

## 2016-07-31 MED ORDER — METFORMIN HCL 500 MG PO TABS: ORAL_TABLET | ORAL | 3 refills | Status: DC

## 2016-07-31 NOTE — Patient Instructions (Signed)
It was nice to see you again. Start the metformin taper with meals.  Continue the exercise and diet modifications. Increasing exercise to > 150 minutes a week.  Follow up in 3 months   Diabetes Mellitus and Food It is important for you to manage your blood sugar (glucose) level. Your blood glucose level can be greatly affected by what you eat. Eating healthier foods in the appropriate amounts throughout the day at about the same time each day will help you control your blood glucose level. It can also help slow or prevent worsening of your diabetes mellitus. Healthy eating may even help you improve the level of your blood pressure and reach or maintain a healthy weight.  General recommendations for healthful eating and cooking habits include:  Eating meals and snacks regularly. Avoid going long periods of time without eating to lose weight.  Eating a diet that consists mainly of plant-based foods, such as fruits, vegetables, nuts, legumes, and whole grains.  Using low-heat cooking methods, such as baking, instead of high-heat cooking methods, such as deep frying. Work with your dietitian to make sure you understand how to use the Nutrition Facts information on food labels. HOW CAN FOOD AFFECT ME? Carbohydrates Carbohydrates affect your blood glucose level more than any other type of food. Your dietitian will help you determine how many carbohydrates to eat at each meal and teach you how to count carbohydrates. Counting carbohydrates is important to keep your blood glucose at a healthy level, especially if you are using insulin or taking certain medicines for diabetes mellitus. Alcohol Alcohol can cause sudden decreases in blood glucose (hypoglycemia), especially if you use insulin or take certain medicines for diabetes mellitus. Hypoglycemia can be a life-threatening condition. Symptoms of hypoglycemia (sleepiness, dizziness, and disorientation) are similar to symptoms of having too much alcohol.   If your health care provider has given you approval to drink alcohol, do so in moderation and use the following guidelines:  Women should not have more than one drink per day, and men should not have more than two drinks per day. One drink is equal to:  12 oz of beer.  5 oz of wine.  1 oz of hard liquor.  Do not drink on an empty stomach.  Keep yourself hydrated. Have water, diet soda, or unsweetened iced tea.  Regular soda, juice, and other mixers might contain a lot of carbohydrates and should be counted. WHAT FOODS ARE NOT RECOMMENDED? As you make food choices, it is important to remember that all foods are not the same. Some foods have fewer nutrients per serving than other foods, even though they might have the same number of calories or carbohydrates. It is difficult to get your body what it needs when you eat foods with fewer nutrients. Examples of foods that you should avoid that are high in calories and carbohydrates but low in nutrients include:  Trans fats (most processed foods list trans fats on the Nutrition Facts label).  Regular soda.  Juice.  Candy.  Sweets, such as cake, pie, doughnuts, and cookies.  Fried foods. WHAT FOODS CAN I EAT? Eat nutrient-rich foods, which will nourish your body and keep you healthy. The food you should eat also will depend on several factors, including:  The calories you need.  The medicines you take.  Your weight.  Your blood glucose level.  Your blood pressure level.  Your cholesterol level. You should eat a variety of foods, including:  Protein.  Lean cuts of meat.  Proteins low in saturated fats, such as fish, egg whites, and beans. Avoid processed meats.  Fruits and vegetables.  Fruits and vegetables that may help control blood glucose levels, such as apples, mangoes, and yams.  Dairy products.  Choose fat-free or low-fat dairy products, such as milk, yogurt, and cheese.  Grains, bread, pasta, and  rice.  Choose whole grain products, such as multigrain bread, whole oats, and brown rice. These foods may help control blood pressure.  Fats.  Foods containing healthful fats, such as nuts, avocado, olive oil, canola oil, and fish. DOES EVERYONE WITH DIABETES MELLITUS HAVE THE SAME MEAL PLAN? Because every person with diabetes mellitus is different, there is not one meal plan that works for everyone. It is very important that you meet with a dietitian who will help you create a meal plan that is just right for you.   This information is not intended to replace advice given to you by your health care provider. Make sure you discuss any questions you have with your health care provider.   Document Released: 07/24/2005 Document Revised: 11/17/2014 Document Reviewed: 09/23/2013 Elsevier Interactive Patient Education [employer].

## 2016-07-31 NOTE — Progress Notes (Signed)
Patient ID: Brenda Rosales, female   DOB: 1986/02/04, 30 y.o.   MRN: [id number]      Patient ID: Brenda Rosales, female  DOB: 1986-02-15, 30 y.o.   MRN: [id number]  Subjective:  Brenda Rosales is a 30 y.o. female present for follow up on uncontrolled diabetes.  Patient Care Team    Relationship Specialty Notifications Start End  Ma Hillock, DO PCP - General Family Medicine  04/23/16    Comment: Patient request  Nunzio Cobbs, MD Consulting Physician Obstetrics and Gynecology  04/23/16   Deneise Lever, MD Consulting Physician Pulmonary Disease  04/23/16   Irene Shipper, MD Consulting Physician Gastroenterology  04/23/16    Diabetes: patient present for follow up on her newly re-diagnosed uncontrolled diabetes. She was seen about 5 months ago and started on metformin taper after an 8.7 of a1c. She did not start her metformin. She states she wanted to try diet and exercise first because that is how she did it before and never took medicine.  She voiced last OV she did not think she needed medicine. She endorses cardio exercise 3x a week for 30 minutes and decreasing her carbs/sugar content in meals. She has been diagnosed with an a1c 11% in 2012, and never took medications. She was agreeable to fish oil supplement (triglycerides > 500) and nutrition/diabetes education, referrals were made, but she did not attend. She states it would have cost her > $300. She denies hyper/hypoglycemic events, neuropathy or nonhealing wounds. - referred to nutrition did not attend - does not desire to monitor glucose.  - Urine Microalbumin w/creat. Ratio 04/23/2016: WNL - eye exam: 08/2015--> requesting records [facility] [place] - foot exam completed 07/31/2016 - flu shot administered needed-->  today.  - PPSV 23 2008, will offer prevnar next visit.  - A1c 8.7--> 8.6 (07/31/2016)   Recent Results (from the past 2160 hour(s))  POCT glycosylated hemoglobin (Hb A1C)     Status: Abnormal   Collection Time: 07/31/16  8:16 AM  Result Value Ref Range   Hemoglobin A1C 8.6      Immunization History  Administered Date(s) Administered  . Influenza Whole 08/19/2009  . Influenza,inj,Quad PF,36+ Mos 07/31/2016  . Influenza-Unspecified 08/11/2015  . Pneumococcal Polysaccharide-23 07/23/2007  . Td 09/11/2004  . Tdap 07/04/2012     Past Medical History:  Diagnosis Date  . Allergy   . Anxiety   . ASTHMA 11/19/2007  . CHICKENPOX 11/19/2007  . Diabetes mellitus without complication (HCC)    diet controlled  . Duodenitis   . Dysmenorrhea 11/19/2007  . DYSPHAGIA 11/19/2010  . ECZEMA 11/19/2007  . Esophageal stricture   . Esophagitis   . FATTY LIVER DISEASE 04/17/2008  . GERD 11/20/2010  . Hyperlipidemia   . IBS (irritable bowel syndrome)   . Infection of eyelid 08/07/2011  . Other and unspecified noninfectious gastroenteritis and colitis(558.9) 02/2014  . RHINOSINUSITIS, CHRONIC 12/09/2007  . TB SKIN TEST, POSITIVE 11/19/2007   Allergies  Allergen Reactions  . Vancomycin Other (See Comments)    redmans syndrome    Past Surgical History:  Procedure Laterality Date  . CT SINUS LTD W/O CM  09/23/07  . Mole excision     x's 4   Family History  Problem Relation Age of Onset  . Adopted: Yes   Social History   Social History  . Marital status: Single    Spouse name: N/A  . Number of children: 0  . Years of education: N/A  Occupational History  . Teacher [employer]   Social History Main Topics  . Smoking status: Never Smoker  . Smokeless tobacco: Never Used  . Alcohol use 0.6 oz/week    1 Standard drinks or equivalent per week     Comment: SOCIALLY  . Drug use: No  . Sexual activity: Yes    Partners: Male    Birth control/ protection: Pill     Comment: Hulda Humphrey   Other Topics Concern  . Not on file   Social History Narrative   Adopted from [place].    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   Drinks caffeine,  uses herbal remedies   Wears her seatbelt, wears bicycle helmet   Reports routine exercise    Smoke detector in the home.   Feels safe in relationships.       ROS: Negative, with the exception of above mentioned in HPI  Objective: BP 116/76 (BP Location: Right Arm, Patient Position: Sitting, Cuff Size: Normal)   Pulse 74   Temp 98.4 F (36.9 C)   Resp 20   Ht 5\' 4"  (1.626 m)   Wt 150 lb (68 kg)   SpO2 98%   BMI 25.75 kg/m  Gen: Afebrile. No acute distress. Nontoxic in appearance, well-developed, well-nourished, female HENT: AT. [place]. MMM, no oral lesions Eyes:Pupils Equal Round Reactive to light, Extraocular movements intact,  Conjunctiva without redness, discharge or icterus. Neck/lymp/endocrine: Supple, no lymphadenopathy, no thyromegaly CV: RRR, no edema, +2/4 P posterior tibialis pulses. Chest: CTAB, no wheeze, rhonchi or crackles Abd: Soft. round. NTND. BS present.  Skin: Warm and well-perfused. Skin intact. Neuro/Msk:Normal gait. PERLA. EOMi. Alert. Oriented x3.   Diabetic Foot Exam - Simple   Simple Foot Form Diabetic Foot exam was performed with the following findings:  Yes 07/31/2016  8:13 AM  Visual Inspection No deformities, no ulcerations, no other skin breakdown bilaterally:  Yes Sensation Testing Intact to touch and monofilament testing bilaterally:  Yes Pulse Check Posterior Tibialis and Dorsalis pulse intact bilaterally:  Yes Comments Mild toenail thickening.        Assessment/plan: Brenda Rosales is a 30 y.o. female present for establishment of care.  Uncontrolled type 2 diabetes mellitus without complication, without long-term current use of insulin ([place]) - She never started medications prescribed last visit. Again long discussion on uncontrolled diabetes and its affect on the body. - referred to nutrition did not attend - does not desire to monitor glucose.  - Urine Microalbumin w/creat. Ratio 04/23/2016: WNL - eye exam: 08/2015--> requesting  records [facility] in [place] [facility] - foot exam completed 07/31/2016 - flu shot administered needed--> administered  today.  - PPSV 23 2008, will offer prevnar next visit.  - A1c 8.7--> 8.6 (07/31/2016) - Discussed vaccinations, eye and foot care in diabetes.  - START: metFORMIN (GLUCOPHAGE) 500 MG tablet; 500 mg BID for 1 week, then 1000 mg BID  Dispense: 120 tablet; Refill: 3 - F/U 3 months  Return in about 3 months (around 11/04/2016), or Diabetes.  > 25 minutes spent with patient, >50% of time spent face to face counseling patient and coordinating care.   Electronically signed by: Howard Pouch, DO [place]

## 2016-08-12 ENCOUNTER — Telehealth: Payer: Self-pay | Admitting: *Deleted

## 2016-08-12 NOTE — Telephone Encounter (Signed)
No adjustments need to be made. Metformin does not cause low blood sugars.

## 2016-08-12 NOTE — Telephone Encounter (Signed)
Patient called and left message that she will be starting a 30 day diet plan that eliminates all sugar from her diet on Aug 24, 2016. She would like to know if any medication adjustments need to be made since she is on metformin. Please advise.

## 2016-08-13 NOTE — Telephone Encounter (Signed)
Left message on patient voice mail with information. 

## 2016-08-29 ENCOUNTER — Encounter: Payer: Self-pay | Admitting: Family Medicine

## 2016-08-29 ENCOUNTER — Ambulatory Visit (INDEPENDENT_AMBULATORY_CARE_PROVIDER_SITE_OTHER): Payer: BC Managed Care – PPO | Admitting: Family Medicine

## 2016-08-29 VITALS — BP 106/70 | HR 56 | Temp 98.4°F | Resp 20 | Wt 145.8 lb

## 2016-08-29 DIAGNOSIS — J029 Acute pharyngitis, unspecified: Secondary | ICD-10-CM

## 2016-08-29 DIAGNOSIS — I889 Nonspecific lymphadenitis, unspecified: Secondary | ICD-10-CM

## 2016-08-29 MED ORDER — AMOXICILLIN-POT CLAVULANATE 875-125 MG PO TABS: 1.0000 | ORAL_TABLET | Freq: Two times a day (BID) | ORAL | 0 refills | Status: DC

## 2016-08-29 MED ORDER — METHYLPREDNISOLONE ACETATE 80 MG/ML IJ SUSP
80.0000 mg | Freq: Once | INTRAMUSCULAR | Status: AC
  Administered 2016-08-29: 80 mg via INTRAMUSCULAR

## 2016-08-29 NOTE — Progress Notes (Signed)
Brenda Rosales , August 02, 1986, 30 y.o., female MRN: [id number] Patient Care Team    Relationship Specialty Notifications Start End  Ma Hillock, DO PCP - General Family Medicine  04/23/16    Comment: Patient request  Nunzio Cobbs, MD Consulting Physician Obstetrics and Gynecology  04/23/16   Deneise Lever, MD Consulting Physician Pulmonary Disease  04/23/16   Irene Shipper, MD Consulting Physician Gastroenterology  04/23/16     CC: neck swelling Subjective: Pt presents for an acute OV with complaints of neck swelling of 1 day  duration.  Associated symptoms include sore throat. She reports she woke up today and noticed the left side of her neck was swollen. She has been able to tolerate PO, however around lunch time her throat became more sore with eating. She denies choking, shortness of breath, throat tightening, fever, chills, nausea, vomit, diarrhea or rash. She has not URI symptoms.  Allergies  Allergen Reactions  . Vancomycin Other (See Comments)    redmans syndrome    Social History  Substance Use Topics  . Smoking status: Never Smoker  . Smokeless tobacco: Never Used  . Alcohol use 0.6 oz/week    1 Standard drinks or equivalent per week     Comment: SOCIALLY   Past Medical History:  Diagnosis Date  . Allergy   . Anxiety   . ASTHMA 11/19/2007  . CHICKENPOX 11/19/2007  . Diabetes mellitus without complication (HCC)    diet controlled  . Duodenitis   . Dysmenorrhea 11/19/2007  . DYSPHAGIA 11/19/2010  . ECZEMA 11/19/2007  . Esophageal stricture   . Esophagitis   . FATTY LIVER DISEASE 04/17/2008  . GERD 11/20/2010  . Hyperlipidemia   . IBS (irritable bowel syndrome)   . Infection of eyelid 08/07/2011  . Other and unspecified noninfectious gastroenteritis and colitis(558.9) 02/2014  . RHINOSINUSITIS, CHRONIC 12/09/2007  . TB SKIN TEST, POSITIVE 11/19/2007   Past Surgical History:  Procedure Laterality Date  . CT SINUS LTD W/O CM  09/23/07  . Mole excision     x's  4   Family History  Problem Relation Age of Onset  . Adopted: Yes     Medication List       Accurate as of 08/29/16  3:38 PM. Always use your most recent med list.          AEROCHAMBER MV inhaler   albuterol 108 (90 Base) MCG/ACT inhaler Commonly known as:  PROVENTIL HFA;VENTOLIN HFA Inhale 2 puffs into the lungs every 6 (six) hours as needed for wheezing or shortness of breath.   DULERA 100-5 MCG/ACT Aero Generic drug:  mometasone-formoterol Inhale 2 puffs into the lungs 2 (two) times daily.   EPINEPHrine 0.3 mg/0.3 mL Soaj injection Commonly known as:  EPI-PEN Inject 0.3 mg into the muscle once.   fexofenadine 180 MG tablet Commonly known as:  ALLEGRA Take 180 mg by mouth daily.   metFORMIN 500 MG tablet Commonly known as:  GLUCOPHAGE 500 mg BID for 1 week, then 1000 mg BID   norethindrone-ethinyl estradiol 0.4-35 MG-MCG tablet Commonly known as:  BALZIVA Take 1 tablet by mouth daily.   tacrolimus 0.1 % ointment Commonly known as:  PROTOPIC Apply topically 2 (two) times daily.       No results found for this or any previous visit (from the past 24 hour(s)). No results found.   ROS: Negative, with the exception of above mentioned in HPI   Objective:  BP 106/70 (BP Location:  Right Arm, Patient Position: Sitting, Cuff Size: Normal)   Pulse (!) 56   Temp 98.4 F (36.9 C)   Resp 20   Wt 145 lb 12 oz (66.1 kg)   SpO2 97%   BMI 25.02 kg/m  Body mass index is 25.02 kg/m. Gen: Afebrile. No acute distress. Nontoxic in appearance, well developed, well nourished.  HENT: AT. [place]. Bilateral TM visualized mild pink bilateral. MMM, no oral lesions. Bilateral nares with no erythema or swelling. Throat with mild erythema left peritonsil area, no tonsil enlargement, no exudates. No cough or hoarseness on exam.  Eyes:Pupils Equal Round Reactive to light, Extraocular movements intact,  Conjunctiva without redness, discharge or icterus. Neck/lymp/endocrine: Supple,  large left ant cervical  lymphadenopathy CV: RRR  Chest: CTAB, no wheeze or crackles. Good air movement, normal resp effort.  Abd: Soft.NTND. BS present Skin: no  rashes, purpura or petechiae.  Neuro: PERLA. EOMi. Alert. Oriented x3  Psych: Normal affect, dress and demeanor. Normal speech. Normal thought content and judgment.  Assessment/Plan: Antiqua Afolabi is a 30 y.o. female present for acute OV for  Lymphadenitis/Sore throat - pt is rather asymptomatic considering size of the inflammation. - IM depo medrol - Start Augmentin - f/u 4 days. If worsening over the weekend she is to be seen immediately. Discussed emergent precautions/signs symptoms.    > 25 minutes spent with patient, >50% of time spent face to face counseling patient and coordinating care.    electronically signed by:  Howard Pouch, DO  [place]

## 2016-08-29 NOTE — Patient Instructions (Signed)
Start antibiotic today every 12 hours.  Steroid shot today to help with inflammatory response.  If fever, chills, worsening in the swelling or difficulty swallowing/drooling then be seen immediately in the ED over the weekend.

## 2016-09-02 ENCOUNTER — Ambulatory Visit (INDEPENDENT_AMBULATORY_CARE_PROVIDER_SITE_OTHER): Payer: BC Managed Care – PPO | Admitting: Family Medicine

## 2016-09-02 ENCOUNTER — Encounter: Payer: Self-pay | Admitting: Family Medicine

## 2016-09-02 VITALS — BP 99/59 | HR 55 | Temp 98.0°F | Resp 20 | Ht 64.0 in | Wt 144.0 lb

## 2016-09-02 DIAGNOSIS — I889 Nonspecific lymphadenitis, unspecified: Secondary | ICD-10-CM

## 2016-09-02 NOTE — Patient Instructions (Signed)
I am so glad you are feeling better. Finish antibiotics until completed full dose.

## 2016-09-02 NOTE — Progress Notes (Signed)
Brenda Rosales , [birth year], 30 y.o., female MRN: [id number] Patient Care Team    Relationship Specialty Notifications Start End  Ma Hillock, DO PCP - General Family Medicine  04/23/16    Comment: Patient request  Nunzio Cobbs, MD Consulting Physician Obstetrics and Gynecology  04/23/16   Deneise Lever, MD Consulting Physician Pulmonary Disease  04/23/16   Irene Shipper, MD Consulting Physician Gastroenterology  04/23/16     CC: Lymphadenitis follow-up Subjective: Patient presents today for follow-up on her lymphadenitis. She received IM Depo-Medrol in office and prescription for Augmentin. She states approximately 48 hours after starting medication the swelling in her neck started to resolve, and is currently completely resolved. She denies any fevers, chills, sore throat or shortness of breath. She is tolerating her Augmentin.  Prior note: Pt presents for an acute OV with complaints of neck swelling of 1 day  duration.  Associated symptoms include sore throat. She reports she woke up today and noticed the left side of her neck was swollen. She has been able to tolerate PO, however around lunch time her throat became more sore with eating. She denies choking, shortness of breath, throat tightening, fever, chills, nausea, vomit, diarrhea or rash. She has not URI symptoms.  Allergies  Allergen Reactions  . Vancomycin Other (See Comments)    redmans syndrome    Social History  Substance Use Topics  . Smoking status: Never Smoker  . Smokeless tobacco: Never Used  . Alcohol use 0.6 oz/week    1 Standard drinks or equivalent per week     Comment: SOCIALLY   Past Medical History:  Diagnosis Date  . Allergy   . Anxiety   . ASTHMA 11/19/2007  . CHICKENPOX 11/19/2007  . Diabetes mellitus without complication (HCC)    diet controlled  . Duodenitis   . Dysmenorrhea 11/19/2007  . DYSPHAGIA 11/19/2010  . ECZEMA 11/19/2007  . Esophageal stricture   . Esophagitis   . FATTY LIVER  DISEASE 04/17/2008  . GERD 11/20/2010  . Hyperlipidemia   . IBS (irritable bowel syndrome)   . Infection of eyelid 08/07/2011  . Other and unspecified noninfectious gastroenteritis and colitis(558.9) 02/2014  . RHINOSINUSITIS, CHRONIC 12/09/2007  . TB SKIN TEST, POSITIVE 11/19/2007   Past Surgical History:  Procedure Laterality Date  . CT SINUS LTD W/O CM  09/23/07  . Mole excision     x's 4   Family History  Problem Relation Age of Onset  . Adopted: Yes     Medication List       Accurate as of 09/02/16  3:46 PM. Always use your most recent med list.          AEROCHAMBER MV inhaler   albuterol 108 (90 Base) MCG/ACT inhaler Commonly known as:  PROVENTIL HFA;VENTOLIN HFA Inhale 2 puffs into the lungs every 6 (six) hours as needed for wheezing or shortness of breath.   amoxicillin-clavulanate 875-125 MG tablet Commonly known as:  AUGMENTIN Take 1 tablet by mouth 2 (two) times daily.   DULERA 100-5 MCG/ACT Aero Generic drug:  mometasone-formoterol Inhale 2 puffs into the lungs 2 (two) times daily.   EPINEPHrine 0.3 mg/0.3 mL Soaj injection Commonly known as:  EPI-PEN Inject 0.3 mg into the muscle once.   fexofenadine 180 MG tablet Commonly known as:  ALLEGRA Take 180 mg by mouth daily.   metFORMIN 500 MG tablet Commonly known as:  GLUCOPHAGE 500 mg BID for 1 week, then 1000 mg BID  norethindrone-ethinyl estradiol 0.4-35 MG-MCG tablet Commonly known as:  BALZIVA Take 1 tablet by mouth daily.   tacrolimus 0.1 % ointment Commonly known as:  PROTOPIC Apply topically 2 (two) times daily.       No results found for this or any previous visit (from the past 24 hour(s)). No results found.   ROS: Negative, with the exception of above mentioned in HPI   Objective:  BP (!) 99/59 (BP Location: Left Arm, Patient Position: Sitting, Cuff Size: Normal)   Pulse (!) 55   Temp 98 F (36.7 C)   Resp 20   Ht 5\' 4"  (1.626 m)   Wt 144 lb (65.3 kg)   SpO2 99%   BMI 24.72  kg/m  Body mass index is 24.72 kg/m. Gen: Afebrile. No acute distress. Nontoxic in appearance, well developed, well nourished.  HENT: AT. [place]. MMM, no oral lesions. Throat without erythema, exudates or swelling. No cough on exam, no hoarseness on exam.   Eyes:Pupils Equal Round Reactive to light, Extraocular movements intact,  Conjunctiva without redness, discharge or icterus. Neck/lymp/endocrine: Supple, no lymphadenopathy   Assessment/Plan: Brenda Rosales is a 30 y.o. female present for acute OV for  Lymphadenitis/Sore throat - resolved. Continue Augmentin until antibiotic course completed. - F/u PRN    electronically signed by:  Howard Pouch, DO  [place]

## 2016-10-30 ENCOUNTER — Ambulatory Visit: Payer: BC Managed Care – PPO | Admitting: Family Medicine

## 2016-11-06 ENCOUNTER — Encounter: Payer: Self-pay | Admitting: Family Medicine

## 2016-11-06 ENCOUNTER — Ambulatory Visit (INDEPENDENT_AMBULATORY_CARE_PROVIDER_SITE_OTHER): Payer: BC Managed Care – PPO | Admitting: Family Medicine

## 2016-11-06 VITALS — BP 104/68 | HR 73 | Temp 98.5°F | Resp 20 | Ht 64.0 in | Wt 140.5 lb

## 2016-11-06 DIAGNOSIS — Z23 Encounter for immunization: Secondary | ICD-10-CM | POA: Diagnosis not present

## 2016-11-06 DIAGNOSIS — E1169 Type 2 diabetes mellitus with other specified complication: Secondary | ICD-10-CM | POA: Insufficient documentation

## 2016-11-06 DIAGNOSIS — E119 Type 2 diabetes mellitus without complications: Secondary | ICD-10-CM

## 2016-11-06 LAB — POCT GLYCOSYLATED HEMOGLOBIN (HGB A1C): Hemoglobin A1C: 6.2

## 2016-11-06 MED ORDER — METFORMIN HCL 1000 MG PO TABS: 1000.0000 mg | ORAL_TABLET | Freq: Two times a day (BID) | ORAL | 1 refills | Status: DC

## 2016-11-06 NOTE — Progress Notes (Signed)
Patient ID: Brenda Rosales, female   DOB: [birth year], 30 y.o.   MRN: [id number]      Patient ID: Brenda Rosales, female  DOB: 06-Mar-1986, 30 y.o.   MRN: [id number]  Subjective:  Brenda Rosales is a 30 y.o. female present for follow up on uncontrolled diabetes.  Patient Care Team    Relationship Specialty Notifications Start End  Ma Hillock, DO PCP - General Family Medicine  04/23/16    Comment: Patient request  Nunzio Cobbs, MD Consulting Physician Obstetrics and Gynecology  04/23/16   Deneise Lever, MD Consulting Physician Pulmonary Disease  04/23/16   Irene Shipper, MD Consulting Physician Gastroenterology  04/23/16    Diabetes:  Patient presents for follow-up on her diabetes type 2. She reports compliance went 1000 mg twice a day of metformin (most days). She reports she has increased her cardiovascular workout and she is up to approximately 120 minutes a week now. SHe states she is striving to hit the 150 minutes a week marker suggested. She completed the whole 30 diet, and since has been trying to stick to the food options. She denies any hyper or hypoglycemic events. She denies any nonhealing wounds or numbness and tingling in her lower extremities. She has not desired/refused to monitor her blood glucose daily. She was referred to nutrition but did not attend.  Prior note: patient present for follow up on her newly re-diagnosed uncontrolled diabetes. She was seen about 5 months ago and started on metformin taper after an 8.7 of a1c. She did not start her metformin. She states she wanted to try diet and exercise first because that is how she did it before and never took medicine.  She voiced last OV she did not think she needed medicine. She endorses cardio exercise 3x a week for 30 minutes and decreasing her carbs/sugar content in meals. She has been diagnosed with an a1c 11% in 2012, and never took medications. She was agreeable to fish oil supplement (triglycerides >  500) and nutrition/diabetes education, referrals were made, but she did not attend. She states it would have cost her > $300. She denies hyper/hypoglycemic events, neuropathy or nonhealing wounds. - Urine Microalbumin w/creat. Ratio 04/23/2016: WNL - eye exam: 08/2015--> requesting records [facility] in [place], recommnend yearly  - foot exam completed 07/31/2016 - flu shot UTD - PPSV 23 2008, prevnar today - A1c 8.7--> 8.6--> 6.2  (11/06/2016)   Recent Results (from the past 2160 hour(s))  POCT glycosylated hemoglobin (Hb A1C)     Status: Abnormal   Collection Time: 11/06/16 11:35 AM  Result Value Ref Range   Hemoglobin A1C 6.2      Immunization History  Administered Date(s) Administered  . Influenza Whole 08/19/2009  . Influenza,inj,Quad PF,36+ Mos 07/31/2016  . Influenza-Unspecified 08/11/2015  . Pneumococcal Conjugate-13 11/06/2016  . Pneumococcal Polysaccharide-23 07/23/2007  . Td 09/11/2004  . Tdap 07/04/2012     Past Medical History:  Diagnosis Date  . Allergy   . Anxiety   . ASTHMA 11/19/2007  . CHICKENPOX 11/19/2007  . Diabetes mellitus without complication (HCC)    diet controlled  . Duodenitis   . Dysmenorrhea 11/19/2007  . DYSPHAGIA 11/19/2010  . ECZEMA 11/19/2007  . Esophageal stricture   . Esophagitis   . FATTY LIVER DISEASE 04/17/2008  . GERD 11/20/2010  . Hyperlipidemia   . IBS (irritable bowel syndrome)   . Infection of eyelid 08/07/2011  . Other and unspecified noninfectious gastroenteritis and colitis(558.9) 02/2014  .  RHINOSINUSITIS, CHRONIC 12/09/2007  . TB SKIN TEST, POSITIVE 11/19/2007   Allergies  Allergen Reactions  . Vancomycin Other (See Comments)    redmans syndrome    Past Surgical History:  Procedure Laterality Date  . CT SINUS LTD W/O CM  09/23/07  . Mole excision     x's 4   Family History  Problem Relation Age of Onset  . Adopted: Yes   Social History   Social History  . Marital status: Single    Spouse name: N/A  . Number of  children: 0  . Years of education: N/A   Occupational History  . Teacher [employer]   Social History Main Topics  . Smoking status: Never Smoker  . Smokeless tobacco: Never Used  . Alcohol use 0.6 oz/week    1 Standard drinks or equivalent per week     Comment: SOCIALLY  . Drug use: No  . Sexual activity: Yes    Partners: Male    Birth control/ protection: Pill     Comment: Brenda Rosales   Other Topics Concern  . Not on file   Social History Narrative   Adopted from [place].    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   Drinks caffeine, uses herbal remedies   Wears her seatbelt, wears bicycle helmet   Reports routine exercise    Smoke detector in the home.   Feels safe in relationships.       ROS: Negative, with the exception of above mentioned in HPI  Objective: BP 104/68 (BP Location: Right Arm, Patient Position: Sitting, Cuff Size: Normal)   Pulse 73   Temp 98.5 F (36.9 C)   Resp 20   Ht 5\' 4"  (1.626 m)   Wt 140 lb 8 oz (63.7 kg)   SpO2 99%   BMI 24.12 kg/m  Gen: Afebrile. No acute distress. Nontoxic in appearance, well-developed, well-nourished, female HENT: AT. [place]. MMM, no oral lesions Eyes:Pupils Equal Round Reactive to light, Extraocular movements intact,  Conjunctiva without redness, discharge or icterus. Neck/lymp/endocrine: Supple, no lymphadenopathy, no thyromegaly CV: RRR, no edema, +2/4 P posterior tibialis pulses. Chest: CTAB, no wheeze, rhonchi or crackles Abd: Soft. round. NTND. BS present.  Skin: Warm and well-perfused. Skin intact. Neuro/Msk:Normal gait. PERLA. EOMi. Alert. Oriented x3.    Results for orders placed or performed in visit on 11/06/16 (from the past 24 hour(s))  POCT glycosylated hemoglobin (Hb A1C)     Status: Abnormal   Collection Time: 11/06/16 11:35 AM  Result Value Ref Range   Hemoglobin A1C 6.2     Assessment/plan: [name] is a 30 y.o. female present for Diabetes  follow-up Diabetes mellitus without complication ([place]) - Urine Microalbumin w/creat. Ratio 04/23/2016: WNL. Follow yearly - eye exam: 08/2015--> requesting records [facility] in [place], recommnend yearly  - foot exam completed 07/31/2016. Follow yearly. Foot care explained to patient. - flu shot UTD 2017 - PPSV 23 2008, prevnar today (11/06/2016) - A1c 8.7--> 8.6--> 6.2  (11/06/2016) - Metformin 1000 mg twice a day prescribed. - Congratulated her on lowering A1c. She now seems more motivated seeing positive results. Continue diet and exercise modifications. - Follow-up every 3 months  Need for vaccination with 13-polyvalent pneumococcal conjugate vaccine - Pneumococcal conjugate vaccine 13-valent   Return in about 3 months (around 02/09/2017), or diabetes. .   Electronically signed by: Howard Pouch, DO [place]

## 2016-11-06 NOTE — Patient Instructions (Signed)
Your a1c looks much better today!!! It is 6.2. This is right where you need to be.    I have called in the 1000 mg tablet (take one every 12 hours).  Follow up in 3 months, around first week in April.    GREAT job.

## 2016-11-10 DIAGNOSIS — R7989 Other specified abnormal findings of blood chemistry: Secondary | ICD-10-CM

## 2016-11-10 HISTORY — DX: Other specified abnormal findings of blood chemistry: R79.89

## 2016-11-15 ENCOUNTER — Encounter (HOSPITAL_COMMUNITY): Payer: Self-pay

## 2016-11-15 ENCOUNTER — Emergency Department (HOSPITAL_COMMUNITY)
Admission: EM | Admit: 2016-11-15 | Discharge: 2016-11-15 | Disposition: A | Discharge status: Home / Self Care | Payer: BC Managed Care – PPO | Attending: Emergency Medicine | Admitting: Emergency Medicine

## 2016-11-15 ENCOUNTER — Emergency Department (HOSPITAL_COMMUNITY): Payer: BC Managed Care – PPO

## 2016-11-15 DIAGNOSIS — E119 Type 2 diabetes mellitus without complications: Secondary | ICD-10-CM | POA: Diagnosis not present

## 2016-11-15 DIAGNOSIS — Y939 Activity, unspecified: Secondary | ICD-10-CM | POA: Insufficient documentation

## 2016-11-15 DIAGNOSIS — Y929 Unspecified place or not applicable: Secondary | ICD-10-CM | POA: Diagnosis not present

## 2016-11-15 DIAGNOSIS — S51852A Open bite of left forearm, initial encounter: Secondary | ICD-10-CM | POA: Insufficient documentation

## 2016-11-15 DIAGNOSIS — J45909 Unspecified asthma, uncomplicated: Secondary | ICD-10-CM | POA: Insufficient documentation

## 2016-11-15 DIAGNOSIS — S51812A Laceration without foreign body of left forearm, initial encounter: Secondary | ICD-10-CM | POA: Insufficient documentation

## 2016-11-15 DIAGNOSIS — Z7984 Long term (current) use of oral hypoglycemic drugs: Secondary | ICD-10-CM | POA: Insufficient documentation

## 2016-11-15 DIAGNOSIS — W540XXA Bitten by dog, initial encounter: Secondary | ICD-10-CM | POA: Diagnosis not present

## 2016-11-15 DIAGNOSIS — Y999 Unspecified external cause status: Secondary | ICD-10-CM | POA: Insufficient documentation

## 2016-11-15 MED ORDER — TRAMADOL HCL 50 MG PO TABS: 50.0000 mg | ORAL_TABLET | Freq: Four times a day (QID) | ORAL | 0 refills | Status: DC | PRN

## 2016-11-15 MED ORDER — HYDROCODONE-ACETAMINOPHEN 5-325 MG PO TABS
1.0000 | ORAL_TABLET | Freq: Once | ORAL | Status: AC
  Administered 2016-11-15: 1 via ORAL
  Filled 2016-11-15: qty 1

## 2016-11-15 MED ORDER — IBUPROFEN 600 MG PO TABS: 600.0000 mg | ORAL_TABLET | Freq: Four times a day (QID) | ORAL | 0 refills | Status: DC | PRN

## 2016-11-15 MED ORDER — AMOXICILLIN-POT CLAVULANATE 875-125 MG PO TABS
1.0000 | ORAL_TABLET | Freq: Once | ORAL | Status: AC
  Administered 2016-11-15: 1 via ORAL
  Filled 2016-11-15: qty 1

## 2016-11-15 MED ORDER — AMOXICILLIN-POT CLAVULANATE 875-125 MG PO TABS: 1.0000 | ORAL_TABLET | Freq: Two times a day (BID) | ORAL | 0 refills | Status: DC

## 2016-11-15 MED ORDER — LIDOCAINE-EPINEPHRINE (PF) 2 %-1:200000 IJ SOLN
10.0000 mL | Freq: Once | INTRAMUSCULAR | Status: DC
  Filled 2016-11-15: qty 20

## 2016-11-15 NOTE — Discharge Instructions (Signed)
Take your antibiotic as prescribed until completed. Continue keeping wound clean using antibacterial soap and water, pat dry. You may apply a small amount of neosporin ointment to wound daily.  I recommend following up with your primary care provider in 3-4 days for wound recheck. Please return to the Emergency Department if symptoms worsen or new onset of fever, redness, swelling, warmth, drainage, numbness, tingling, weakness, decreased range of motion of left hand/wrist/forearm/elbow.

## 2016-11-15 NOTE — ED Provider Notes (Signed)
[facility] DEPT Provider Note   CSN: [id number] Arrival date & time: 11/15/16  1916     History   Chief Complaint Chief Complaint  Patient presents with  . Animal Bite    HPI Brenda Rosales is a 31 y.o. female.  HPI   Pt is a 31 yo female with no pertinent PMH who presents to the ED s/p animal bite that occurred 20 minutes prior to arrival. Pt reports she was trying to hold her dog back from playing with another dog with her dog accidentally bit her left forearm. Pt reports her dogs rabies/vaccines are UTD. Pt's tetanus UTD. Pt reports associated pain and swelling to left forearm, pain is worse with movement of left hand/arm. Bleeding controlled PTA. Denies numbness, tingling, weakness, fever. Denies taking any meds PTA.   Past Medical History:  Diagnosis Date  . Allergy   . Anxiety   . ASTHMA 11/19/2007  . CHICKENPOX 11/19/2007  . Diabetes mellitus without complication (HCC)    diet controlled  . Duodenitis   . Dysmenorrhea 11/19/2007  . DYSPHAGIA 11/19/2010  . ECZEMA 11/19/2007  . Esophageal stricture   . Esophagitis   . FATTY LIVER DISEASE 04/17/2008  . GERD 11/20/2010  . Hyperlipidemia   . IBS (irritable bowel syndrome)   . Infection of eyelid 08/07/2011  . Other and unspecified noninfectious gastroenteritis and colitis(558.9) 02/2014  . RHINOSINUSITIS, CHRONIC 12/09/2007  . TB SKIN TEST, POSITIVE 11/19/2007    Patient Active Problem List   Diagnosis Date Noted  . Diabetes mellitus without complication ([place]) 123456  . Hypertriglyceridemia 04/23/2016  . Allergic rhinitis 03/01/2015  . GERD 11/20/2010  . FATTY LIVER DISEASE 04/17/2008  . Asthma, persistent controlled 11/19/2007    Past Surgical History:  Procedure Laterality Date  . CT SINUS LTD W/O CM  09/23/07  . Mole excision     x's 4    OB History    Gravida Para Term Preterm AB Living   0 0 0 0   0   SAB TAB Ectopic Multiple Live Births                   Home Medications    Prior to Admission  medications   Medication Sig Start Date End Date Taking? Authorizing Provider  albuterol (PROVENTIL HFA;VENTOLIN HFA) 108 (90 BASE) MCG/ACT inhaler Inhale 2 puffs into the lungs every 6 (six) hours as needed for wheezing or shortness of breath. 02/21/14   Biagio Borg, MD  amoxicillin-clavulanate (AUGMENTIN) 875-125 MG tablet Take 1 tablet by mouth every 12 (twelve) hours. 11/15/16   Nona Dell, PA-C  EPINEPHrine 0.3 mg/0.3 mL IJ SOAJ injection Inject 0.3 mg into the muscle once.    Historical Provider, MD  fexofenadine (ALLEGRA) 180 MG tablet Take 180 mg by mouth daily.     Historical Provider, MD  ibuprofen (ADVIL,MOTRIN) 600 MG tablet Take 1 tablet (600 mg total) by mouth every 6 (six) hours as needed. 11/15/16   Nona Dell, PA-C  metFORMIN (GLUCOPHAGE) 1000 MG tablet Take 1 tablet (1,000 mg total) by mouth 2 (two) times daily with a meal. 11/06/16   Renee A Kuneff, DO  mometasone-formoterol (DULERA) 100-5 MCG/ACT AERO Inhale 2 puffs into the lungs 2 (two) times daily.    Historical Provider, MD  norethindrone-ethinyl estradiol Hulda Humphrey) 0.4-35 MG-MCG tablet Take 1 tablet by mouth daily. 02/20/16   Nunzio Cobbs, MD  Spacer/Aero-Holding Chambers (AEROCHAMBER MV) inhaler  04/10/16   Historical Provider, MD  tacrolimus (PROTOPIC) 0.1 % ointment Apply topically 2 (two) times daily. Patient taking differently: Apply 1 application topically 2 (two) times daily as needed (for rash, skin issues).  06/22/14   Roselee Culver, MD  traMADol (ULTRAM) 50 MG tablet Take 1 tablet (50 mg total) by mouth every 6 (six) hours as needed. 11/15/16   Nona Dell, PA-C    Family History Family History  Problem Relation Age of Onset  . Adopted: Yes    Social History Social History  Substance Use Topics  . Smoking status: Never Smoker  . Smokeless tobacco: Never Used  . Alcohol use 0.6 oz/week    1 Standard drinks or equivalent per week     Comment: SOCIALLY      Allergies   Vancomycin   Review of Systems Review of Systems  Musculoskeletal: Positive for arthralgias (left forearm).  Skin: Positive for wound.  All other systems reviewed and are negative.    Physical Exam Updated Vital Signs BP 129/97 (BP Location: Right Arm)   Pulse 76   Temp 97.6 F (36.4 C) (Oral)   Resp 19   Ht 5\' 3"  (1.6 m)   Wt 61.7 kg   LMP 11/10/2016   SpO2 98%   BMI 24.09 kg/m   Physical Exam  Constitutional: She is oriented to person, place, and time. She appears well-developed and well-nourished.  HENT:  Head: Normocephalic and atraumatic.  Eyes: Conjunctivae and EOM are normal. Right eye exhibits no discharge. Left eye exhibits no discharge. No scleral icterus.  Neck: Normal range of motion. Neck supple.  Cardiovascular: Normal rate and intact distal pulses.   Pulmonary/Chest: Effort normal.  Musculoskeletal: Normal range of motion. She exhibits tenderness. She exhibits no edema or deformity.  Mild swelling present to left mid forearm. FROM of left hand, wrist, forearm and elbow with 5/5 strength. Sensation grossly intact. 2+ radial pulse.   Neurological: She is alert and oriented to person, place, and time.  Skin: Skin is warm and dry.  2 puncture wounds noted to left mid-forearm. 2cm laceration noted to dorsal aspect of left mid-forarm, gaping appx. 0.5cm, subcutaneous fat visible. No active bleeding.  Nursing note and vitals reviewed.    ED Treatments / Results  Labs (all labs ordered are listed, but only abnormal results are displayed) Labs Reviewed - No data to display  EKG  EKG Interpretation None       Radiology Dg Forearm Left  Result Date: 11/15/2016 CLINICAL DATA:  31 year old who sustained a dog bite to the left forearm earlier today. Initial encounter. EXAM: LEFT FOREARM - 2 VIEW COMPARISON:  None. FINDINGS: No evidence of acute fracture involving the radius or ulna. Visualized wrist joint and elbow joint intact. Gas within  the soft tissues of the volar and medial forearm. IMPRESSION: No osseous abnormality.  Gas within the soft tissues of the forearm. Electronically Signed   By: Evangeline Dakin M.D.   On: 11/15/2016 21:12    Procedures Procedures (including critical care time)  Medications Ordered in ED Medications  lidocaine-EPINEPHrine (XYLOCAINE W/EPI) 2 %-1:200000 (PF) injection 10 mL (not administered)  amoxicillin-clavulanate (AUGMENTIN) 875-125 MG per tablet 1 tablet (not administered)  HYDROcodone-acetaminophen (NORCO/VICODIN) 5-325 MG per tablet 1 tablet (1 tablet Oral Given 11/15/16 2100)     Initial Impression / Assessment and Plan / ED Course  I have reviewed the triage vital signs and the nursing notes.  Pertinent labs & imaging results that were available during my care of the patient were  reviewed by me and considered in my medical decision making (see chart for details).  Clinical Course     Pt presents with multiple puncture wounds due to being bit by her dog while trying to prevent him from playing with another dog. Bleeding controlled PTA. Pt's tetanus UTD. Pt's dog's rabies and vaccines UTD. VSS. Exam revealed 2 puncture wounds to left forearm and 2cm gaping laceration to forarm with subcutaneous fat visible. Left UE neurovascularly intact. Left forearm xray negative. Wound copiously irrigated in the ED. Shared decision making was made with pt regarding closure of gaping laceration. Pt reported understanding regarding increased risk of infection with closure of wound and prolonged wound healing with leaving wound open and reported that she did not want the wound closed. Dressing applied to wound in the ED. Plan to d/c pt home with antibiotics, wound care and symptomatic tx. Discussed strict return precautions including signs of infection.  Final Clinical Impressions(s) / ED Diagnoses   Final diagnoses:  Dog bite, initial encounter    New Prescriptions New Prescriptions    AMOXICILLIN-CLAVULANATE (AUGMENTIN) 875-125 MG TABLET    Take 1 tablet by mouth every 12 (twelve) hours.   IBUPROFEN (ADVIL,MOTRIN) 600 MG TABLET    Take 1 tablet (600 mg total) by mouth every 6 (six) hours as needed.   TRAMADOL (ULTRAM) 50 MG TABLET    Take 1 tablet (50 mg total) by mouth every 6 (six) hours as needed.     Chesley Noon [place], [place] 11/15/16 [place], MD 11/16/16 [phone]

## 2016-11-15 NOTE — ED Notes (Signed)
Pt was bit by own dog while trying to break up dog fight outside. Four puncture wounds on left forearm. Bleeding controlled. Last rabies shot for dog 2 years ago. Tingling and coldness in left hand, pain when opening hand.

## 2017-01-23 ENCOUNTER — Encounter: Payer: Self-pay | Admitting: Family Medicine

## 2017-01-23 ENCOUNTER — Ambulatory Visit (INDEPENDENT_AMBULATORY_CARE_PROVIDER_SITE_OTHER): Payer: BC Managed Care – PPO | Admitting: Family Medicine

## 2017-01-23 VITALS — BP 111/78 | HR 68 | Temp 98.2°F | Resp 20 | Wt 143.2 lb

## 2017-01-23 DIAGNOSIS — J4541 Moderate persistent asthma with (acute) exacerbation: Secondary | ICD-10-CM | POA: Diagnosis not present

## 2017-01-23 DIAGNOSIS — J301 Allergic rhinitis due to pollen: Secondary | ICD-10-CM | POA: Diagnosis not present

## 2017-01-23 MED ORDER — IPRATROPIUM-ALBUTEROL 0.5-2.5 (3) MG/3ML IN SOLN
3.0000 mL | Freq: Once | RESPIRATORY_TRACT | Status: AC
  Administered 2017-01-23: 3 mL via RESPIRATORY_TRACT

## 2017-01-23 MED ORDER — PREDNISONE 50 MG PO TABS: 50.0000 mg | ORAL_TABLET | Freq: Every day | ORAL | 0 refills | Status: DC

## 2017-01-23 MED ORDER — LEVOCETIRIZINE DIHYDROCHLORIDE 5 MG PO TABS: 5.0000 mg | ORAL_TABLET | Freq: Every evening | ORAL | 3 refills | Status: DC

## 2017-01-23 MED ORDER — DOXYCYCLINE HYCLATE 100 MG PO TABS: 100.0000 mg | ORAL_TABLET | Freq: Two times a day (BID) | ORAL | 0 refills | Status: DC

## 2017-01-23 NOTE — Progress Notes (Signed)
Brenda Rosales , 04/09/1986, 31 y.o., female MRN: [id number] Patient Care Team    Relationship Specialty Notifications Start End  Ma Hillock, DO PCP - General Family Medicine  04/23/16    Comment: Patient request  Nunzio Cobbs, MD Consulting Physician Obstetrics and Gynecology  04/23/16   Deneise Lever, MD Consulting Physician Pulmonary Disease  04/23/16   Irene Shipper, MD Consulting Physician Gastroenterology  04/23/16     CC: sinus pressure Subjective: Pt presents for an  OV with complaints of sinus pressure of > 1 week duration.  Associated symptoms include wheezing, cough, congestion, itchy eyes, facial pressure, headache.She has been taking allgera routinely and switched to allegra-D. She has a h/o asthma and allergies, but states it has been improved since undergoing allergy shots. She has some hot flashes.   Depression screen [facility] 2/9 04/23/2016 06/19/2015 06/15/2015 06/13/2015 03/24/2013  Decreased Interest 0 0 0 0 0  Down, Depressed, Hopeless 0 0 0 0 0  PHQ - 2 Score 0 0 0 0 0    Allergies  Allergen Reactions  . Vancomycin Other (See Comments)    redmans syndrome    Social History  Substance Use Topics  . Smoking status: Never Smoker  . Smokeless tobacco: Never Used  . Alcohol use 0.6 oz/week    1 Standard drinks or equivalent per week     Comment: SOCIALLY   Past Medical History:  Diagnosis Date  . Allergy   . Anxiety   . ASTHMA 11/19/2007  . CHICKENPOX 11/19/2007  . Diabetes mellitus without complication (HCC)    diet controlled  . Duodenitis   . Dysmenorrhea 11/19/2007  . DYSPHAGIA 11/19/2010  . ECZEMA 11/19/2007  . Esophageal stricture   . Esophagitis   . FATTY LIVER DISEASE 04/17/2008  . GERD 11/20/2010  . Hyperlipidemia   . IBS (irritable bowel syndrome)   . Infection of eyelid 08/07/2011  . Other and unspecified noninfectious gastroenteritis and colitis(558.9) 02/2014  . RHINOSINUSITIS, CHRONIC 12/09/2007  . TB SKIN TEST, POSITIVE 11/19/2007   Past  Surgical History:  Procedure Laterality Date  . CT SINUS LTD W/O CM  09/23/07  . Mole excision     x's 4   Family History  Problem Relation Age of Onset  . Adopted: Yes   Allergies as of 01/23/2017      Reactions   Vancomycin Other (See Comments)   redmans syndrome       Medication List       Accurate as of 01/23/17  9:39 AM. Always use your most recent med list.          AEROCHAMBER MV inhaler   albuterol 108 (90 Base) MCG/ACT inhaler Commonly known as:  PROVENTIL HFA;VENTOLIN HFA Inhale 2 puffs into the lungs every 6 (six) hours as needed for wheezing or shortness of breath.   EPINEPHrine 0.3 mg/0.3 mL Soaj injection Commonly known as:  EPI-PEN Inject 0.3 mg into the muscle once.   fexofenadine 180 MG tablet Commonly known as:  ALLEGRA Take 180 mg by mouth daily.   ibuprofen 600 MG tablet Commonly known as:  ADVIL,MOTRIN Take 1 tablet (600 mg total) by mouth every 6 (six) hours as needed.   metFORMIN 1000 MG tablet Commonly known as:  GLUCOPHAGE Take 1 tablet (1,000 mg total) by mouth 2 (two) times daily with a meal.   norethindrone-ethinyl estradiol 0.4-35 MG-MCG tablet Commonly known as:  BALZIVA Take 1 tablet by mouth daily.   SYMBICORT  160-4.5 MCG/ACT inhaler Generic drug:  budesonide-formoterol Inhale 2 puffs into the lungs 2 (two) times daily.   tacrolimus 0.1 % ointment Commonly known as:  PROTOPIC Apply topically 2 (two) times daily.   traMADol 50 MG tablet Commonly known as:  ULTRAM Take 1 tablet (50 mg total) by mouth every 6 (six) hours as needed.       No results found for this or any previous visit (from the past 24 hour(s)). No results found.   ROS: Negative, with the exception of above mentioned in HPI   Objective:  BP 111/78 (BP Location: Right Arm, Patient Position: Sitting, Cuff Size: Normal)   Pulse 68   Temp 98.2 F (36.8 C)   Resp 20   Wt 143 lb 4 oz (65 kg)   SpO2 97%   BMI 25.38 kg/m  Body mass index is 25.38  kg/m. Gen: Afebrile. No acute distress. Nontoxic in appearance, well developed, well nourished. Appears congested.  HENT: AT. [place]. Bilateral TM visualized fullness. MMM, no oral lesions. Bilateral nares with erythema, drainage. Throat without erythema or exudates. PND, hoarseness, sinus pressure.  Eyes:Pupils Equal Round Reactive to light, Extraocular movements intact,  Conjunctiva without redness, discharge or icterus. Neck/lymp/endocrine: Supple,bilateral ant cervical lymphadenopathy CV: RRR  Chest: bilateral wheezing at bases. Otherwise, CTAB, no wheeze or crackles. Good air movement, normal resp effort.  Abd: Soft. NTND. BS present.  Skin: no rashes, purpura or petechiae.  Neuro: Normal gait. PERLA. EOMi. Alert. Oriented x3  Assessment/Plan: Brenda Rosales is a 31 y.o. female present for OV for  Acute allergic rhinitis due to pollen, unspecified seasonality - levocetirizine (XYZAL) 5 MG tablet; Take 1 tablet (5 mg total) by mouth every evening.  Dispense: 90 tablet; Refill: 3 Moderate persistent asthma with acute exacerbation - SYMBICORT 160-4.5 MCG/ACT inhaler; Inhale 2 puffs into the lungs 2 (two) times daily.; Refill: 3 - doxycycline (VIBRA-TABS) 100 MG tablet; Take 1 tablet (100 mg total) by mouth 2 (two) times daily.  Dispense: 20 tablet; Refill: 0 - predniSONE (DELTASONE) 50 MG tablet; Take 1 tablet (50 mg total) by mouth daily with breakfast.  Dispense: 5 tablet; Refill: 0 doxycyline, prednisone burst Rest, hydrate.  flonase, mucinex DM, xyzal prescribed.  Stop allegra.  F/U PRN  Reviewed expectations re: course of current medical issues.  Discussed self-management of symptoms.  Outlined signs and symptoms indicating need for more acute intervention.  Patient verbalized understanding and all questions were answered.  Patient received an After-Visit Summary.   electronically signed by:  Howard Pouch, DO  [place]

## 2017-01-23 NOTE — Patient Instructions (Signed)
I have called in doxycyline for you and Prednisone burst, 1 tab daily.  Start xyzal daily, stop allegra (called in for you).   Use albuterol for wheezing.   Rest, hydrate. Flonase daily.  Mucinex DM during illness.     Sinusitis, Adult Sinusitis is soreness and inflammation of your sinuses. Sinuses are hollow spaces in the bones around your face. They are located:  Around your eyes.  In the middle of your forehead.  Behind your nose.  In your cheekbones. Your sinuses and nasal passages are lined with a stringy fluid (mucus). Mucus normally drains out of your sinuses. When your nasal tissues get inflamed or swollen, the mucus can get trapped or blocked so air cannot flow through your sinuses. This lets bacteria, viruses, and funguses grow, and that leads to infection. Follow these instructions at home: Medicines   Take, use, or apply over-the-counter and prescription medicines only as told by your doctor. These may include nasal sprays.  If you were prescribed an antibiotic medicine, take it as told by your doctor. Do not stop taking the antibiotic even if you start to feel better. Hydrate and Humidify   Drink enough water to keep your pee (urine) clear or pale yellow.  Use a cool mist humidifier to keep the humidity level in your home above 50%.  Breathe in steam for 10-15 minutes, 3-4 times a day or as told by your doctor. You can do this in the bathroom while a hot shower is running.  Try not to spend time in cool or dry air. Rest   Rest as much as possible.  Sleep with your head raised (elevated).  Make sure to get enough sleep each night. General instructions   Put a warm, moist washcloth on your face 3-4 times a day or as told by your doctor. This will help with discomfort.  Wash your hands often with soap and water. If there is no soap and water, use hand sanitizer.  Do not smoke. Avoid being around people who are smoking (secondhand smoke).  Keep all  follow-up visits as told by your doctor. This is important. Contact a doctor if:  You have a fever.  Your symptoms get worse.  Your symptoms do not get better within 10 days. Get help right away if:  You have a very bad headache.  You cannot stop throwing up (vomiting).  You have pain or swelling around your face or eyes.  You have trouble seeing.  You feel confused.  Your neck is stiff.  You have trouble breathing. This information is not intended to replace advice given to you by your health care provider. Make sure you discuss any questions you have with your health care provider. Document Released: 04/14/2008 Document Revised: 06/22/2016 Document Reviewed: 08/22/2015 Elsevier Interactive Patient Education  2017 [employer].

## 2017-02-06 ENCOUNTER — Emergency Department (HOSPITAL_COMMUNITY)
Admission: EM | Admit: 2017-02-06 | Discharge: 2017-02-06 | Disposition: A | Discharge status: Home / Self Care | Payer: BC Managed Care – PPO | Attending: Emergency Medicine | Admitting: Emergency Medicine

## 2017-02-06 ENCOUNTER — Encounter (HOSPITAL_COMMUNITY): Payer: Self-pay | Admitting: Emergency Medicine

## 2017-02-06 DIAGNOSIS — H01005 Unspecified blepharitis left lower eyelid: Secondary | ICD-10-CM | POA: Diagnosis not present

## 2017-02-06 DIAGNOSIS — H01001 Unspecified blepharitis right upper eyelid: Secondary | ICD-10-CM | POA: Insufficient documentation

## 2017-02-06 DIAGNOSIS — L0100 Impetigo, unspecified: Secondary | ICD-10-CM

## 2017-02-06 DIAGNOSIS — Z7984 Long term (current) use of oral hypoglycemic drugs: Secondary | ICD-10-CM | POA: Insufficient documentation

## 2017-02-06 DIAGNOSIS — H5713 Ocular pain, bilateral: Secondary | ICD-10-CM | POA: Diagnosis present

## 2017-02-06 DIAGNOSIS — L0109 Other impetigo: Secondary | ICD-10-CM

## 2017-02-06 DIAGNOSIS — H01004 Unspecified blepharitis left upper eyelid: Secondary | ICD-10-CM | POA: Diagnosis not present

## 2017-02-06 DIAGNOSIS — H0100B Unspecified blepharitis left eye, upper and lower eyelids: Secondary | ICD-10-CM

## 2017-02-06 DIAGNOSIS — H1033 Unspecified acute conjunctivitis, bilateral: Secondary | ICD-10-CM | POA: Diagnosis not present

## 2017-02-06 DIAGNOSIS — H01002 Unspecified blepharitis right lower eyelid: Secondary | ICD-10-CM | POA: Insufficient documentation

## 2017-02-06 DIAGNOSIS — J45909 Unspecified asthma, uncomplicated: Secondary | ICD-10-CM | POA: Diagnosis not present

## 2017-02-06 DIAGNOSIS — H0100A Unspecified blepharitis right eye, upper and lower eyelids: Secondary | ICD-10-CM

## 2017-02-06 DIAGNOSIS — E119 Type 2 diabetes mellitus without complications: Secondary | ICD-10-CM | POA: Insufficient documentation

## 2017-02-06 MED ORDER — ERYTHROMYCIN 5 MG/GM OP OINT: 1.0000 "application " | TOPICAL_OINTMENT | Freq: Four times a day (QID) | OPHTHALMIC | 0 refills | Status: DC

## 2017-02-06 MED ORDER — TETRACAINE HCL 0.5 % OP SOLN
2.0000 [drp] | Freq: Once | OPHTHALMIC | Status: AC
  Administered 2017-02-06: 2 [drp] via OPHTHALMIC
  Filled 2017-02-06: qty 4

## 2017-02-06 MED ORDER — GANCICLOVIR 0.15 % OP GEL: 1.0000 [drp] | Freq: Every day | OPHTHALMIC | 0 refills | Status: DC

## 2017-02-06 MED ORDER — FLUORESCEIN SODIUM 0.6 MG OP STRP
2.0000 | ORAL_STRIP | Freq: Once | OPHTHALMIC | Status: AC
  Administered 2017-02-06: 2 via OPHTHALMIC
  Filled 2017-02-06: qty 2

## 2017-02-06 NOTE — Discharge Instructions (Signed)
Conjunctivitis can be very contagious, so avoid contact with others to the affected areas of your eyes. Try to avoid rubbing eyes. Wash eyelids twice daily with warm water and baby shampoo. Throw out all make up products. Apply warm or cool compresses and use prescribed eye drops/ointments as directed. Alternate between tylenol and motrin as needed for pain. Follow up with the ophthalmologist in 1-2 days for recheck of symptoms. Return to the ER for changes or worsening symptoms.

## 2017-02-06 NOTE — ED Triage Notes (Signed)
Pt presents requesting rule out "herpes in the eyes." Rash noted to bilateral eyes; worse on right.

## 2017-02-06 NOTE — ED Provider Notes (Signed)
[facility] DEPT Provider Note   CSN: [id number] Arrival date & time: 02/06/17  1434   By signing my name below, I, Eunice Blase, attest that this documentation has been prepared under the direction and in the presence of [address] [employer]. Electronically signed, Eunice Blase, ED Scribe. 02/06/17. 3:25 PM.   History   Chief Complaint Chief Complaint  Patient presents with  . Eye Pain   The history is provided by the patient and medical records. No language interpreter was used.  Rash   This is a new problem. The current episode started 3 to 5 hours ago. The problem has been gradually worsening. The problem is associated with an unknown factor. There has been no fever. The rash is present on the left eye and right eye. The pain is at a severity of 0/10. The patient is experiencing no pain. The pain has been constant since onset. Associated symptoms include weeping. Pertinent negatives include no blisters, no itching and no pain. She has tried antihistamines (warm compress, allergy relief eye drops ) for the symptoms. The treatment provided no relief. Risk factors include new medications.    Brenda Rosales is a 31 y.o. female with a PMHx of conjunctivitis, eczema, and DM2, who presents to the ED sent from [facility] for evaluation of her eyelid rash. Reports having an erythematous pustular rash around both eyelids, R > L, x 2-3 days. Pt reports associated yellow crusting on the eyelid rash in the morning, watery discharge from the eyes, eye redness, redness to the rash around the eye, and swelling to the eyelids. She notes no pain. Pt seen by her PCP on 01/23/2017 for allergies, including itchy eyes where she was placed on doxycycline for presumed bacterial URI. Pt states she has applied warm compresses to the eyes, used [employer] and applied allergy eye drops with no relief. No known aggravating factors. Has had eczema of the eyelids before, but states this is the "worst"  it's ever seemed. Pt denies itchiness, foreign body sensation, eye pain, visual changes, photophobia, pain with EOM, rhinorrhea, sore throat, ear pain/drainage, fevers, chills, CP, SOB, abd pain, N/V/D/C, hematuria, dysuria, myalgias, arthralgias, numbness, tingling, focal weakness, or any other complaints at this time. No sick contacts noted. Has not used eye makeup this week, although does occasionally wear make up. No recent swimming. No contact lens use.   Past Medical History:  Diagnosis Date  . Allergy   . Anxiety   . ASTHMA 11/19/2007  . CHICKENPOX 11/19/2007  . Diabetes mellitus without complication (HCC)    diet controlled  . Duodenitis   . Dysmenorrhea 11/19/2007  . DYSPHAGIA 11/19/2010  . ECZEMA 11/19/2007  . Esophageal stricture   . Esophagitis   . FATTY LIVER DISEASE 04/17/2008  . GERD 11/20/2010  . Hyperlipidemia   . IBS (irritable bowel syndrome)   . Infection of eyelid 08/07/2011  . Other and unspecified noninfectious gastroenteritis and colitis(558.9) 02/2014  . RHINOSINUSITIS, CHRONIC 12/09/2007  . TB SKIN TEST, POSITIVE 11/19/2007    Patient Active Problem List   Diagnosis Date Noted  . Diabetes mellitus without complication ([place]) 53/66/4403  . Hypertriglyceridemia 04/23/2016  . Allergic rhinitis 03/01/2015  . GERD 11/20/2010  . FATTY LIVER DISEASE 04/17/2008  . Asthma, persistent controlled 11/19/2007    Past Surgical History:  Procedure Laterality Date  . CT SINUS LTD W/O CM  09/23/07  . Mole excision     x's 4    OB History    Gravida  Para Term Preterm AB Living   0 0 0 0   0   SAB TAB Ectopic Multiple Live Births                   Home Medications    Prior to Admission medications   Medication Sig Start Date End Date Taking? Authorizing Provider  albuterol (PROVENTIL HFA;VENTOLIN HFA) 108 (90 BASE) MCG/ACT inhaler Inhale 2 puffs into the lungs every 6 (six) hours as needed for wheezing or shortness of breath. 02/21/14   Biagio Borg, MD  doxycycline  (VIBRA-TABS) 100 MG tablet Take 1 tablet (100 mg total) by mouth 2 (two) times daily. 01/23/17   Renee A Kuneff, DO  EPINEPHrine 0.3 mg/0.3 mL IJ SOAJ injection Inject 0.3 mg into the muscle once.    Historical Provider, MD  levocetirizine (XYZAL) 5 MG tablet Take 1 tablet (5 mg total) by mouth every evening. 01/23/17   Renee A Kuneff, DO  metFORMIN (GLUCOPHAGE) 1000 MG tablet Take 1 tablet (1,000 mg total) by mouth 2 (two) times daily with a meal. 11/06/16   Renee A Kuneff, DO  norethindrone-ethinyl estradiol (BALZIVA) 0.4-35 MG-MCG tablet Take 1 tablet by mouth daily. 02/20/16   [place], MD  predniSONE (DELTASONE) 50 MG tablet Take 1 tablet (50 mg total) by mouth daily with breakfast. 01/23/17   Renee A Kuneff, DO  Spacer/Aero-Holding Chambers (AEROCHAMBER MV) inhaler  04/10/16   Historical Provider, MD  SYMBICORT 160-4.5 MCG/ACT inhaler Inhale 2 puffs into the lungs 2 (two) times daily. 11/13/16   Historical Provider, MD  tacrolimus (PROTOPIC) 0.1 % ointment Apply topically 2 (two) times daily. Patient taking differently: Apply 1 application topically 2 (two) times daily as needed (for rash, skin issues).  06/22/14   Roselee Culver, MD  traMADol (ULTRAM) 50 MG tablet Take 1 tablet (50 mg total) by mouth every 6 (six) hours as needed. 11/15/16   Nona Dell, PA-C    Family History Family History  Problem Relation Age of Onset  . Adopted: Yes    Social History Social History  Substance Use Topics  . Smoking status: Never Smoker  . Smokeless tobacco: Never Used  . Alcohol use 0.6 oz/week    1 Standard drinks or equivalent per week     Comment: SOCIALLY     Allergies   Vancomycin   Review of Systems Review of Systems  Constitutional: Negative for chills and fever.  HENT: Negative for ear discharge, ear pain, rhinorrhea and sore throat.   Eyes: Positive for discharge (from rash around eyes) and redness. Negative for photophobia, pain, itching and visual  disturbance.  Respiratory: Negative for shortness of breath.   Cardiovascular: Negative for chest pain.  Gastrointestinal: Negative for abdominal pain, constipation, diarrhea, nausea and vomiting.  Genitourinary: Negative for dysuria and hematuria.  Musculoskeletal: Negative for arthralgias and myalgias.  Skin: Positive for color change and rash. Negative for itching and wound.  Allergic/Immunologic: Positive for immunocompromised state (DM2).  Neurological: Negative for weakness and numbness.  Psychiatric/Behavioral: Negative for confusion.   10 Systems reviewed and all are negative for acute change except as noted in the HPI.    Physical Exam Updated Vital Signs BP (!) 132/96 (BP Location: Left Arm)   Pulse (!) 57   Temp 98.8 F (37.1 C) (Oral)   Resp 16   Ht 5\' 3"  (1.6 m)   Wt 141 lb 3.2 oz (64 kg)   LMP 01/30/2017   SpO2 99%  BMI 25.01 kg/m   Physical Exam  Constitutional: She is oriented to person, place, and time. Vital signs are normal. She appears well-developed and well-nourished.  Non-toxic appearance. No distress.  Afebrile, nontoxic, NAD  HENT:  Head: Normocephalic and atraumatic.  Mouth/Throat: Mucous membranes are normal.  Eyes: EOM are normal. Pupils are equal, round, and reactive to light. Lids are everted and swept, no foreign bodies found. Right eye exhibits no discharge. No foreign body present in the right eye. Left eye exhibits no discharge. No foreign body present in the left eye. Right conjunctiva is injected. Right conjunctiva has no hemorrhage. Left conjunctiva is injected. Left conjunctiva has no hemorrhage.  Slit lamp exam:      The right eye shows no corneal abrasion, no corneal flare, no corneal ulcer, no foreign body, no hyphema, no hypopyon and no fluorescein uptake.       The left eye shows no corneal abrasion, no corneal flare, no corneal ulcer, no foreign body, no hyphema, no hypopyon and no fluorescein uptake.  PERRL, EOMI, no nystagmus, no  visual field deficits  Mild conjunctival injection to both eyes, no occular drainage, mild swelling and erythematous pustular rash to R upper and lower eyelids as well as slightly on the L medial canthus area, mild honey-colored crusting noted to the rash, no vesicules noted, no drainage or warmth appreciated. SEE PICTURE BELOW. No pain with EOM, no TTP to periorbital region. Fluorescein stain reveals no dendritic lesions, no uptake, no ulcerations or flares, no FBs seen, no hyphema or hypopyon noted.  Neck: Normal range of motion. Neck supple.  Cardiovascular: Normal rate and intact distal pulses.   Pulmonary/Chest: Effort normal. No respiratory distress.  Abdominal: Normal appearance. She exhibits no distension.  Musculoskeletal: Normal range of motion.  Neurological: She is alert and oriented to person, place, and time. She has normal strength. No sensory deficit.  Skin: Skin is warm, dry and intact. Rash noted.  Rash to eyes as noted above and pictured below  Psychiatric: She has a normal mood and affect. Her behavior is normal.  Nursing note and vitals reviewed.      ED Treatments / Results  DIAGNOSTIC STUDIES: Oxygen Saturation is 99% on RA, normal by my interpretation.    COORDINATION OF CARE: 3:00 PM Discussed treatment plan with pt at bedside and pt agreed to plan. Will consult attending physician and perform eye exam.  Labs (all labs ordered are listed, but only abnormal results are displayed) Labs Reviewed - No data to display  EKG  EKG Interpretation None       Radiology No results found.  Procedures Procedures (including critical care time)  Medications Ordered in ED Medications  fluorescein ophthalmic strip 2 strip (2 strips Both Eyes Given 02/06/17 1509)  tetracaine (PONTOCAINE) 0.5 % ophthalmic solution 2 drop (2 drops Both Eyes Given 02/06/17 1509)     Initial Impression / Assessment and Plan / ED Course  I have reviewed the triage vital signs and the  nursing notes.  Pertinent labs & imaging results that were available during my care of the patient were reviewed by me and considered in my medical decision making (see chart for details).     31 y.o. female here with rash around both eyes R>L x2 days, was told to come to r/o herpetic conjunctivitis. On exam, honey colored crusted rash with slight pustular appearance around R eye, upper and lower eyelids, and on L eye medial canthus; PERRL, EOMI, no pain with EOM, mild  swelling of eyelids but no orbital or preseptal cellulitis appearance. Fluorescein stain reveals no dendrites or ulcerations/abrasions. Doubt herpetic conjunctivitis, but will treat empirically with erythromycin ointment for eyes and applied to rash, as well as gangiclovir to empirically cover for viral infection. Discussed throwing out make up brushes, and washing eyelids. Warm/cold compresses advised. F/up with ophthalmology in 1-2 days for recheck. Strict return guidelines advised. Discussed case with my attending Dr. Wilson Singer who agrees with plan. I explained the diagnosis and have given explicit precautions to return to the ER including for any other new or worsening symptoms. The patient understands and accepts the medical plan as it's been dictated and I have answered their questions. Discharge instructions concerning home care and prescriptions have been given. The patient is STABLE and is discharged to home in good condition.   I personally performed the services described in this documentation, which was scribed in my presence. The recorded information has been reviewed and is accurate.   Final Clinical Impressions(s) / ED Diagnoses   Final diagnoses:  Blepharitis of upper and lower eyelids of both eyes, unspecified type  Acute conjunctivitis of both eyes, unspecified acute conjunctivitis type  Impetigo of eyelid    New Prescriptions New Prescriptions   ERYTHROMYCIN OPHTHALMIC OINTMENT    Place 1 application into both eyes 4  (four) times daily. Apply thin 1/2 inch ribbon to lower eyelid every 6 hours x 7 days   GANCICLOVIR (ZIRGAN) 0.15 % GEL    Place 1 drop into both eyes 5 (five) times daily.     [address], PA-C 02/06/17 [place], MD 02/07/17 [phone]

## 2017-02-06 NOTE — ED Notes (Signed)
PT DISCHARGED. INSTRUCTIONS AND PRESCRIPTIONS GIVEN. AAOX4. PT IN NO APPARENT DISTRESS OR PAIN. THE OPPORTUNITY TO ASK QUESTIONS WAS PROVIDED. 

## 2017-02-09 LAB — HM DIABETES EYE EXAM

## 2017-02-10 ENCOUNTER — Encounter: Payer: Self-pay | Admitting: *Deleted

## 2017-02-10 ENCOUNTER — Encounter: Payer: Self-pay | Admitting: Family Medicine

## 2017-02-10 ENCOUNTER — Ambulatory Visit (INDEPENDENT_AMBULATORY_CARE_PROVIDER_SITE_OTHER): Payer: BC Managed Care – PPO | Admitting: Family Medicine

## 2017-02-10 VITALS — BP 100/68 | HR 81 | Temp 98.8°F | Resp 20 | Wt 140.0 lb

## 2017-02-10 DIAGNOSIS — E119 Type 2 diabetes mellitus without complications: Secondary | ICD-10-CM | POA: Diagnosis not present

## 2017-02-10 LAB — POCT GLYCOSYLATED HEMOGLOBIN (HGB A1C): HEMOGLOBIN A1C: 6.6

## 2017-02-10 NOTE — Patient Instructions (Signed)
Good luck on your 5K!!! See you in 3 months. Keep up the good work.    A1c: 6.6 today--> Goal 6.0

## 2017-02-10 NOTE — Progress Notes (Signed)
Patient ID: Brenda Rosales, female   DOB: [birth year], 31 y.o.   MRN: [id number]      Patient ID: Brenda Rosales, female  DOB: 12/21/1985, 31 y.o.   MRN: [id number]  Subjective:  Brenda Rosales is a 31 y.o. female present for follow up on uncontrolled diabetes.  Patient Care Team    Relationship Specialty Notifications Start End  Ma Hillock, DO PCP - General Family Medicine  04/23/16    Comment: Patient request  Nunzio Cobbs, MD Consulting Physician Obstetrics and Gynecology  04/23/16   Deneise Lever, MD Consulting Physician Pulmonary Disease  04/23/16   Irene Shipper, MD Consulting Physician Gastroenterology  04/23/16   Danice Goltz, MD Consulting Physician Ophthalmology  02/10/17    Diabetes:  Patient presents for follow-up on her diabetes type 2. She reports compliance with 1000 mg twice a day of metformin, and is doing better about remembering. She continues to exercise and is training for her first 5K. She completed the whole 30 diet, and since has been trying to stick to the food options.  She has not desired/refused to monitor her blood glucose daily. She was referred to nutrition but did not attend. Denies numbness, tingling of extremities, hypo/hyperglycemic events or non-healing wounds.  Prior note: patient present for follow up on her newly re-diagnosed uncontrolled diabetes. She was seen about 5 months ago and started on metformin taper after an 8.7 of a1c. She did not start her metformin. She states she wanted to try diet and exercise first because that is how she did it before and never took medicine.  She voiced last OV she did not think she needed medicine. She endorses cardio exercise 3x a week for 30 minutes and decreasing her carbs/sugar content in meals. She has been diagnosed with an a1c 11% in 2012, and never took medications. She was agreeable to fish oil supplement (triglycerides > 500) and nutrition/diabetes education, referrals were made, but she  did not attend. She states it would have cost her > $300. She denies hyper/hypoglycemic events, neuropathy or nonhealing wounds. - Urine Microalbumin w/creat. Ratio 04/23/2016: WNL - eye exam: 08/2015--> requesting records [facility] in [place], recommnend yearly  - foot exam completed 07/31/2016 - flu shot UTD - PPSV 23 2008, prevnar today - A1c 8.7--> 8.6--> 6.2  (11/06/2016)   Recent Results (from the past 2160 hour(s))  POCT glycosylated hemoglobin (Hb A1C)     Status: Abnormal   Collection Time: 02/10/17 10:16 AM  Result Value Ref Range   Hemoglobin A1C 6.6      Immunization History  Administered Date(s) Administered  . Influenza Whole 08/19/2009  . Influenza,inj,Quad PF,36+ Mos 07/31/2016  . Influenza-Unspecified 08/11/2015  . Pneumococcal Conjugate-13 11/06/2016  . Pneumococcal Polysaccharide-23 07/23/2007  . Td 09/11/2004  . Tdap 07/04/2012     Past Medical History:  Diagnosis Date  . Allergy   . Anxiety   . ASTHMA 11/19/2007  . CHICKENPOX 11/19/2007  . Diabetes mellitus without complication (HCC)    diet controlled  . Duodenitis   . Dysmenorrhea 11/19/2007  . DYSPHAGIA 11/19/2010  . ECZEMA 11/19/2007  . Esophageal stricture   . Esophagitis   . FATTY LIVER DISEASE 04/17/2008  . GERD 11/20/2010  . Hyperlipidemia   . IBS (irritable bowel syndrome)   . Infection of eyelid 08/07/2011  . Other and unspecified noninfectious gastroenteritis and colitis(558.9) 02/2014  . RHINOSINUSITIS, CHRONIC 12/09/2007  . TB SKIN TEST, POSITIVE 11/19/2007   Allergies  Allergen  Reactions  . Vancomycin Other (See Comments)    redmans syndrome    Past Surgical History:  Procedure Laterality Date  . CT SINUS LTD W/O CM  09/23/07  . Mole excision     x's 4   Family History  Problem Relation Age of Onset  . Adopted: Yes   Social History   Social History  . Marital status: Single    Spouse name: N/A  . Number of children: 0  . Years of education: N/A   Occupational History  .  Teacher [employer]   Social History Main Topics  . Smoking status: Never Smoker  . Smokeless tobacco: Never Used  . Alcohol use 0.6 oz/week    1 Standard drinks or equivalent per week     Comment: SOCIALLY  . Drug use: No  . Sexual activity: Yes    Partners: Male    Birth control/ protection: Pill     Comment: Hulda Humphrey   Other Topics Concern  . Not on file   Social History Narrative   Adopted from [place].    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   Drinks caffeine, uses herbal remedies   Wears her seatbelt, wears bicycle helmet   Reports routine exercise    Smoke detector in the home.   Feels safe in relationships.       ROS: Negative, with the exception of above mentioned in HPI  Objective: BP 100/68 (BP Location: Right Arm, Patient Position: Sitting, Cuff Size: Normal)   Pulse 81   Temp 98.8 F (37.1 C)   Resp 20   Wt 140 lb (63.5 kg)   LMP 01/30/2017   SpO2 98%   BMI 24.80 kg/m  Gen: Afebrile. No acute distress.  HENT: AT. [place]. MMM.  Eyes:Pupils Equal Round Reactive to light, Extraocular movements intact,  Conjunctiva without redness, discharge or icterus. Impetigo crusted rash surrounding bilateral eyes (being treated).  CV: RRR, no edema, +2/4 P posterior tibialis pulses Chest: CTAB, no wheeze or crackles Abd: Soft. NTND. BS present.   Neuro:  Normal gait. PERLA. EOMi. Alert. Oriented.    Results for orders placed or performed in visit on 02/10/17 (from the past 24 hour(s))  POCT glycosylated hemoglobin (Hb A1C)     Status: Abnormal   Collection Time: 02/10/17 10:16 AM  Result Value Ref Range   Hemoglobin A1C 6.6     Assessment/plan: Brenda Rosales is a 31 y.o. female present for Diabetes follow-up Diabetes mellitus without complication ([place]) - Urine Microalbumin w/creat. Ratio 04/23/2016: WNL. Follow yearly - eye exam: now with DR. Manuella Ghazi, exam yesterday. - foot exam completed 07/31/2016. Follow  yearly. Foot care explained to patient. - flu shot UTD 2017 - PPSV 23 2008, prevnar 10/2016 - A1c 8.7--> 8.6--> 6.2--> 6.6  Today. Continue current regimen. Refills not needed today. Mild elevation possibly secondary to acute illness x2 and prednisone use.  -  Continue diet and exercise modifications. Sh eis training for a 5K in May.  - Follow-up every 3 months  Return in about 3 months (around 05/12/2017), or diabetes.   Electronically signed by: Howard Pouch, DO [place]

## 2017-02-20 ENCOUNTER — Encounter: Payer: Self-pay | Admitting: Obstetrics and Gynecology

## 2017-02-20 ENCOUNTER — Ambulatory Visit (INDEPENDENT_AMBULATORY_CARE_PROVIDER_SITE_OTHER): Payer: BC Managed Care – PPO | Admitting: Obstetrics and Gynecology

## 2017-02-20 VITALS — BP 118/70 | HR 64 | Resp 16 | Ht 63.25 in | Wt 147.0 lb

## 2017-02-20 DIAGNOSIS — Z3041 Encounter for surveillance of contraceptive pills: Secondary | ICD-10-CM

## 2017-02-20 DIAGNOSIS — Z113 Encounter for screening for infections with a predominantly sexual mode of transmission: Secondary | ICD-10-CM

## 2017-02-20 DIAGNOSIS — E559 Vitamin D deficiency, unspecified: Secondary | ICD-10-CM

## 2017-02-20 DIAGNOSIS — Z01419 Encounter for gynecological examination (general) (routine) without abnormal findings: Secondary | ICD-10-CM | POA: Diagnosis not present

## 2017-02-20 LAB — COMPREHENSIVE METABOLIC PANEL
ALK PHOS: 46 U/L (ref 33–115)
ALT: 13 U/L (ref 6–29)
AST: 17 U/L (ref 10–30)
Albumin: 4.3 g/dL (ref 3.6–5.1)
BUN: 11 mg/dL (ref 7–25)
CHLORIDE: 104 mmol/L (ref 98–110)
CO2: 23 mmol/L (ref 20–31)
CREATININE: 0.68 mg/dL (ref 0.50–1.10)
Calcium: 9.4 mg/dL (ref 8.6–10.2)
Glucose, Bld: 122 mg/dL — ABNORMAL HIGH (ref 65–99)
Potassium: 4.1 mmol/L (ref 3.5–5.3)
SODIUM: 136 mmol/L (ref 135–146)
TOTAL PROTEIN: 7.4 g/dL (ref 6.1–8.1)
Total Bilirubin: 0.6 mg/dL (ref 0.2–1.2)

## 2017-02-20 LAB — LIPID PANEL
Cholesterol: 231 mg/dL — ABNORMAL HIGH (ref <200)
HDL: 63 mg/dL (ref >50)
LDL Cholesterol: 146 mg/dL — ABNORMAL HIGH (ref <100)
Total CHOL/HDL Ratio: 3.7 Ratio (ref <5.0)
Triglycerides: 109 mg/dL (ref <150)
VLDL: 22 mg/dL (ref <30)

## 2017-02-20 MED ORDER — NORETHINDRONE-ETH ESTRADIOL 0.4-35 MG-MCG PO TABS: 1.0000 | ORAL_TABLET | Freq: Every day | ORAL | 3 refills | Status: DC

## 2017-02-20 NOTE — Progress Notes (Signed)
31 y.o. G0P0000 Single Hispanic female here for annual exam.    New partner.  Wants STD testing.   HgbA1C is 6.6.  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  PCP:   Howard Pouch, MD  Patient's last menstrual period was 02/02/2017.           Sexually active: Yes.    The current method of family planning is OCP (estrogen/progesterone).    Exercising: Yes.    cardio/weights/yoga Smoker:  no  Health Maintenance: Pap:02-21-16 Neg:Neg HR HPV; 02-08-15 Neg History of abnormal Pap:  Yes, abnormal pap 2011 with colposcopy but no treatment to cervix.  MMG: n/a Colonoscopy: 04/2008 for abdominal pain with Dr. Perry:normal. BMD:   n/a  Result  n/a TDaP:  2013 Gardasil:   Done years ago; completed series per patient HIV:12/08/12 -- negative Hep C: 06/17/13 -- negative Screening Labs:  ---   reports that she has never smoked. She has never used smokeless tobacco. She reports that she drinks about 0.6 oz of alcohol per week . She reports that she does not use drugs.  Past Medical History:  Diagnosis Date  . Allergy   . Anxiety   . ASTHMA 11/19/2007  . CHICKENPOX 11/19/2007  . Diabetes mellitus without complication (HCC)    diet controlled  . Duodenitis   . Dysmenorrhea 11/19/2007  . DYSPHAGIA 11/19/2010  . ECZEMA 11/19/2007  . Esophageal stricture   . Esophagitis   . FATTY LIVER DISEASE 04/17/2008  . GERD 11/20/2010  . Hyperlipidemia   . IBS (irritable bowel syndrome)   . Infection of eyelid 08/07/2011  . Other and unspecified noninfectious gastroenteritis and colitis(558.9) 02/2014  . RHINOSINUSITIS, CHRONIC 12/09/2007  . TB SKIN TEST, POSITIVE 11/19/2007    Past Surgical History:  Procedure Laterality Date  . CT SINUS LTD W/O CM  09/23/07  . Mole excision     x's 4    Current Outpatient Prescriptions  Medication Sig Dispense Refill  . albuterol (PROVENTIL HFA;VENTOLIN HFA) 108 (90 BASE) MCG/ACT inhaler Inhale 2 puffs into the lungs every 6 (six) hours as needed for  wheezing or shortness of breath. 1 Inhaler 11  . EPINEPHrine 0.3 mg/0.3 mL IJ SOAJ injection Inject 0.3 mg into the muscle once.    Marland Kitchen erythromycin ophthalmic ointment     . levocetirizine (XYZAL) 5 MG tablet Take 1 tablet (5 mg total) by mouth every evening. 90 tablet 3  . metFORMIN (GLUCOPHAGE) 1000 MG tablet Take 1 tablet (1,000 mg total) by mouth 2 (two) times daily with a meal. 180 tablet 1  . norethindrone-ethinyl estradiol (BALZIVA) 0.4-35 MG-MCG tablet Take 1 tablet by mouth daily. 3 Package 3  . SYMBICORT 160-4.5 MCG/ACT inhaler Inhale 2 puffs into the lungs 2 (two) times daily.  3  . tacrolimus (PROTOPIC) 0.1 % ointment Apply topically 2 (two) times daily. (Patient taking differently: Apply 1 application topically 2 (two) times daily as needed (for rash, skin issues). ) 100 g 1   No current facility-administered medications for this visit.     Family History  Problem Relation Age of Onset  . Adopted: Yes    ROS:  Pertinent items are noted in HPI.  Otherwise, a comprehensive ROS was negative.  Exam:   BP 118/70 (BP Location: Right Arm, Patient Position: Sitting, Cuff Size: Normal)   Pulse 64   Resp 16   Ht 5' 3.25" (1.607 m)   Wt 147 lb (66.7 kg)   LMP 02/02/2017  BMI 25.83 kg/m     General appearance: alert, cooperative and appears stated age Head: Normocephalic, without obvious abnormality, atraumatic Neck: no adenopathy, supple, symmetrical, trachea midline and thyroid normal to inspection and palpation Lungs: clear to auscultation bilaterally Breasts: normal appearance, no masses or tenderness, No nipple retraction or dimpling, No nipple discharge or bleeding, No axillary or supraclavicular adenopathy Heart: regular rate and rhythm Abdomen: soft, non-tender; no masses, no organomegaly Extremities: extremities normal, atraumatic, no cyanosis or edema Skin: Skin color, texture, turgor normal. No rashes or lesions Lymph nodes: Cervical, supraclavicular, and axillary  nodes normal. No abnormal inguinal nodes palpated Neurologic: Grossly normal  Pelvic: External genitalia:  no lesions              Urethra:  normal appearing urethra with no masses, tenderness or lesions              Bartholins and Skenes: normal                 Vagina: normal appearing vagina with normal color and discharge, no lesions              Cervix: no lesions              Pap taken: No. Bimanual Exam:  Uterus:  normal size, contour, position, consistency, mobility, non-tender              Adnexa: no mass, fullness, tenderness           Chaperone was present for exam.  Assessment:   Well woman visit with normal exam. DM.  Desire for STD testing.   Plan: Mammogram screening discussed. Recommended self breast awareness. Pap and HR HPV as above. Guidelines for Calcium, Vitamin D, regular exercise program including cardiovascular and weight bearing exercise. Routine labs and STD screening.  Refill of OCPs for one year.  Follow up annually and prn.    After visit summary provided.

## 2017-02-20 NOTE — Patient Instructions (Signed)

## 2017-02-21 LAB — VITAMIN D 25 HYDROXY (VIT D DEFICIENCY, FRACTURES): Vit D, 25-Hydroxy: 16 ng/mL — ABNORMAL LOW (ref 30–100)

## 2017-02-21 LAB — STD PANEL
HIV: NONREACTIVE
Hepatitis B Surface Ag: NEGATIVE

## 2017-02-21 LAB — HEPATITIS C ANTIBODY: HCV Ab: NEGATIVE

## 2017-02-21 LAB — TSH: TSH: 3.07 mIU/L

## 2017-02-23 LAB — GC/CHLAMYDIA PROBE AMP
CT PROBE, AMP APTIMA: NOT DETECTED
GC PROBE AMP APTIMA: NOT DETECTED

## 2017-02-25 ENCOUNTER — Encounter: Payer: Self-pay | Admitting: Obstetrics and Gynecology

## 2017-02-25 NOTE — Addendum Note (Signed)
Addended by: Yisroel Ramming, Dietrich Pates E on: 02/25/2017 07:40 PM   Modules accepted: Orders

## 2017-02-26 ENCOUNTER — Telehealth: Payer: Self-pay | Admitting: *Deleted

## 2017-02-26 NOTE — Telephone Encounter (Signed)
-----   Message from Nunzio Cobbs, MD sent at 02/25/2017  7:40 PM EDT ----- Please report lab results to patient.  Her vitamin D level is low, and I am recommending vitamin D 50,000 IU weekly for 3 months.  Please send to pharmacy of choice and schedule a lab recheck.  I will place a future order.   Her glucose was elevated at 122. She is treated with Metformin by her PCP. Her cholesterol was elevated at 231, and specifically the LDL cholesterol was up.  She can reduce this through vigorous exercise and a low cholesterol diet.   She has made a lot of improvement in the cholesterol panel over the years!  Thyroid testing is normal.   Her testing for infection is negative for HIV, syphilis, hepatitis B and C, and gonorrhea and chlamydia.

## 2017-02-26 NOTE — Telephone Encounter (Signed)
Left message to call Andersen Iorio at [phone].  

## 2017-03-03 NOTE — Telephone Encounter (Signed)
Spoke with patient, advised calling to review recent lab results. 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 Advised to return call to [place] at [phone].

## 2017-03-09 MED ORDER — VITAMIN D (ERGOCALCIFEROL) 1.25 MG (50000 UNIT) PO CAPS: 50000.0000 [IU] | ORAL_CAPSULE | ORAL | 0 refills | Status: DC

## 2017-03-09 NOTE — Telephone Encounter (Signed)
Return call to Jill H. °

## 2017-03-09 NOTE — Telephone Encounter (Signed)
Left message to call Zanya Lindo at [phone].  

## 2017-03-09 NOTE — Telephone Encounter (Signed)
Spoke with patient, advised of results and recommendations as seen below per Dr. Quincy Simmonds. Patient scheduled for vitamin D lab on 06/11/17 at 10am. Rx for Vit D to verified pharmacy on file. Patient verbalizes understanding and is agreeable.  Routing to provider for final review. Patient is agreeable to disposition. Will close encounter.

## 2017-04-22 ENCOUNTER — Other Ambulatory Visit: Payer: Self-pay | Admitting: Obstetrics and Gynecology

## 2017-04-22 DIAGNOSIS — Z3041 Encounter for surveillance of contraceptive pills: Secondary | ICD-10-CM

## 2017-05-12 ENCOUNTER — Encounter: Payer: Self-pay | Admitting: Family Medicine

## 2017-05-12 ENCOUNTER — Ambulatory Visit (INDEPENDENT_AMBULATORY_CARE_PROVIDER_SITE_OTHER): Payer: BC Managed Care – PPO | Admitting: Family Medicine

## 2017-05-12 VITALS — BP 101/64 | HR 66 | Temp 98.3°F | Resp 20 | Ht 63.0 in | Wt 150.0 lb

## 2017-05-12 DIAGNOSIS — E119 Type 2 diabetes mellitus without complications: Secondary | ICD-10-CM

## 2017-05-12 LAB — POCT GLYCOSYLATED HEMOGLOBIN (HGB A1C): Hemoglobin A1C: 6

## 2017-05-12 MED ORDER — METFORMIN HCL 1000 MG PO TABS: 1000.0000 mg | ORAL_TABLET | Freq: Two times a day (BID) | ORAL | 1 refills | Status: DC

## 2017-05-12 NOTE — Patient Instructions (Signed)
Great Job! Keep up the good work.  See you in 3-4 months.    A1c 6.0 today!

## 2017-05-12 NOTE — Progress Notes (Signed)
Patient ID: Brenda Rosales, female   DOB: [birth year], 31 y.o.   MRN: [id number]      Patient ID: Brenda Rosales, female  DOB: [birth year], 31 y.o.   MRN: [id number]  Subjective:  Brenda Rosales is a 31 y.o. female present for follow up on uncontrolled diabetes.  Patient Care Team    Relationship Specialty Notifications Start End  Ma Hillock, DO PCP - General Family Medicine  04/23/16    Comment: Patient request  Nunzio Cobbs, MD Consulting Physician Obstetrics and Gynecology  04/23/16   Deneise Lever, MD Consulting Physician Pulmonary Disease  04/23/16   Irene Shipper, MD Consulting Physician Gastroenterology  04/23/16   Danice Goltz, MD Consulting Physician Ophthalmology  02/10/17    Diabetes:  Pt reports compliance with metformin 1000 BID. She is toleratin med. Denies numbness, tingling of extremities, hypo/hyperglycemic events or non-healing wounds. Pt reports BG ranges doe snot desire to check.  PNA series: Pna23 due in 10/2017. Prevanr completed 10/2016 Flu shot: utd 2017 (recommneded yearly) BMP: UTD 02/19/2017. WNL Foot exam: completed today Eye exam: Dr. Manuella Ghazi, UTD 02/2017 A1c: 6.0 today!   Prior note: patient present for follow up on her newly re-diagnosed uncontrolled diabetes. She was seen about 5 months ago and started on metformin taper after an 8.7 of a1c. She did not start her metformin. She states she wanted to try diet and exercise first because that is how she did it before and never took medicine.  She voiced last OV she did not think she needed medicine. She endorses cardio exercise 3x a week for 30 minutes and decreasing her carbs/sugar content in meals. She has been diagnosed with an a1c 11% in 2012, and never took medications. She was agreeable to fish oil supplement (triglycerides > 500) and nutrition/diabetes education, referrals were made, but she did not attend. She states it would have cost her > $300. She denies hyper/hypoglycemic  events, neuropathy or nonhealing wounds. - Urine Microalbumin w/creat. Ratio 04/23/2016: WNL - eye exam: 08/2015--> requesting records [facility] in [place], recommnend yearly  - foot exam completed 07/31/2016 - flu shot UTD - PPSV 23 2008, prevnar today - A1c 8.7--> 8.6--> 6.2  (11/06/2016)   Recent Results (from the past 2160 hour(s))  GC/Chlamydia Probe Amp     Status: None   Collection Time: 02/20/17  4:54 PM  Result Value Ref Range   CT Probe RNA NOT DETECTED     Comment:                    **Normal Reference Range: NOT DETECTED**   This test was performed using the APTIMA COMBO2 Assay ([place].).   The analytical performance characteristics of this assay, when used to test SurePath specimens have been determined by [facility]      GC Probe RNA NOT DETECTED     Comment:                    **Normal Reference Range: NOT DETECTED**   This test was performed using the APTIMA COMBO2 Assay ([place].).   The analytical performance characteristics of this assay, when used to test SurePath specimens have been determined by [facility]     Lipid panel     Status: Abnormal   Collection Time: 02/20/17  5:18 PM  Result Value Ref Range   Cholesterol 231 (H) <200 mg/dL   Triglycerides 109 <150 mg/dL   HDL 63 >50 mg/dL  Total CHOL/HDL Ratio 3.7 <5.0 Ratio   VLDL 22 <30 mg/dL   LDL Cholesterol 146 (H) <100 mg/dL  Comprehensive metabolic panel     Status: Abnormal   Collection Time: 02/20/17  5:18 PM  Result Value Ref Range   Sodium 136 135 - 146 mmol/L   Potassium 4.1 3.5 - 5.3 mmol/L   Chloride 104 98 - 110 mmol/L   CO2 23 20 - 31 mmol/L   Glucose, Bld 122 (H) 65 - 99 mg/dL   BUN 11 7 - 25 mg/dL   Creat 0.68 0.50 - 1.10 mg/dL   Total Bilirubin 0.6 0.2 - 1.2 mg/dL   Alkaline Phosphatase 46 33 - 115 U/L   AST 17 10 - 30 U/L   ALT 13 6 - 29 U/L   Total Protein 7.4 6.1 - 8.1 g/dL   Albumin 4.3 3.6 - 5.1 g/dL   Calcium 9.4 8.6 - 10.2 mg/dL  TSH      Status: None   Collection Time: 02/20/17  5:18 PM  Result Value Ref Range   TSH 3.07 mIU/L    Comment:   Reference Range   > or = 20 Years  0.40-4.50   Pregnancy Range First trimester  0.26-2.66 Second trimester 0.55-2.73 Third trimester  0.43-2.91     VITAMIN D 25 Hydroxy (Vit-D Deficiency, Fractures)     Status: Abnormal   Collection Time: 02/20/17  5:18 PM  Result Value Ref Range   Vit D, 25-Hydroxy 16 (L) 30 - 100 ng/mL    Comment: Vitamin D Status           25-OH Vitamin D        Deficiency                <20 ng/mL        Insufficiency         20 - 29 ng/mL        Optimal             > or = 30 ng/mL   For 25-OH Vitamin D testing on patients on D2-supplementation and patients for whom quantitation of D2 and D3 fractions is required, the [facility] 25-OH VIT D, (D2,D3), LC/MS/MS is recommended: order code [id number] (patients > 2 yrs).   Hepatitis C antibody     Status: None   Collection Time: 02/20/17  5:18 PM  Result Value Ref Range   HCV Ab NEGATIVE NEGATIVE  STD Panel (HBSAG,HIV,RPR)     Status: None   Collection Time: 02/20/17  5:18 PM  Result Value Ref Range   Hepatitis B Surface Ag NEGATIVE NEGATIVE   HIV 1&2 Ab, 4th Generation NONREACTIVE NONREACTIVE    Comment:   HIV-1 antigen and HIV-1/HIV-2 antibodies were not detected.  There is no laboratory evidence of HIV infection.   HIV-1/2 Antibody Diff        Not indicated. HIV-1 RNA, Qual TMA          Not indicated.      RPR Ser Ql NON REAC NON REAC    Comment:   PLEASE NOTE: This information has been disclosed to you from records whose confidentiality may be protected by state law. If your state requires such protection, then the state law prohibits you from making any further disclosure of the information without the specific written consent of the person to whom it pertains, or as otherwise permitted by law. A general authorization for the release of medical or other information is NOT sufficient  for this  purpose.   The performance of this assay has not been clinically validated in patients less than 7 years old.   For additional information please refer to http://education.[facility].com/faq/FAQ106.  (This link is being provided for informational/educational purposes only.)     POCT glycosylated hemoglobin (Hb A1C)     Status: Normal   Collection Time: 05/12/17  9:47 AM  Result Value Ref Range   Hemoglobin A1C 6.0      Immunization History  Administered Date(s) Administered  . Influenza Whole 08/19/2009  . Influenza,inj,Quad PF,36+ Mos 07/31/2016  . Influenza-Unspecified 08/11/2015  . Pneumococcal Conjugate-13 11/06/2016  . Pneumococcal Polysaccharide-23 07/23/2007  . Td 09/11/2004  . Tdap 07/04/2012     Past Medical History:  Diagnosis Date  . Allergy   . Anxiety   . ASTHMA 11/19/2007  . CHICKENPOX 11/19/2007  . Diabetes mellitus without complication (HCC)    diet controlled  . Duodenitis   . Dysmenorrhea 11/19/2007  . DYSPHAGIA 11/19/2010  . ECZEMA 11/19/2007  . Esophageal stricture   . Esophagitis   . FATTY LIVER DISEASE 04/17/2008  . GERD 11/20/2010  . Hyperlipidemia   . IBS (irritable bowel syndrome)   . Infection of eyelid 08/07/2011  . Low vitamin D level 2018  . Other and unspecified noninfectious gastroenteritis and colitis(558.9) 02/2014  . RHINOSINUSITIS, CHRONIC 12/09/2007  . TB SKIN TEST, POSITIVE 11/19/2007   Allergies  Allergen Reactions  . Vancomycin Other (See Comments)    redmans syndrome    Past Surgical History:  Procedure Laterality Date  . CT SINUS LTD W/O CM  09/23/07  . Mole excision     x's 4   Family History  Problem Relation Age of Onset  . Adopted: Yes   Social History   Social History  . Marital status: Single    Spouse name: N/A  . Number of children: 0  . Years of education: N/A   Occupational History  . Teacher [employer]   Social History Main Topics  . Smoking status: Never Smoker  . Smokeless tobacco:  Never Used  . Alcohol use 0.6 oz/week    1 Standard drinks or equivalent per week     Comment: SOCIALLY  . Drug use: No  . Sexual activity: Yes    Partners: Male    Birth control/ protection: Pill     Comment: Hulda Humphrey   Other Topics Concern  . Not on file   Social History Narrative   Adopted from [place].    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   Drinks caffeine, uses herbal remedies   Wears her seatbelt, wears bicycle helmet   Reports routine exercise    Smoke detector in the home.   Feels safe in relationships.       ROS: Negative, with the exception of above mentioned in HPI  Objective: BP 101/64 (BP Location: Left Arm, Patient Position: Sitting, Cuff Size: Large)   Pulse 66   Temp 98.3 F (36.8 C)   Resp 20   Ht 5\' 3"  (1.6 m)   Wt 150 lb (68 kg)   SpO2 98%   BMI 26.57 kg/m  Gen: Afebrile. No acute distress.  HENT: AT. [place]. MMM.  Eyes:Pupils Equal Round Reactive to light, Extraocular movements intact,  Conjunctiva without redness, discharge or icterus. CV: RRR, no edema Chest: CTAB, no wheeze or crackles Skin: no rashes, purpura or petechiae.  Neuro:  Normal gait. PERLA. EOMi. Alert. Oriented  x3  Diabetic Foot Exam - Simple   Simple Foot Form Diabetic Foot exam was performed with the following findings:  Yes 05/12/2017  9:58 AM  Visual Inspection No deformities, no ulcerations, no other skin breakdown bilaterally:  Yes Sensation Testing Intact to touch and monofilament testing bilaterally:  Yes Pulse Check Posterior Tibialis and Dorsalis pulse intact bilaterally:  Yes Comments     Results for orders placed or performed in visit on 05/12/17 (from the past 24 hour(s))  POCT glycosylated hemoglobin (Hb A1C)     Status: Normal   Collection Time: 05/12/17  9:47 AM  Result Value Ref Range   Hemoglobin A1C 6.0     Assessment/plan: Brenda Rosales is a 31 y.o. female present for  Diabetes follow-up Diabetes  mellitus without complication ([place]) - Urine Microalbumin w/creat. Ratio 04/23/2016: WNL--> collected today - eye exam: now with DR. Manuella Ghazi, 02/09/2017 - foot exam completed today 05/12/2017. - flu shot UTD 2017 - BMP: 02/2017 WNL - PPSV 23 2008--> due 10/2017, prevnar 10/2016  - A1c 8.7--> 8.6--> 6.2--> 6.6 --> 6.0 Today.  - continue metformin 1000 mg BID.  -  Continue diet and exercise modifications. She completed the 5 K in May.  - Follow-up every 3 months   Return in about 3 months (around 08/24/2017) for DM.   Electronically signed by: Howard Pouch, DO [place]

## 2017-05-14 LAB — MICROALBUMIN / CREATININE URINE RATIO
CREATININE, URINE: 239 mg/dL (ref 20–320)
Microalb Creat Ratio: 3 mcg/mg creat (ref <30)
Microalb, Ur: 0.8 mg/dL

## 2017-05-15 ENCOUNTER — Telehealth: Payer: Self-pay | Admitting: Family Medicine

## 2017-05-15 NOTE — Telephone Encounter (Signed)
Please call pt:  her urine is normal. (microalbumin test for DM)

## 2017-05-15 NOTE — Telephone Encounter (Signed)
Left message with lab results on patient voice mail per DPR. 

## 2017-06-11 ENCOUNTER — Other Ambulatory Visit: Payer: Self-pay

## 2017-06-15 NOTE — Addendum Note (Signed)
Addended by: Graylon Good on: 06/15/2017 08:20 AM   Modules accepted: Orders

## 2017-06-17 ENCOUNTER — Other Ambulatory Visit (INDEPENDENT_AMBULATORY_CARE_PROVIDER_SITE_OTHER): Payer: BC Managed Care – PPO

## 2017-06-17 DIAGNOSIS — E559 Vitamin D deficiency, unspecified: Secondary | ICD-10-CM

## 2017-06-18 ENCOUNTER — Telehealth: Payer: Self-pay

## 2017-06-18 LAB — VITAMIN D 25 HYDROXY (VIT D DEFICIENCY, FRACTURES): VIT D 25 HYDROXY: 22.7 ng/mL — AB (ref 30.0–100.0)

## 2017-06-18 MED ORDER — VITAMIN D (ERGOCALCIFEROL) 1.25 MG (50000 UNIT) PO CAPS: 50000.0000 [IU] | ORAL_CAPSULE | ORAL | 0 refills | Status: DC

## 2017-06-18 NOTE — Telephone Encounter (Signed)
Left message to call Kaitlyn at [phone]. 

## 2017-06-18 NOTE — Telephone Encounter (Signed)
Patient returning your call.

## 2017-06-18 NOTE — Telephone Encounter (Signed)
-----   Message from Nunzio Cobbs, MD sent at 06/18/2017  5:15 AM EDT ----- Please contact patient with vit D level which is now 22.7. I am recommending she continue with vit D 50,000 IU orally weekly for another 3 months. Please send to her pharmacy of choice and schedule a lab recheck appt. I will place a future order for recheck of vit D.

## 2017-06-18 NOTE — Telephone Encounter (Signed)
Spoke with patient. Advised of results as seen below from [place]. Patient verbalizes understanding. Rx for Vitamin D 50,000 IU take 1 tablet weekly #12 0RF sent to pharmacy on file. 3 month lab recheck scheduled for 09/25/17 at 3:30 pm with Dr.Silva. Patient is agreeable to date and time.

## 2017-08-14 ENCOUNTER — Encounter: Payer: Self-pay | Admitting: Family Medicine

## 2017-08-14 ENCOUNTER — Ambulatory Visit (INDEPENDENT_AMBULATORY_CARE_PROVIDER_SITE_OTHER): Payer: BC Managed Care – PPO | Admitting: Family Medicine

## 2017-08-14 VITALS — BP 100/68 | HR 84 | Temp 98.6°F | Resp 20 | Ht 63.0 in | Wt 150.8 lb

## 2017-08-14 DIAGNOSIS — E119 Type 2 diabetes mellitus without complications: Secondary | ICD-10-CM | POA: Diagnosis not present

## 2017-08-14 LAB — POCT GLYCOSYLATED HEMOGLOBIN (HGB A1C): Hemoglobin A1C: 6.6

## 2017-08-14 MED ORDER — METFORMIN HCL 1000 MG PO TABS: 1000.0000 mg | ORAL_TABLET | Freq: Two times a day (BID) | ORAL | 1 refills | Status: DC

## 2017-08-14 NOTE — Patient Instructions (Signed)
Your diabetes look good. Just remember to take med twice a day!  Happy holidays! See you in 3.5 months.

## 2017-08-14 NOTE — Progress Notes (Signed)
Patient ID: Brenda Rosales, female   DOB: September 06, 1986, 31 y.o.   MRN: [id number]      Patient ID: Brenda Rosales, female  DOB: November 08, 1986, 31 y.o.   MRN: [id number]  Subjective:  Brenda Rosales is a 31 y.o. female present for follow up on uncontrolled diabetes.  Patient Care Team    Relationship Specialty Notifications Start End  Ma Hillock, DO PCP - General Family Medicine  04/23/16    Comment: Patient request  Nunzio Cobbs, MD Consulting Physician Obstetrics and Gynecology  04/23/16   Deneise Lever, MD Consulting Physician Pulmonary Disease  04/23/16   Irene Shipper, MD Consulting Physician Gastroenterology  04/23/16   Danice Goltz, MD Consulting Physician Ophthalmology  02/10/17    Diabetes:  Pt reports forgetting her 2nd dose of metformin frequently over the summer. She is tolerating med. Patient denies dizziness, hyperglycemic or hypoglycemic events. Patient denies numbness, tingling in the extremities or nonhealing wounds of feet.  Pt reports BG ranges does not check.  PNA series: Pna23 due in 10/2017. Prevanr completed 10/2016 Flu shot: utd 2018 (recommneded yearly) BMP: UTD 02/19/2017. WNL Foot exam: completed today Eye exam: Dr. Manuella Ghazi, UTD 02/2017 A1c: 6.0 --> 6.6  Prior note: patient present for follow up on her newly re-diagnosed uncontrolled diabetes. She was seen about 5 months ago and started on metformin taper after an 8.7 of a1c. She did not start her metformin. She states she wanted to try diet and exercise first because that is how she did it before and never took medicine.  She voiced last OV she did not think she needed medicine. She endorses cardio exercise 3x a week for 30 minutes and decreasing her carbs/sugar content in meals. She has been diagnosed with an a1c 11% in 2012, and never took medications. She was agreeable to fish oil supplement (triglycerides > 500) and nutrition/diabetes education, referrals were made, but she did not  attend. She states it would have cost her > $300. She denies hyper/hypoglycemic events, neuropathy or nonhealing wounds.    Recent Results (from the past 2160 hour(s))  VITAMIN D 25 Hydroxy (Vit-D Deficiency, Fractures)     Status: Abnormal   Collection Time: 06/17/17 11:08 AM  Result Value Ref Range   Vit D, 25-Hydroxy 22.7 (L) 30.0 - 100.0 ng/mL    Comment: Vitamin D deficiency has been defined by the [place] practice guideline as a level of serum 25-OH vitamin D less than 20 ng/mL (1,2). The Endocrine Society went on to further define vitamin D insufficiency as a level between 21 and 29 ng/mL (2). 1. IOM (Institute of Medicine). 2010. Dietary reference    intakes for calcium and D. [place]: The    [employer]. 2. Holick MF, Binkley [place], Bischoff-Ferrari HA, et al.    Evaluation, treatment, and prevention of vitamin D    deficiency: an Endocrine Society clinical practice    guideline. JCEM. 2011 Jul; 96(7):1911-30.   POCT glycosylated hemoglobin (Hb A1C)     Status: Abnormal   Collection Time: 08/14/17  8:15 AM  Result Value Ref Range   Hemoglobin A1C 6.6      Immunization History  Administered Date(s) Administered  . Influenza Whole 08/19/2009  . Influenza,inj,Quad PF,6+ Mos 07/31/2016  . Influenza-Unspecified 08/11/2015, 07/12/2017  . Pneumococcal Conjugate-13 11/06/2016  . Pneumococcal Polysaccharide-23 07/23/2007  . Td 09/11/2004  . Tdap 07/04/2012     Past Medical History:  Diagnosis Date  .  Allergy   . Anxiety   . ASTHMA 11/19/2007  . CHICKENPOX 11/19/2007  . Diabetes mellitus without complication (HCC)    diet controlled  . Duodenitis   . Dysmenorrhea 11/19/2007  . DYSPHAGIA 11/19/2010  . ECZEMA 11/19/2007  . Esophageal stricture   . Esophagitis   . FATTY LIVER DISEASE 04/17/2008  . GERD 11/20/2010  . Hyperlipidemia   . IBS (irritable bowel syndrome)   . Infection of eyelid 08/07/2011  . Low vitamin D  level 2018  . Other and unspecified noninfectious gastroenteritis and colitis(558.9) 02/2014  . RHINOSINUSITIS, CHRONIC 12/09/2007  . TB SKIN TEST, POSITIVE 11/19/2007   Allergies  Allergen Reactions  . Vancomycin Other (See Comments)    redmans syndrome    Past Surgical History:  Procedure Laterality Date  . CT SINUS LTD W/O CM  09/23/07  . Mole excision     x's 4   Family History  Problem Relation Age of Onset  . Adopted: Yes   Social History   Social History  . Marital status: Single    Spouse name: N/A  . Number of children: 0  . 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 Smoking status: Never Smoker  . Smokeless tobacco: Never Used  . Alcohol use 0.6 oz/week    1 Standard drinks or equivalent per week     Comment: SOCIALLY  . Drug use: No  . Sexual activity: Yes    Partners: Male    Birth control/ protection: Pill     Comment: Hulda Humphrey   Other Topics Concern  . Not on file   Social History Narrative   Adopted from [place].    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   Drinks caffeine, uses herbal remedies   Wears her seatbelt, wears bicycle helmet   Reports routine exercise    Smoke detector in the home.   Feels safe in relationships.       ROS: Negative, with the exception of above mentioned in HPI  Objective: BP 100/68 (BP Location: Right Arm, Patient Position: Sitting, Cuff Size: Normal)   Pulse 84   Temp 98.6 F (37 C)   Resp 20   Ht 5\' 3"  (1.6 m)   Wt 150 lb 12 oz (68.4 kg)   SpO2 98%   BMI 26.70 kg/m  Gen: Afebrile. No acute distress.  HENT: AT. [place]. MMM.  Eyes:Pupils Equal Round Reactive to light, Extraocular movements intact,  Conjunctiva without redness, discharge or icterus. CV: RRR no murmur, no edema, +2/4 P posterior tibialis pulses Chest: CTAB, no wheeze or crackles Abd: Soft. NTND. BS present.  Neuro: Normal gait. PERLA.  EOMi. Alert. Oriented.   Results for orders placed or performed in visit on 08/14/17 (from the past 24 hour(s))  POCT glycosylated hemoglobin (Hb A1C)     Status: Abnormal   Collection Time: 08/14/17  8:15 AM  Result Value Ref Range   Hemoglobin A1C 6.6     Assessment/plan: Brenda Rosales is a 31 y.o. female present for  Diabetes follow-up Diabetes mellitus without complication ([place]) - Stable. Continue metformin BID.  - Urine Microalbumin w/creat. Ratio 05/12/2017: WNL - eye exam: now with DR. Manuella Ghazi, 02/09/2017  - foot exam completed today 05/12/2017. - flu shot UTD 2018 - BMP: 02/2017 WNL - PPSV 23 2008--> due 10/2017, prevnar 10/2016  - A1c 8.7--> 8.6--> 6.2--> 6.6 --> 6.0-->  6.6 today  -  Continue diet and exercise modifications. She completed the 5 K in May.  - Follow-up every 3-4 months   Return in about 3 months (around 11/24/2017) for DM.   Electronically signed by: Howard Pouch, DO [place]

## 2017-09-25 ENCOUNTER — Other Ambulatory Visit: Payer: BC Managed Care – PPO

## 2017-09-28 ENCOUNTER — Other Ambulatory Visit (INDEPENDENT_AMBULATORY_CARE_PROVIDER_SITE_OTHER): Payer: BC Managed Care – PPO

## 2017-09-28 DIAGNOSIS — E559 Vitamin D deficiency, unspecified: Secondary | ICD-10-CM

## 2017-09-29 LAB — VITAMIN D 25 HYDROXY (VIT D DEFICIENCY, FRACTURES): Vit D, 25-Hydroxy: 15.4 ng/mL — ABNORMAL LOW (ref 30.0–100.0)

## 2017-09-30 ENCOUNTER — Telehealth: Payer: Self-pay

## 2017-09-30 MED ORDER — VITAMIN D (ERGOCALCIFEROL) 1.25 MG (50000 UNIT) PO CAPS: 50000.0000 [IU] | ORAL_CAPSULE | ORAL | 0 refills | Status: DC

## 2017-09-30 NOTE — Telephone Encounter (Signed)
-----   Message from Nunzio Cobbs, MD sent at 09/30/2017  6:18 AM EST ----- Please contact patient with vit D results.  Her vit D went down from her last check. Please confirm if she has been taking her vitamin D 50,000 IU weekly.  My plan is to have her take vit D 50,000 IU twice a week and repeat a level again in 3 months.  She will need a lab visit scheduled.

## 2017-09-30 NOTE — Telephone Encounter (Signed)
Left message to call Latavia Goga at [phone]. 

## 2017-09-30 NOTE — Telephone Encounter (Signed)
Spoke with patient. Advised of results as seen below from [place]. Patient is agreeable. Rx for Vitamin D 50,000 IU twice weekly #[id number] sent to pharmacy on file. Lab recheck scheduled for 01/01/2017 at 4 pm. Patient is agreeable to date and time. Encounter closed.

## 2017-10-05 ENCOUNTER — Encounter: Payer: Self-pay | Admitting: Family Medicine

## 2017-10-05 ENCOUNTER — Ambulatory Visit: Payer: BC Managed Care – PPO | Admitting: Family Medicine

## 2017-10-05 VITALS — BP 119/80 | HR 82 | Temp 99.1°F | Resp 18 | Wt 157.8 lb

## 2017-10-05 DIAGNOSIS — J40 Bronchitis, not specified as acute or chronic: Secondary | ICD-10-CM | POA: Diagnosis not present

## 2017-10-05 MED ORDER — METHYLPREDNISOLONE ACETATE 80 MG/ML IJ SUSP
80.0000 mg | Freq: Once | INTRAMUSCULAR | Status: AC
Start: 2017-10-05 — End: 2017-10-05
  Administered 2017-10-05: 80 mg via INTRAMUSCULAR

## 2017-10-05 MED ORDER — PREDNISONE 50 MG PO TABS: 50.0000 mg | ORAL_TABLET | Freq: Every day | ORAL | 0 refills | Status: DC

## 2017-10-05 MED ORDER — AMOXICILLIN-POT CLAVULANATE 875-125 MG PO TABS
1.0000 | ORAL_TABLET | Freq: Two times a day (BID) | ORAL | 0 refills | Status: DC
Start: 2017-10-05 — End: 2017-11-30

## 2017-10-05 NOTE — Addendum Note (Signed)
Addended by: Gordy Councilman on: 10/05/2017 03:28 PM   Modules accepted: Orders

## 2017-10-05 NOTE — Progress Notes (Signed)
Brenda Rosales , 28-Jun-1986, 31 y.o., female MRN: [id number] Patient Care Team    Relationship Specialty Notifications Start End  Ma Hillock, DO PCP - General Family Medicine  04/23/16    Comment: Patient request  Nunzio Cobbs, MD Consulting Physician Obstetrics and Gynecology  04/23/16   Deneise Lever, MD Consulting Physician Pulmonary Disease  04/23/16   Irene Shipper, MD Consulting Physician Gastroenterology  04/23/16   Danice Goltz, MD Consulting Physician Ophthalmology  02/10/17     Chief Complaint  Patient presents with  . Laryngitis    congestion,ears wont pop, x 4 days     Subjective: Pt presents for an OV with complaints of nasal congestion, ear discomfort, nasal congestion an dhoarseness of 4 days duration. She denies fever, chills, nausea, vomit diarrhea or headache.  Pt has tried OTC med to ease their symptoms.   Depression screen [facility] 2/9 08/14/2017 04/23/2016 06/19/2015 06/15/2015 06/13/2015  Decreased Interest 0 0 0 0 0  Down, Depressed, Hopeless 0 0 0 0 0  PHQ - 2 Score 0 0 0 0 0    Allergies  Allergen Reactions  . Vancomycin Other (See Comments)    redmans syndrome    Social History   Tobacco Use  . Smoking status: Never Smoker  . Smokeless tobacco: Never Used  Substance Use Topics  . Alcohol use: Yes    Alcohol/week: 0.6 oz    Types: 1 Standard drinks or equivalent per week    Comment: SOCIALLY   Past Medical History:  Diagnosis Date  . Allergy   . Anxiety   . ASTHMA 11/19/2007  . CHICKENPOX 11/19/2007  . Diabetes mellitus without complication (HCC)    diet controlled  . Duodenitis   . Dysmenorrhea 11/19/2007  . DYSPHAGIA 11/19/2010  . ECZEMA 11/19/2007  . Esophageal stricture   . Esophagitis   . FATTY LIVER DISEASE 04/17/2008  . GERD 11/20/2010  . Hyperlipidemia   . IBS (irritable bowel syndrome)   . Infection of eyelid 08/07/2011  . Low vitamin D level 2018  . Other and unspecified noninfectious gastroenteritis and  colitis(558.9) 02/2014  . RHINOSINUSITIS, CHRONIC 12/09/2007  . TB SKIN TEST, POSITIVE 11/19/2007   Past Surgical History:  Procedure Laterality Date  . CT SINUS LTD W/O CM  09/23/07  . Mole excision     x's 4   Family History  Adopted: Yes   Allergies as of 10/05/2017      Reactions   Vancomycin Other (See Comments)   redmans syndrome       Medication List        Accurate as of 10/05/17  3:05 PM. Always use your most recent med list.          albuterol 108 (90 Base) MCG/ACT inhaler Commonly known as:  PROVENTIL HFA;VENTOLIN HFA Inhale 2 puffs into the lungs every 6 (six) hours as needed for wheezing or shortness of breath.   EPINEPHrine 0.3 mg/0.3 mL Soaj injection Commonly known as:  EPI-PEN Inject 0.3 mg into the muscle once.   levocetirizine 5 MG tablet Commonly known as:  XYZAL Take 1 tablet (5 mg total) by mouth every evening.   metFORMIN 1000 MG tablet Commonly known as:  GLUCOPHAGE Take 1 tablet (1,000 mg total) by mouth 2 (two) times daily with a meal.   norethindrone-ethinyl estradiol 0.4-35 MG-MCG tablet Commonly known as:  BALZIVA Take 1 tablet by mouth daily.   SYMBICORT 160-4.5 MCG/ACT inhaler Generic drug:  budesonide-formoterol Inhale 2 puffs into the lungs 2 (two) times daily.   tacrolimus 0.1 % ointment Commonly known as:  PROTOPIC Apply topically 2 (two) times daily.   Vitamin D (Ergocalciferol) 50000 units Caps capsule Commonly known as:  DRISDOL Take 1 capsule (50,000 Units total) by mouth 2 (two) times a week.       All past medical history, surgical history, allergies, family history, immunizations andmedications were updated in the EMR today and reviewed under the history and medication portions of their EMR.     ROS: Negative, with the exception of above mentioned in HPI   Objective:  BP 119/80 (BP Location: Right Arm, Patient Position: Sitting, Cuff Size: Normal)   Pulse 82   Temp 99.1 F (37.3 C)   Resp 18   Wt 157 lb 12  oz (71.6 kg)   LMP 09/10/2017   SpO2 96%   BMI 27.94 kg/m  Body mass index is 27.94 kg/m. Gen: low grade fever. No acute distress. Nontoxic in appearance, well developed, well nourished.  HENT: AT. [place]. Bilateral TM visualized with bilateral fullness, no erythema. MMM, no oral lesions. Bilateral nares with erythema and drainage. Throat without erythema or exudates. PND, hoarseness present.  Eyes:Pupils Equal Round Reactive to light, Extraocular movements intact,  Conjunctiva without redness, discharge or icterus. Neck/lymp/endocrine: Supple,mild ant cervical  lymphadenopathy CV: RRR  Chest: CTAB, no wheeze or crackles. Good air movement, normal resp effort.  Abd: Soft. NTND. BS present Skin: no rashes, purpura or petechiae.  Neuro: Normal gait. PERLA. EOMi. Alert. Oriented x3   No exam data present No results found. No results found for this or any previous visit (from the past 24 hour(s)).  Assessment/Plan: Elica Almas is a 31 y.o. female present for OV for  Bronchitis Rest, hydrate.  +/- flonase, mucinex (DM if cough), nettie pot or nasal saline.  Augmentin and prednisone prescribed, take until completed.  IM depo medrol provided.  If cough present it can last up to 6-8 weeks.  F/U 2 weeks of not improved.    Reviewed expectations re: course of current medical issues.  Discussed self-management of symptoms.  Outlined signs and symptoms indicating need for more acute intervention.  Patient verbalized understanding and all questions were answered.  Patient received an After-Visit Summary.    No orders of the defined types were placed in this encounter.    Note is dictated utilizing voice recognition software. Although note has been proof read prior to signing, occasional typographical errors still can be missed. If any questions arise, please do not hesitate to call for verification.   electronically signed by:  Howard Pouch, DO  [place]

## 2017-10-05 NOTE — Patient Instructions (Signed)
Rest, hydrate.  + flonase, mucinex (DM if cough). Steroid shot provided today will help you start to feel better.  Augmentin (start today)  and prednisone (start tomorrow) prescribed, take until completed.     Acute Bronchitis, Adult Acute bronchitis is when air tubes (bronchi) in the lungs suddenly get swollen. The condition can make it hard to breathe. It can also cause these symptoms:  A cough.  Coughing up clear, yellow, or green mucus.  Wheezing.  Chest congestion.  Shortness of breath.  A fever.  Body aches.  Chills.  A sore throat.  Follow these instructions at home: Medicines  Take over-the-counter and prescription medicines only as told by your doctor.  If you were prescribed an antibiotic medicine, take it as told by your doctor. Do not stop taking the antibiotic even if you start to feel better. General instructions  Rest.  Drink enough fluids to keep your pee (urine) clear or pale yellow.  Avoid smoking and secondhand smoke. If you smoke and you need help quitting, ask your doctor. Quitting will help your lungs heal faster.  Use an inhaler, cool mist vaporizer, or humidifier as told by your doctor.  Keep all follow-up visits as told by your doctor. This is important. How is this prevented? To lower your risk of getting this condition again:  Wash your hands often with soap and water. If you cannot use soap and water, use hand sanitizer.  Avoid contact with people who have cold symptoms.  Try not to touch your hands to your mouth, nose, or eyes.  Make sure to get the flu shot every year.  Contact a doctor if:  Your symptoms do not get better in 2 weeks. Get help right away if:  You cough up blood.  You have chest pain.  You have very bad shortness of breath.  You become dehydrated.  You faint (pass out) or keep feeling like you are going to pass out.  You keep throwing up (vomiting).  You have a very bad headache.  Your fever or  chills gets worse. This information is not intended to replace advice given to you by your health care provider. Make sure you discuss any questions you have with your health care provider. Document Released: 04/14/2008 Document Revised: 06/04/2016 Document Reviewed: 04/16/2016 Elsevier Interactive Patient Education  2017 [employer].

## 2017-11-30 ENCOUNTER — Encounter: Payer: Self-pay | Admitting: Family Medicine

## 2017-11-30 ENCOUNTER — Ambulatory Visit: Payer: BC Managed Care – PPO | Admitting: Family Medicine

## 2017-11-30 VITALS — BP 108/70 | HR 79 | Temp 97.3°F | Wt 157.4 lb

## 2017-11-30 DIAGNOSIS — E663 Overweight: Secondary | ICD-10-CM

## 2017-11-30 DIAGNOSIS — E119 Type 2 diabetes mellitus without complications: Secondary | ICD-10-CM | POA: Diagnosis not present

## 2017-11-30 LAB — POCT GLYCOSYLATED HEMOGLOBIN (HGB A1C): Hemoglobin A1C: 7.4

## 2017-11-30 MED ORDER — METFORMIN HCL 1000 MG PO TABS: 1000.0000 mg | ORAL_TABLET | Freq: Two times a day (BID) | ORAL | 1 refills | Status: DC

## 2017-11-30 NOTE — Addendum Note (Signed)
Addended by: Leota Jacobsen on: 11/30/2017 10:17 AM   Modules accepted: Orders

## 2017-11-30 NOTE — Progress Notes (Signed)
Patient ID: Brenda Rosales, female   DOB: 1986-04-24, 32 y.o.   MRN: [id number]      Patient ID: Brenda Rosales, female  DOB: [birth year], 32 y.o.   MRN: [id number]  Subjective:  Brenda Rosales is a 32 y.o. female present for follow up on uncontrolled diabetes.  Patient Care Team    Relationship Specialty Notifications Start End  Ma Hillock, DO PCP - General Family Medicine  04/23/16    Comment: Patient request  Nunzio Cobbs, MD Consulting Physician Obstetrics and Gynecology  04/23/16   Deneise Lever, MD Consulting Physician Pulmonary Disease  04/23/16   Irene Shipper, MD Consulting Physician Gastroenterology  04/23/16   Danice Goltz, MD Consulting Physician Ophthalmology  02/10/17    Diabetes:  Pt reports missing meds for about 2 weeks. She was moving and lost medications. She also had an acute illness requiring prednisone. More stress secondary to deaths. Patient denies dizziness, hyperglycemic or hypoglycemic events. Patient denies numbness, tingling in the extremities or nonhealing wounds of feet.   Pt reports BG ranges does not check.  PNA series: Pna23 due in 10/2017. Prevanr completed 10/2016 Flu shot: utd 2018 (recommneded yearly) BMP: UTD 02/19/2017. WNL Foot exam: completed today Eye exam: Dr. Manuella Ghazi, UTD 02/2017 A1c: 6.0 --> 6.6--> 7.4 today  Prior note: patient present for follow up on her newly re-diagnosed uncontrolled diabetes. She was seen about 5 months ago and started on metformin taper after an 8.7 of a1c. She did not start her metformin. She states she wanted to try diet and exercise first because that is how she did it before and never took medicine.  She voiced last OV she did not think she needed medicine. She endorses cardio exercise 3x a week for 30 minutes and decreasing her carbs/sugar content in meals. She has been diagnosed with an a1c 11% in 2012, and never took medications. She was agreeable to fish oil supplement (triglycerides >  500) and nutrition/diabetes education, referrals were made, but she did not attend. She states it would have cost her > $300. She denies hyper/hypoglycemic events, neuropathy or nonhealing wounds.    Recent Results (from the past 2160 hour(s))  VITAMIN D 25 Hydroxy (Vit-D Deficiency, Fractures)     Status: Abnormal   Collection Time: 09/28/17  9:25 AM  Result Value Ref Range   Vit D, 25-Hydroxy 15.4 (L) 30.0 - 100.0 ng/mL    Comment: Vitamin D deficiency has been defined by the [place] practice guideline as a level of serum 25-OH vitamin D less than 20 ng/mL (1,2). The Endocrine Society went on to further define vitamin D insufficiency as a level between 21 and 29 ng/mL (2). 1. IOM (Institute of Medicine). 2010. Dietary reference    intakes for calcium and D. [place]: The    [employer]. 2. Holick MF, Binkley [place], Bischoff-Ferrari HA, et al.    Evaluation, treatment, and prevention of vitamin D    deficiency: an Endocrine Society clinical practice    guideline. JCEM. 2011 Jul; 96(7):1911-30.      Immunization History  Administered Date(s) Administered  . Influenza Whole 08/19/2009  . Influenza,inj,Quad PF,6+ Mos 07/31/2016  . Influenza-Unspecified 08/11/2015, 07/12/2017  . Pneumococcal Conjugate-13 11/06/2016  . Pneumococcal Polysaccharide-23 07/23/2007  . Td 09/11/2004  . Tdap 07/04/2012     Past Medical History:  Diagnosis Date  . Allergy   . Anxiety   . ASTHMA 11/19/2007  . CHICKENPOX 11/19/2007  .  Diabetes mellitus without complication (HCC)    diet controlled  . Duodenitis   . Dysmenorrhea 11/19/2007  . DYSPHAGIA 11/19/2010  . ECZEMA 11/19/2007  . Esophageal stricture   . Esophagitis   . FATTY LIVER DISEASE 04/17/2008  . GERD 11/20/2010  . Hyperlipidemia   . IBS (irritable bowel syndrome)   . Infection of eyelid 08/07/2011  . Low vitamin D level 2018  . Other and unspecified noninfectious gastroenteritis and  colitis(558.9) 02/2014  . RHINOSINUSITIS, CHRONIC 12/09/2007  . TB SKIN TEST, POSITIVE 11/19/2007   Allergies  Allergen Reactions  . Vancomycin Other (See Comments)    redmans syndrome    Past Surgical History:  Procedure Laterality Date  . CT SINUS LTD W/O CM  09/23/07  . Mole excision     x's 4   Family History  Adopted: Yes   Social History   Socioeconomic History  . Marital status: Single    Spouse name: Not on file  . Number of children: 0  . Years of education: Not on file  . Highest education level: Not on file  Social Needs  . Financial resource strain: Not on file  . Food insecurity - worry: Not on file  . Food insecurity - inability: Not on file  . Transportation needs - medical: Not on file  . Transportation needs - non-medical: Not on file  Occupational History  . Occupation: Product manager: Psychologist, sport and exercise [employer]  Tobacco Use  . Smoking status: Never Smoker  . Smokeless tobacco: Never Used  Substance and Sexual Activity  . Alcohol use: Yes    Alcohol/week: 0.6 oz    Types: 1 Standard drinks or equivalent per week    Comment: SOCIALLY  . Drug use: No  . Sexual activity: Yes    Partners: Male    Birth control/protection: Pill    Comment: Hulda Humphrey  Other Topics Concern  . Not on file  Social History Narrative   Adopted from [place].    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   Drinks caffeine, uses herbal remedies   Wears her seatbelt, wears bicycle helmet   Reports routine exercise    Smoke detector in the home.   Feels safe in relationships.       ROS: Negative, with the exception of above mentioned in HPI  Objective: BP 108/70 (BP Location: Left Arm, Patient Position: Sitting, Cuff Size: Normal)   Pulse 79   Temp (!) 97.3 F (36.3 C) (Oral)   Wt 157 lb 6.4 oz (71.4 kg)   LMP 11/10/2017   SpO2 97%   BMI 27.88 kg/m  Gen: Afebrile. No acute distress. Nontoxic in appearance, well developed,  pleasant caucasian female.  HENT: AT. [place].  MMM.  Eyes:Pupils Equal Round Reactive to light, Extraocular movements intact,  Conjunctiva without redness, discharge or icterus. CV: RRR no murmur, no edema, +2/4 P posterior tibialis pulses Chest: CTAB, no wheeze or crackles Neuro: Normal gait. PERLA. EOMi. Alert. Oriented x3  Psych: Normal affect, dress and demeanor. Normal speech. Normal thought content and judgment.   No results found for this or any previous visit (from the past 24 hour(s)).  Assessment/plan: Brenda Rosales is a 32 y.o. female present for  Diabetes follow-up Diabetes mellitus without complication ([place]) - Stable.   Continue metformin BID. Refills provided today.  - Urine Microalbumin w/creat. Ratio 05/12/2017: WNL - eye exam: now with DR. Manuella Ghazi, 02/09/2017  - foot exam completed today  05/12/2017. - flu shot UTD 2018 - BMP: 02/2017 WNL - PPSV 23 2008--> due 10/2017, prevnar 10/2016  - A1c 8.7--> 8.6--> 6.2--> 6.6 --> 6.0--> 6.6--> 7.4 today  -  Continue diet and exercise modifications. She completed the 5 K in May.  - Follow-up every 3-4 months   Return in about 3 months (around 03/16/2018).   Electronically signed by: Howard Pouch, DO [place]

## 2017-11-30 NOTE — Patient Instructions (Addendum)
It was a  pleasure to see you today.  Your a1c: 7.4, went up some from 6.6 last time. Refocus on your diet now that you are all moved. Remember to take your medicine twice a day.   Follow up in 3.5 months.    Please help [place] help you:  We are honored you have chosen [facility] for your Primary Care home. Below you will find basic instructions that you may need to access in the future. Please help [place] help you by reading the instructions, which cover many of the frequent questions we experience.   Prescription refills and request:  -In order to allow more efficient response time, please call your pharmacy for all refills. They will forward the request electronically to [place]. This allows for the quickest possible response. Request left on a nurse line can take longer to refill, since these are checked as time allows between office patients and other phone calls.  - refill request can take up to 3-5 working days to complete.  - If request is sent electronically and request is appropiate, it is usually completed in 1-2 business days.  - all patients will need to be seen routinely for all chronic medical conditions requiring prescription medications (see follow-up below). If you are overdue for follow up on your condition, you will be asked to make an appointment and we will call in enough medication to cover you until your appointment (up to 30 days).  - all controlled substances will require a face to face visit to request/refill.  - if you desire your prescriptions to go through a new pharmacy, and have an active script at original pharmacy, you will need to call your pharmacy and have scripts transferred to new pharmacy. This is completed between the pharmacy locations and not by your provider.    Results: If any images or labs were ordered, it can take up to 1 week to get results depending on the test ordered and the lab/facility running and resulting the test. - Normal or stable results, which  do not need further discussion, may be released to your mychart immediately with attached note to you. A call may not be generated for normal results. Please make certain to sign up for mychart. If you have questions on how to activate your mychart you can call the front office.  - If your results need further discussion, our office will attempt to contact you via phone, and if unable to reach you after 2 attempts, we will release your abnormal result to your mychart with instructions.  - All results will be automatically released in mychart after 1 week.  - Your provider will provide you with explanation and instruction on all relevant material in your results. Please keep in mind, results and labs may appear confusing or abnormal to the untrained eye, but it does not mean they are actually abnormal for you personally. If you have any questions about your results that are not covered, or you desire more detailed explanation than what was provided, you should make an appointment with your provider to do so.   Our office handles many outgoing and incoming calls daily. If we have not contacted you within 1 week about your results, please check your mychart to see if there is a message first and if not, then contact our office.  In helping with this matter, you help decrease call volume, and therefore allow [place] to be able to respond to patients needs more efficiently.  Acute office visits (sick visit):  An acute visit is intended for a new problem and are scheduled in shorter time slots to allow schedule openings for patients with new problems. This is the appropriate visit to discuss a new problem. In order to provide you with excellent quality medical care with proper time for you to explain your problem, have an exam and receive treatment with instructions, these appointments should be limited to one new problem per visit. If you experience a new problem, in which you desire to be addressed, please make an  acute office visit, we save openings on the schedule to accommodate you. Please do not save your new problem for any other type of visit, let us take care of it properly and quickly for you.   Follow up visits:  Depending on your condition(s) your provider will need to see you routinely in order to provide you with quality care and prescribe medication(s). Most chronic conditions (Example: hypertension, Diabetes, depression/anxiety... etc), require visits a couple times a year. Your provider will instruct you on proper follow up for your personal medical conditions and history. Please make certain to make follow up appointments for your condition as instructed. Failing to do so could result in lapse in your medication treatment/refills. If you request a refill, and are overdue to be seen on a condition, we will always provide you with a 30 day script (once) to allow you time to schedule.    Medicare wellness (well visit): - we have a wonderful Nurse Maudie Mercury), that will meet with you and provide you will yearly medicare wellness visits. These visits should occur yearly (can not be scheduled less than 1 calendar year apart) and cover preventive health, immunizations, advance directives and screenings you are entitled to yearly through your medicare benefits. Do not miss out on your entitled benefits, this is when medicare will pay for these benefits to be ordered for you.  These are strongly encouraged by your provider and is the appropriate type of visit to make certain you are up to date with all preventive health benefits. If you have not had your medicare wellness exam in the last 12 months, please make certain to schedule one by calling the office and schedule your medicare wellness with Maudie Mercury as soon as possible.   Yearly physical (well visit):  - Adults are recommended to be seen yearly for physicals. Check with your insurance and date of your last physical, most insurances require one calendar year  between physicals. Physicals include all preventive health topics, screenings, medical exam and labs that are appropriate for gender/age and history. You may have fasting labs needed at this visit. This is a well visit (not a sick visit), new problems should not be covered during this visit (see acute visit).  - Pediatric patients are seen more frequently when they are younger. Your provider will advise you on well child visit timing that is appropriate for your their age. - This is not a medicare wellness visit. Medicare wellness exams do not have an exam portion to the visit. Some medicare companies allow for a physical, some do not allow a yearly physical. If your medicare allows a yearly physical you can schedule the medicare wellness with our nurse Maudie Mercury and have your physical with your provider after, on the same day. Please check with insurance for your full benefits.   Late Policy/No Shows:  - all new patients should arrive 15-30 minutes earlier than appointment to allow [place] time  to  obtain all personal demographics,  insurance information and for you to complete office paperwork. - All established patients should arrive 10-15 minutes earlier than appointment time to update all information and be checked in .  - In our best efforts to run on time, if you are late for your appointment you will be asked to either reschedule or if able, we will work you back into the schedule. There will be a wait time to work you back in the schedule,  depending on availability.  - If you are unable to make it to your appointment as scheduled, please call 24 hours ahead of time to allow [place] to fill the time slot with someone else who needs to be seen. If you do not cancel your appointment ahead of time, you may be charged a no show fee.

## 2017-12-21 ENCOUNTER — Other Ambulatory Visit: Payer: Self-pay | Admitting: Obstetrics and Gynecology

## 2017-12-29 ENCOUNTER — Ambulatory Visit: Payer: BC Managed Care – PPO | Admitting: Family Medicine

## 2017-12-29 ENCOUNTER — Encounter: Payer: Self-pay | Admitting: Family Medicine

## 2017-12-29 VITALS — BP 115/79 | HR 64 | Temp 98.7°F | Resp 20 | Wt 156.2 lb

## 2017-12-29 DIAGNOSIS — J0110 Acute frontal sinusitis, unspecified: Secondary | ICD-10-CM

## 2017-12-29 DIAGNOSIS — R6889 Other general symptoms and signs: Secondary | ICD-10-CM | POA: Diagnosis not present

## 2017-12-29 LAB — POC INFLUENZA A&B (BINAX/QUICKVUE)
Influenza A, POC: NEGATIVE
Influenza B, POC: NEGATIVE

## 2017-12-29 MED ORDER — BENZONATATE 200 MG PO CAPS: 200.0000 mg | ORAL_CAPSULE | Freq: Two times a day (BID) | ORAL | 0 refills | Status: DC | PRN

## 2017-12-29 MED ORDER — AMOXICILLIN-POT CLAVULANATE 875-125 MG PO TABS: 1.0000 | ORAL_TABLET | Freq: Two times a day (BID) | ORAL | 0 refills | Status: DC

## 2017-12-29 NOTE — Patient Instructions (Signed)
Rest, hydrate. Tylenol/advil.  + flonase, mucinex DM if cough, nettie pot or nasal saline.  Augmentin  prescribed, take until completed.  Tessalon perles for cough If cough present it can last up to 6-8 weeks.  F/U 2 weeks of not improved.    Sinusitis, Adult Sinusitis is soreness and inflammation of your sinuses. Sinuses are hollow spaces in the bones around your face. They are located:  Around your eyes.  In the middle of your forehead.  Behind your nose.  In your cheekbones.  Your sinuses and nasal passages are lined with a stringy fluid (mucus). Mucus normally drains out of your sinuses. When your nasal tissues get inflamed or swollen, the mucus can get trapped or blocked so air cannot flow through your sinuses. This lets bacteria, viruses, and funguses grow, and that leads to infection. Follow these instructions at home: Medicines  Take, use, or apply over-the-counter and prescription medicines only as told by your doctor. These may include nasal sprays.  If you were prescribed an antibiotic medicine, take it as told by your doctor. Do not stop taking the antibiotic even if you start to feel better. Hydrate and Humidify  Drink enough water to keep your pee (urine) clear or pale yellow.  Use a cool mist humidifier to keep the humidity level in your home above 50%.  Breathe in steam for 10-15 minutes, 3-4 times a day or as told by your doctor. You can do this in the bathroom while a hot shower is running.  Try not to spend time in cool or dry air. Rest  Rest as much as possible.  Sleep with your head raised (elevated).  Make sure to get enough sleep each night. General instructions  Put a warm, moist washcloth on your face 3-4 times a day or as told by your doctor. This will help with discomfort.  Wash your hands often with soap and water. If there is no soap and water, use hand sanitizer.  Do not smoke. Avoid being around people who are smoking (secondhand  smoke).  Keep all follow-up visits as told by your doctor. This is important. Contact a doctor if:  You have a fever.  Your symptoms get worse.  Your symptoms do not get better within 10 days. Get help right away if:  You have a very bad headache.  You cannot stop throwing up (vomiting).  You have pain or swelling around your face or eyes.  You have trouble seeing.  You feel confused.  Your neck is stiff.  You have trouble breathing. This information is not intended to replace advice given to you by your health care provider. Make sure you discuss any questions you have with your health care provider. Document Released: 04/14/2008 Document Revised: 06/22/2016 Document Reviewed: 08/22/2015 Elsevier Interactive Patient Education  [employer].

## 2017-12-29 NOTE — Progress Notes (Signed)
Brenda Rosales , [birth year], 32 y.o., female MRN: [id number] Patient Care Team    Relationship Specialty Notifications Start End  Ma Hillock, DO PCP - General Family Medicine  04/23/16    Comment: Patient request  Nunzio Cobbs, MD Consulting Physician Obstetrics and Gynecology  04/23/16   Deneise Lever, MD Consulting Physician Pulmonary Disease  04/23/16   Irene Shipper, MD Consulting Physician Gastroenterology  04/23/16   Danice Goltz, MD Consulting Physician Ophthalmology  02/10/17     Chief Complaint  Patient presents with  . URI    congestion,runny nose, low grade fever x 4 days     Subjective: Pt presents for an OV with complaints of fever of 3-4 days duration.  Associated symptoms include congestion, fever, runny nose, headache, fatigue. She has had her flu shot this year. She endorses vomit x1, drainage only. She is tolerating PO.  Pt has tried nothing to ease their symptoms.   Depression screen [facility] 2/9 11/30/2017 08/14/2017 04/23/2016 06/19/2015 06/15/2015  Decreased Interest 0 0 0 0 0  Down, Depressed, Hopeless 0 0 0 0 0  PHQ - 2 Score 0 0 0 0 0    Allergies  Allergen Reactions  . Vancomycin Other (See Comments)    redmans syndrome    Social History   Tobacco Use  . Smoking status: Never Smoker  . Smokeless tobacco: Never Used  Substance Use Topics  . Alcohol use: Yes    Alcohol/week: 0.6 oz    Types: 1 Standard drinks or equivalent per week    Comment: SOCIALLY   Past Medical History:  Diagnosis Date  . Allergy   . Anxiety   . ASTHMA 11/19/2007  . CHICKENPOX 11/19/2007  . Diabetes mellitus without complication (HCC)    diet controlled  . Duodenitis   . Dysmenorrhea 11/19/2007  . DYSPHAGIA 11/19/2010  . ECZEMA 11/19/2007  . Esophageal stricture   . Esophagitis   . FATTY LIVER DISEASE 04/17/2008  . GERD 11/20/2010  . Hyperlipidemia   . IBS (irritable bowel syndrome)   . Infection of eyelid 08/07/2011  . Low vitamin D level 2018  .  Other and unspecified noninfectious gastroenteritis and colitis(558.9) 02/2014  . RHINOSINUSITIS, CHRONIC 12/09/2007  . TB SKIN TEST, POSITIVE 11/19/2007   Past Surgical History:  Procedure Laterality Date  . CT SINUS LTD W/O CM  09/23/07  . Mole excision     x's 4   Family History  Adopted: Yes   Allergies as of 12/29/2017      Reactions   Vancomycin Other (See Comments)   redmans syndrome       Medication List        Accurate as of 12/29/17  9:23 AM. Always use your most recent med list.          albuterol 108 (90 Base) MCG/ACT inhaler Commonly known as:  PROVENTIL HFA;VENTOLIN HFA Inhale 2 puffs into the lungs every 6 (six) hours as needed for wheezing or shortness of breath.   EPINEPHrine 0.3 mg/0.3 mL Soaj injection Commonly known as:  EPI-PEN Inject 0.3 mg into the muscle once.   levocetirizine 5 MG tablet Commonly known as:  XYZAL Take 1 tablet (5 mg total) by mouth every evening.   metFORMIN 1000 MG tablet Commonly known as:  GLUCOPHAGE Take 1 tablet (1,000 mg total) by mouth 2 (two) times daily with a meal.   norethindrone-ethinyl estradiol 0.4-35 MG-MCG tablet Commonly known as:  BALZIVA Take 1  tablet by mouth daily.   SYMBICORT 160-4.5 MCG/ACT inhaler Generic drug:  budesonide-formoterol Inhale 2 puffs into the lungs 2 (two) times daily.   tacrolimus 0.1 % ointment Commonly known as:  PROTOPIC Apply topically 2 (two) times daily.       All past medical history, surgical history, allergies, family history, immunizations andmedications were updated in the EMR today and reviewed under the history and medication portions of their EMR.     ROS: Negative, with the exception of above mentioned in HPI   Objective:  BP 115/79 (BP Location: Left Arm, Patient Position: Sitting, Cuff Size: Normal)   Pulse 64   Temp 98.7 F (37.1 C)   Resp 20   Wt 156 lb 4 oz (70.9 kg)   SpO2 98%   BMI 27.68 kg/m  Body mass index is 27.68 kg/m. Gen: Afebrile. No  acute distress. Nontoxic in appearance, well developed, well nourished.  HENT: AT. [place]. Bilateral TM visualized with erythema left, bilateral fullness. MMM, no oral lesions. Bilateral nares erythema, drainage and sweeling. Throat without erythema or exudates. PND, cough, hoarseness present.  Eyes:Pupils Equal Round Reactive to light, Extraocular movements intact,  Conjunctiva without redness, discharge or icterus. Neck/lymp/endocrine: Supple,no lymphadenopathy CV: RRR  Chest: CTAB, no wheeze or crackles. Good air movement, normal resp effort.  Abd: Soft. NTND. BS present. no Masses palpated. No rebound or guarding.  Skin: no rashes, purpura or petechiae.  Neuro: Normal gait. PERLA. EOMi. Alert. Oriented x3   No exam data present No results found. Results for orders placed or performed in visit on 12/29/17 (from the past 24 hour(s))  POC Influenza A&B (Binax test)     Status: Normal   Collection Time: 12/29/17  9:21 AM  Result Value Ref Range   Influenza A, POC Negative Negative   Influenza B, POC Negative Negative    Assessment/Plan: Brenda Rosales is a 32 y.o. female present for OV for  Flu-like symptoms - POC Influenza A&B (Binax test)--> negative Acute non-recurrent frontal sinusitis Rest, hydrate.  +/- flonase, mucinex (DM if cough), nettie pot or nasal saline.  amoxicillin-clavulanate (AUGMENTIN) prescribed, take until completed.  If cough present it can last up to 6-8 weeks.  F/U 2 weeks of not improved.    Reviewed expectations re: course of current medical issues.  Discussed self-management of symptoms.  Outlined signs and symptoms indicating need for more acute intervention.  Patient verbalized understanding and all questions were answered.  Patient received an After-Visit Summary.    Orders Placed This Encounter  Procedures  . POC Influenza A&B (Binax test)     Note is dictated utilizing voice recognition software. Although note has been proof read prior  to signing, occasional typographical errors still can be missed. If any questions arise, please do not hesitate to call for verification.   electronically signed by:  Howard Pouch, DO  [place]

## 2018-01-01 ENCOUNTER — Other Ambulatory Visit: Payer: Self-pay

## 2018-01-01 ENCOUNTER — Other Ambulatory Visit: Payer: BC Managed Care – PPO

## 2018-01-01 DIAGNOSIS — E559 Vitamin D deficiency, unspecified: Secondary | ICD-10-CM

## 2018-01-04 ENCOUNTER — Other Ambulatory Visit (INDEPENDENT_AMBULATORY_CARE_PROVIDER_SITE_OTHER): Payer: BC Managed Care – PPO

## 2018-01-04 DIAGNOSIS — E559 Vitamin D deficiency, unspecified: Secondary | ICD-10-CM

## 2018-01-05 LAB — VITAMIN D 25 HYDROXY (VIT D DEFICIENCY, FRACTURES): Vit D, 25-Hydroxy: 42.9 ng/mL (ref 30.0–100.0)

## 2018-01-08 ENCOUNTER — Other Ambulatory Visit: Payer: Self-pay | Admitting: Obstetrics and Gynecology

## 2018-02-25 ENCOUNTER — Other Ambulatory Visit: Payer: Self-pay

## 2018-02-25 ENCOUNTER — Encounter: Payer: Self-pay | Admitting: Obstetrics and Gynecology

## 2018-02-25 ENCOUNTER — Ambulatory Visit: Payer: BC Managed Care – PPO | Admitting: Obstetrics and Gynecology

## 2018-02-25 VITALS — BP 116/58 | HR 68 | Resp 16 | Ht 63.25 in | Wt 153.0 lb

## 2018-02-25 DIAGNOSIS — R7989 Other specified abnormal findings of blood chemistry: Secondary | ICD-10-CM | POA: Diagnosis not present

## 2018-02-25 DIAGNOSIS — Z01419 Encounter for gynecological examination (general) (routine) without abnormal findings: Secondary | ICD-10-CM | POA: Diagnosis not present

## 2018-02-25 MED ORDER — ETONOGESTREL-ETHINYL ESTRADIOL 0.12-0.015 MG/24HR VA RING: 1.0000 | VAGINAL_RING | VAGINAL | 3 refills | Status: DC

## 2018-02-25 NOTE — Patient Instructions (Signed)

## 2018-02-25 NOTE — Progress Notes (Signed)
32 y.o. G0P0000 Single Hispanic female here for annual exam.    Taking OCPs and finds it inconvenient.  Menses regular.  Wants to go back to the NuvaRing.   Last vit D 42.9 on 01/04/18. Currently not taking over the counter vit D.   No new partner.  Declines STD testing.   Does routine labs and hgbA1C with her PCP.   PCP:  Dr. Howard Pouch  Patient's last menstrual period was 02/08/2018.           Sexually active: Yes.    The current method of family planning is OCP (estrogen/progesterone).    Exercising: Yes.    running, weight lifting Smoker:  no  Health Maintenance: Pap:  02-21-16 Neg:Neg HR HPV History of abnormal Pap:  Yes,abnormal pap 2011 with colposcopy but no treatment to cervix MMG:  n/a Colonoscopy:  04/2008 for abdominal pain with Dr. Perry:normal BMD:   n/a  Result  n/a TDaP:  2013 Gardasil:   Yes, per patient completed series HIV and Hep C: 02/20/17 Negative Screening Labs: PCP     reports that she has never smoked. She has never used smokeless tobacco. She reports that she drinks about 0.6 oz of alcohol per week. She reports that she does not use drugs.  Past Medical History:  Diagnosis Date  . Allergy   . Anxiety   . ASTHMA 11/19/2007  . CHICKENPOX 11/19/2007  . Diabetes mellitus without complication (HCC)    diet controlled  . Duodenitis   . Dysmenorrhea 11/19/2007  . DYSPHAGIA 11/19/2010  . ECZEMA 11/19/2007  . Esophageal stricture   . Esophagitis   . FATTY LIVER DISEASE 04/17/2008  . GERD 11/20/2010  . Hyperlipidemia   . IBS (irritable bowel syndrome)   . Infection of eyelid 08/07/2011  . Low vitamin D level 2018  . Other and unspecified noninfectious gastroenteritis and colitis(558.9) 02/2014  . RHINOSINUSITIS, CHRONIC 12/09/2007  . TB SKIN TEST, POSITIVE 11/19/2007    Past Surgical History:  Procedure Laterality Date  . CT SINUS LTD W/O CM  09/23/07  . Mole excision     x's 4    Current Outpatient Medications  Medication Sig Dispense Refill  .  albuterol (PROVENTIL HFA;VENTOLIN HFA) 108 (90 BASE) MCG/ACT inhaler Inhale 2 puffs into the lungs every 6 (six) hours as needed for wheezing or shortness of breath. 1 Inhaler 11  . EPINEPHrine 0.3 mg/0.3 mL IJ SOAJ injection Inject 0.3 mg into the muscle once.    Marland Kitchen levocetirizine (XYZAL) 5 MG tablet Take 1 tablet (5 mg total) by mouth every evening. 90 tablet 3  . metFORMIN (GLUCOPHAGE) 1000 MG tablet Take 1 tablet (1,000 mg total) by mouth 2 (two) times daily with a meal. 180 tablet 1  . norethindrone-ethinyl estradiol (BALZIVA) 0.4-35 MG-MCG tablet Take 1 tablet by mouth daily. 3 Package 3  . SYMBICORT 160-4.5 MCG/ACT inhaler Inhale 2 puffs into the lungs 2 (two) times daily.  3  . tacrolimus (PROTOPIC) 0.1 % ointment Apply topically 2 (two) times daily. (Patient taking differently: Apply 1 application topically 2 (two) times daily as needed (for rash, skin issues). ) 100 g 1   No current facility-administered medications for this visit.     Family History  Adopted: Yes    Review of Systems  Constitutional: Negative.   HENT: Negative.   Eyes: Negative.   Respiratory: Negative.   Cardiovascular: Negative.   Gastrointestinal: Negative.   Endocrine: Negative.   Genitourinary: Negative.   Musculoskeletal: Negative.  Skin: Negative.   Allergic/Immunologic: Negative.   Neurological: Negative.   Hematological: Negative.   Psychiatric/Behavioral: Negative.     Exam:   BP (!) 116/58 (BP Location: Right Arm, Patient Position: Sitting, Cuff Size: Normal)   Pulse 68   Resp 16   Ht 5' 3.25" (1.607 m)   Wt 153 lb (69.4 kg)   LMP 02/08/2018   BMI 26.89 kg/m     General appearance: alert, cooperative and appears stated age Head: Normocephalic, without obvious abnormality, atraumatic Neck: no adenopathy, supple, symmetrical, trachea midline and thyroid normal to inspection and palpation Lungs: clear to auscultation bilaterally Breasts: normal appearance, no masses or tenderness, No  nipple retraction or dimpling, No nipple discharge or bleeding, No axillary or supraclavicular adenopathy Heart: regular rate and rhythm Abdomen: soft, non-tender; no masses, no organomegaly Extremities: extremities normal, atraumatic, no cyanosis or edema Skin: Skin color, texture, turgor normal. No rashes or lesions Lymph nodes: Cervical, supraclavicular, and axillary nodes normal. No abnormal inguinal nodes palpated Neurologic: Grossly normal  Pelvic: External genitalia:  no lesions              Urethra:  normal appearing urethra with no masses, tenderness or lesions              Bartholins and Skenes: normal                 Vagina: normal appearing vagina with normal color and discharge, no lesions              Cervix: no lesions              Pap taken: No. Bimanual Exam:  Uterus:  normal size, contour, position, consistency, mobility, non-tender              Adnexa: no mass, fullness, tenderness       Chaperone was present for exam.  Assessment:   Well woman visit with normal exam. Remote hx LGSIL.  Hx low vit D. Adopted.  No knowledge of family medical history.  Plan: Mammogram screening. Recommended self breast awareness. Pap and HR HPV next year. Guidelines for Calcium, Vitamin D, regular exercise program including cardiovascular and weight bearing exercise. Vit D check.  Switch from OCPs to NuvaRing.  Follow up annually and prn.   After visit summary provided.

## 2018-02-26 LAB — VITAMIN D 25 HYDROXY (VIT D DEFICIENCY, FRACTURES): VIT D 25 HYDROXY: 22.9 ng/mL — AB (ref 30.0–100.0)

## 2018-03-01 ENCOUNTER — Ambulatory Visit: Payer: BC Managed Care – PPO | Admitting: Family Medicine

## 2018-03-03 ENCOUNTER — Ambulatory Visit: Payer: BC Managed Care – PPO | Admitting: Family Medicine

## 2018-03-03 ENCOUNTER — Encounter: Payer: Self-pay | Admitting: Family Medicine

## 2018-03-03 VITALS — BP 109/72 | HR 76 | Temp 98.2°F | Ht 63.25 in | Wt 153.8 lb

## 2018-03-03 DIAGNOSIS — E119 Type 2 diabetes mellitus without complications: Secondary | ICD-10-CM

## 2018-03-03 DIAGNOSIS — E663 Overweight: Secondary | ICD-10-CM

## 2018-03-03 LAB — POCT GLYCOSYLATED HEMOGLOBIN (HGB A1C): Hemoglobin A1C: 6.7

## 2018-03-03 MED ORDER — METFORMIN HCL 1000 MG PO TABS: 1000.0000 mg | ORAL_TABLET | Freq: Two times a day (BID) | ORAL | 1 refills | Status: DC

## 2018-03-03 NOTE — Progress Notes (Signed)
Patient ID: Brenda Rosales, female   DOB: 1986-01-27, 32 y.o.   MRN: [id number]      Patient ID: Brenda Rosales, female  DOB: May 11, 1986, 32 y.o.   MRN: [id number]  Subjective:  Brenda Rosales is a 32 y.o. female present for follow up on uncontrolled diabetes.  Patient Care Team    Relationship Specialty Notifications Start End  Ma Hillock, DO PCP - General Family Medicine  04/23/16    Comment: Patient request  Nunzio Cobbs, MD Consulting Physician Obstetrics and Gynecology  04/23/16   Deneise Lever, MD Consulting Physician Pulmonary Disease  04/23/16   Irene Shipper, MD Consulting Physician Gastroenterology  04/23/16   Danice Goltz, MD Consulting Physician Ophthalmology  02/10/17    Diabetes:  Pt reports compliance with metformin 1000 mg BID. Patient denies numbness, tingling in the extremities or nonhealing wounds of feet.   Pt reports BG ranges does not check.  PNA series: Pna23 due in 10/2017. Prevanr completed 10/2016 Flu shot: utd 2018 (recommneded yearly) BMP: UTD 02/19/2017. WNL--> collect next visit with CBC, TSH and lipids.  Urine microalbuminuria: 05/2017 Foot exam: completed 05/2017 UTD Eye exam: Dr. Manuella Ghazi, UTD 02/2017 A1c: 6.0 --> 6.6--> 7.4 --> 6.7 today   Recent Results (from the past 2160 hour(s))  POC Influenza A&B (Binax test)     Status: Normal   Collection Time: 12/29/17  9:21 AM  Result Value Ref Range   Influenza A, POC Negative Negative   Influenza B, POC Negative Negative  VITAMIN D 25 Hydroxy (Vit-D Deficiency, Fractures)     Status: None   Collection Time: 01/04/18  4:21 PM  Result Value Ref Range   Vit D, 25-Hydroxy 42.9 30.0 - 100.0 ng/mL    Comment: Vitamin D deficiency has been defined by the [place] practice guideline as a level of serum 25-OH vitamin D less than 20 ng/mL (1,2). The Endocrine Society went on to further define vitamin D insufficiency as a level between 21 and 29  ng/mL (2). 1. IOM (Institute of Medicine). 2010. Dietary reference    intakes for calcium and D. [place]: The    [employer]. 2. Holick MF, Binkley Finzel, Bischoff-Ferrari HA, et al.    Evaluation, treatment, and prevention of vitamin D    deficiency: an Endocrine Society clinical practice    guideline. JCEM. 2011 Jul; 96(7):1911-30.   VITAMIN D 25 Hydroxy (Vit-D Deficiency, Fractures)     Status: Abnormal   Collection Time: 02/25/18  4:06 PM  Result Value Ref Range   Vit D, 25-Hydroxy 22.9 (L) 30.0 - 100.0 ng/mL    Comment: Vitamin D deficiency has been defined by the [place] practice guideline as a level of serum 25-OH vitamin D less than 20 ng/mL (1,2). The Endocrine Society went on to further define vitamin D insufficiency as a level between 21 and 29 ng/mL (2). 1. IOM (Institute of Medicine). 2010. Dietary reference    intakes for calcium and D. [place]: The    [employer]. 2. Holick MF, Binkley Waldo, Bischoff-Ferrari HA, et al.    Evaluation, treatment, and prevention of vitamin D    deficiency: an Endocrine Society clinical practice    guideline. JCEM. 2011 Jul; 96(7):1911-30.   POCT HgB A1C     Status: Abnormal   Collection Time: 03/03/18  8:07 AM  Result Value Ref Range   Hemoglobin A1C 6.7  Immunization History  Administered Date(s) Administered  . Influenza Whole 08/19/2009  . Influenza,inj,Quad PF,6+ Mos 07/31/2016  . Influenza-Unspecified 08/11/2015, 07/12/2017  . Pneumococcal Conjugate-13 11/06/2016  . Pneumococcal Polysaccharide-23 07/23/2007  . Td 09/11/2004  . Tdap 07/04/2012     Past Medical History:  Diagnosis Date  . Allergy   . Anxiety   . ASTHMA 11/19/2007  . CHICKENPOX 11/19/2007  . Diabetes mellitus without complication (HCC)    diet controlled  . Duodenitis   . Dysmenorrhea 11/19/2007  . DYSPHAGIA 11/19/2010  . ECZEMA 11/19/2007  . Esophageal stricture   . Esophagitis    . FATTY LIVER DISEASE 04/17/2008  . GERD 11/20/2010  . Hyperlipidemia   . IBS (irritable bowel syndrome)   . Infection of eyelid 08/07/2011  . Low vitamin D level 2018  . Other and unspecified noninfectious gastroenteritis and colitis(558.9) 02/2014  . RHINOSINUSITIS, CHRONIC 12/09/2007  . TB SKIN TEST, POSITIVE 11/19/2007   Allergies  Allergen Reactions  . Vancomycin Other (See Comments)    redmans syndrome    Past Surgical History:  Procedure Laterality Date  . CT SINUS LTD W/O CM  09/23/07  . Mole excision     x's 4   Family History  Adopted: Yes   Social History   Socioeconomic History  . Marital status: Single    Spouse name: Not on file  . Number of children: 0  . Years of education: Not on file  . Highest education level: Not on file  Occupational History  . Occupation: Product manager: [employer]  Social Needs  . Financial resource strain: Not on file  . Food insecurity:    Worry: Not on file    Inability: Not on file  . Transportation needs:    Medical: Not on file    Non-medical: Not on file  Tobacco Use  . Smoking status: Never Smoker  . Smokeless tobacco: Never Used  Substance and Sexual Activity  . Alcohol use: Yes    Alcohol/week: 0.6 oz    Types: 1 Standard drinks or equivalent per week    Comment: SOCIALLY  . Drug use: No  . Sexual activity: Yes    Partners: Male    Birth control/protection: Pill    Comment: Balziva  Lifestyle  . Physical activity:    Days per week: Not on file    Minutes per session: Not on file  . Stress: Not on file  Relationships  . Social connections:    Talks on phone: Not on file    Gets together: Not on file    Attends religious service: Not on file    Active member of club or organization: Not on file    Attends meetings of clubs or organizations: Not on file    Relationship status: Not on file  . Intimate partner violence:    Fear of current or ex partner: Not on file    Emotionally abused: Not on  file    Physically abused: Not on file    Forced sexual activity: Not on file  Other Topics Concern  . Not on file  Social History Narrative   Adopted from [place].    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   Drinks caffeine, uses herbal remedies   Wears her seatbelt, wears bicycle helmet   Reports routine exercise    Smoke detector in the home.   Feels safe in relationships.  ROS: Negative, with the exception of above mentioned in HPI  Objective: BP 109/72 (BP Location: Right Arm, Patient Position: Sitting, Cuff Size: Normal)   Pulse 76   Temp 98.2 F (36.8 C) (Oral)   Ht 5' 3.25" (1.607 m)   Wt 153 lb 12.8 oz (69.8 kg)   LMP 02/26/2018   SpO2 96%   BMI 27.03 kg/m  Gen: Afebrile. No acute distress.  HENT: AT. [place].  MMM.  Eyes:Pupils Equal Round Reactive to light, Extraocular movements intact,  Conjunctiva without redness, discharge or icterus. CV: RRR no murmur, no edema, +2/4 P posterior tibialis pulses Chest: CTAB, no wheeze or crackles Abd: Soft. NTND. BS present. no Masses palpated.  Skin: no rashes, purpura or petechiae.  Neuro:  Normal gait. PERLA. EOMi. Alert. Oriented x3 Psych: Normal affect, dress and demeanor. Normal speech. Normal thought content and judgment.   Results for orders placed or performed in visit on 03/03/18 (from the past 24 hour(s))  POCT HgB A1C     Status: Abnormal   Collection Time: 03/03/18  8:07 AM  Result Value Ref Range   Hemoglobin A1C 6.7     Assessment/plan: Brenda Rosales is a 32 y.o. female present for  Diabetes mellitus without complication (HCC)/overweight -  Controlled. Continue metformin BID. Refills provided today - Urine Microalbumin w/creat. Ratio 05/12/2017: WNL--> collect next visit - eye exam: now with DR. Manuella Ghazi, 02/09/2017 --> pt encouraged to make appt.  - foot exam completed 05/12/2017.--> complete next visit.  - flu shot UTD 2018 - BMP: 02/2017 WNL--> complete next  visit w/ fasting labs - PPSV 23 2008--> due 10/2017, prevnar 10/2016  - A1c 8.7--> 8.6--> 6.2--> 6.6 --> 6.0--> 6.6--> 7.4--> 6.7 today  -  Continue diet and exercise modifications.  - Follow-up every 3-4 months   Return in about 3 months (around 06/02/2018) for diabetes.   Electronically signed by: Howard Pouch, DO [place]

## 2018-03-03 NOTE — Patient Instructions (Signed)
Your a1c looks great! Keep up the good work.  I have refilled your medications. Hope you have a great week off.  Next visit fasting lab work.     Diabetes Mellitus and Exercise Exercising regularly is important for your overall health, especially when you have diabetes (diabetes mellitus). Exercising is not only about losing weight. It has many health benefits, such as increasing muscle strength and bone density and reducing body fat and stress. This leads to improved fitness, flexibility, and endurance, all of which result in better overall health. Exercise has additional benefits for people with diabetes, including:  Reducing appetite.  Helping to lower and control blood glucose.  Lowering blood pressure.  Helping to control amounts of fatty substances (lipids) in the blood, such as cholesterol and triglycerides.  Helping the body to respond better to insulin (improving insulin sensitivity).  Reducing how much insulin the body needs.  Decreasing the risk for heart disease by: ? Lowering cholesterol and triglyceride levels. ? Increasing the levels of good cholesterol. ? Lowering blood glucose levels.  What is my activity plan? Your health care provider or certified diabetes educator can help you make a plan for the type and frequency of exercise (activity plan) that works for you. Make sure that you:  Do at least 150 minutes of moderate-intensity or vigorous-intensity exercise each week. This could be brisk walking, biking, or water aerobics. ? Do stretching and strength exercises, such as yoga or weightlifting, at least 2 times a week. ? Spread out your activity over at least 3 days of the week.  Get some form of physical activity every day. ? Do not go more than 2 days in a row without some kind of physical activity. ? Avoid being inactive for more than 90 minutes at a time. Take frequent breaks to walk or stretch.  Choose a type of exercise or activity that you enjoy, and  set realistic goals.  Start slowly, and gradually increase the intensity of your exercise over time.  What do I need to know about managing my diabetes?  Check your blood glucose before and after exercising. ? If your blood glucose is higher than 240 mg/dL (13.3 mmol/L) before you exercise, check your urine for ketones. If you have ketones in your urine, do not exercise until your blood glucose returns to normal.  Know the symptoms of low blood glucose (hypoglycemia) and how to treat it. Your risk for hypoglycemia increases during and after exercise. Common symptoms of hypoglycemia can include: ? Hunger. ? Anxiety. ? Sweating and feeling clammy. ? Confusion. ? Dizziness or feeling light-headed. ? Increased heart rate or palpitations. ? Blurry vision. ? Tingling or numbness around the mouth, lips, or tongue. ? Tremors or shakes. ? Irritability.  Keep a rapid-acting carbohydrate snack available before, during, and after exercise to help prevent or treat hypoglycemia.  Avoid injecting insulin into areas of the body that are going to be exercised. For example, avoid injecting insulin into: ? The arms, when playing tennis. ? The legs, when jogging.  Keep records of your exercise habits. Doing this can help you and your health care provider adjust your diabetes management plan as needed. Write down: ? Food that you eat before and after you exercise. ? Blood glucose levels before and after you exercise. ? The type and amount of exercise you have done. ? When your insulin is expected to peak, if you use insulin. Avoid exercising at times when your insulin is peaking.  When you start  a new exercise or activity, work with your health care provider to make sure the activity is safe for you, and to adjust your insulin, medicines, or food intake as needed.  Drink plenty of water while you exercise to prevent dehydration or heat stroke. Drink enough fluid to keep your urine clear or pale  yellow. This information is not intended to replace advice given to you by your health care provider. Make sure you discuss any questions you have with your health care provider. Document Released: 01/17/2004 Document Revised: 05/16/2016 Document Reviewed: 04/07/2016 Elsevier Interactive Patient Education  2018 [employer].

## 2018-03-08 ENCOUNTER — Other Ambulatory Visit: Payer: Self-pay

## 2018-03-08 DIAGNOSIS — J301 Allergic rhinitis due to pollen: Secondary | ICD-10-CM

## 2018-03-08 MED ORDER — LEVOCETIRIZINE DIHYDROCHLORIDE 5 MG PO TABS: 5.0000 mg | ORAL_TABLET | Freq: Every evening | ORAL | 0 refills | Status: DC

## 2018-03-08 NOTE — Telephone Encounter (Signed)
Chart review. App made. ERx sent for 90 days/0 refill.

## 2018-05-06 ENCOUNTER — Encounter: Payer: Self-pay | Admitting: Family Medicine

## 2018-05-06 ENCOUNTER — Ambulatory Visit: Payer: BC Managed Care – PPO | Admitting: Family Medicine

## 2018-05-06 VITALS — BP 118/78 | HR 66 | Temp 98.2°F | Resp 20 | Ht 63.25 in | Wt 150.5 lb

## 2018-05-06 DIAGNOSIS — J0100 Acute maxillary sinusitis, unspecified: Secondary | ICD-10-CM

## 2018-05-06 DIAGNOSIS — J029 Acute pharyngitis, unspecified: Secondary | ICD-10-CM

## 2018-05-06 LAB — POCT RAPID STREP A (OFFICE): Rapid Strep A Screen: NEGATIVE

## 2018-05-06 MED ORDER — METHYLPREDNISOLONE ACETATE 80 MG/ML IJ SUSP
80.0000 mg | Freq: Once | INTRAMUSCULAR | Status: AC
  Administered 2018-05-06: 80 mg via INTRAMUSCULAR

## 2018-05-06 MED ORDER — AMOXICILLIN-POT CLAVULANATE 875-125 MG PO TABS: 1.0000 | ORAL_TABLET | Freq: Two times a day (BID) | ORAL | 0 refills | Status: DC

## 2018-05-06 NOTE — Progress Notes (Signed)
Brenda Rosales , 01-Nov-1986, 32 y.o., female MRN: [id number] Patient Care Team    Relationship Specialty Notifications Start End  Ma Hillock, DO PCP - General Family Medicine  04/23/16    Comment: Patient request  Nunzio Cobbs, MD Consulting Physician Obstetrics and Gynecology  04/23/16   Deneise Lever, MD Consulting Physician Pulmonary Disease  04/23/16   Irene Shipper, MD Consulting Physician Gastroenterology  04/23/16   Danice Goltz, MD Consulting Physician Ophthalmology  02/10/17     Chief Complaint  Patient presents with  . URI    sore throat,ears popping,non productive cough x 4 days     Subjective: Pt presents for an OV with complaints of Sore throat, popping ears ,  nonproductive cough, sinus pressure, headache and fatigue of 4-5 days duration.  She is taking the xyzal and flonase. She is also continuing her allergy shots.  She denies fever, chills, nausea or vomit.  Depression screen [facility] 2/9 11/30/2017 08/14/2017 04/23/2016 06/19/2015 06/15/2015  Decreased Interest 0 0 0 0 0  Down, Depressed, Hopeless 0 0 0 0 0  PHQ - 2 Score 0 0 0 0 0    Allergies  Allergen Reactions  . Vancomycin Other (See Comments)    redmans syndrome    Social History   Tobacco Use  . Smoking status: Never Smoker  . Smokeless tobacco: Never Used  Substance Use Topics  . Alcohol use: Yes    Alcohol/week: 0.6 oz    Types: 1 Standard drinks or equivalent per week    Comment: SOCIALLY   Past Medical History:  Diagnosis Date  . Allergy   . Anxiety   . ASTHMA 11/19/2007  . CHICKENPOX 11/19/2007  . Diabetes mellitus without complication (HCC)    diet controlled  . Duodenitis   . Dysmenorrhea 11/19/2007  . DYSPHAGIA 11/19/2010  . ECZEMA 11/19/2007  . Esophageal stricture   . Esophagitis   . FATTY LIVER DISEASE 04/17/2008  . GERD 11/20/2010  . Hyperlipidemia   . IBS (irritable bowel syndrome)   . Infection of eyelid 08/07/2011  . Low vitamin D level 2018  . Other and  unspecified noninfectious gastroenteritis and colitis(558.9) 02/2014  . RHINOSINUSITIS, CHRONIC 12/09/2007  . TB SKIN TEST, POSITIVE 11/19/2007   Past Surgical History:  Procedure Laterality Date  . CT SINUS LTD W/O CM  09/23/07  . Mole excision     x's 4   Family History  Adopted: Yes   Allergies as of 05/06/2018      Reactions   Vancomycin Other (See Comments)   redmans syndrome       Medication List        Accurate as of 05/06/18  9:24 AM. Always use your most recent med list.          albuterol 108 (90 Base) MCG/ACT inhaler Commonly known as:  PROVENTIL HFA;VENTOLIN HFA Inhale 2 puffs into the lungs every 6 (six) hours as needed for wheezing or shortness of breath.   EPINEPHrine 0.3 mg/0.3 mL Soaj injection Commonly known as:  EPI-PEN Inject 0.3 mg into the muscle once.   etonogestrel-ethinyl estradiol 0.12-0.015 MG/24HR vaginal ring Commonly known as:  Ina 1 each vaginally every 28 (twenty-eight) days. Insert vaginally and leave in place for 3 consecutive weeks, then remove for 1 week.   levocetirizine 5 MG tablet Commonly known as:  XYZAL Take 1 tablet (5 mg total) by mouth every evening.   metFORMIN 1000 MG tablet Commonly  known as:  GLUCOPHAGE Take 1 tablet (1,000 mg total) by mouth 2 (two) times daily with a meal.   SYMBICORT 160-4.5 MCG/ACT inhaler Generic drug:  budesonide-formoterol Inhale 2 puffs into the lungs 2 (two) times daily.   tacrolimus 0.1 % ointment Commonly known as:  PROTOPIC Apply topically 2 (two) times daily.       All past medical history, surgical history, allergies, family history, immunizations andmedications were updated in the EMR today and reviewed under the history and medication portions of their EMR.     ROS: Negative, with the exception of above mentioned in HPI   Objective:  BP 118/78 (BP Location: Right Arm, Patient Position: Sitting, Cuff Size: Normal)   Pulse 66   Temp 98.2 F (36.8 C)   Resp 20   Ht  5' 3.25" (1.607 m)   Wt 150 lb 8 oz (68.3 kg)   LMP 04/30/2018   SpO2 97%   BMI 26.45 kg/m  Body mass index is 26.45 kg/m. Gen: Afebrile. No acute distress. Nontoxic in appearance, well developed, well nourished.  HENT: AT. [place]. Bilateral TM visualized with fullness and erythema right . MMM, no oral lesions. severe swelling right nare, left nare with erythema no swelling. Drainage in both. Throat without erythema or exudates. Mild cough and hoarseness. TTP right mas sinus.  Eyes:Pupils Equal Round Reactive to light, Extraocular movements intact,  Conjunctiva without redness, discharge or icterus. Neck/lymp/endocrine: Supple,right ant cervical  lymphadenopathy CV: RRR  Chest: CTAB, no wheeze or crackles. Good air movement, normal resp effort.  Skin: no rashes, purpura or petechiae.  Neuro:  Normal gait. PERLA. EOMi. Alert. Oriented x3  No exam data present No results found. Results for orders placed or performed in visit on 05/06/18 (from the past 24 hour(s))  POCT rapid strep A     Status: Normal   Collection Time: 05/06/18  9:23 AM  Result Value Ref Range   Rapid Strep A Screen Negative Negative    Assessment/Plan: Gary Bultman is a 32 y.o. female present for OV for  Sore throat - POCT rapid strep A--. Negative Acute non-recurrent maxillary sinusitis - methylPREDNISolone acetate (DEPO-MEDROL) injection 80 mg Rest, hydrate.  + flonase, mucinex (DM if cough), nettie pot or nasal saline.  Augmentin prescribed, take until completed.  IM depo medrol today.  If cough present it can last up to 6-8 weeks.  F/U 2 weeks of not improved.    Reviewed expectations re: course of current medical issues.  Discussed self-management of symptoms.  Outlined signs and symptoms indicating need for more acute intervention.  Patient verbalized understanding and all questions were answered.  Patient received an After-Visit Summary.    Orders Placed This Encounter  Procedures  . POCT  rapid strep A     Note is dictated utilizing voice recognition software. Although note has been proof read prior to signing, occasional typographical errors still can be missed. If any questions arise, please do not hesitate to call for verification.   electronically signed by:  Howard Pouch, DO  [place]

## 2018-05-06 NOTE — Patient Instructions (Addendum)
Rest, hydrate.  + flonase, mucinex (DM if cough), nettie pot or nasal saline.  Augmentin prescribed, take until completed.  IM depo medrol today.  If cough present it can last up to 6-8 weeks.  F/U 2 weeks of not improved.   Sinusitis, Adult Sinusitis is soreness and inflammation of your sinuses. Sinuses are hollow spaces in the bones around your face. They are located:  Around your eyes.  In the middle of your forehead.  Behind your nose.  In your cheekbones.  Your sinuses and nasal passages are lined with a stringy fluid (mucus). Mucus normally drains out of your sinuses. When your nasal tissues get inflamed or swollen, the mucus can get trapped or blocked so air cannot flow through your sinuses. This lets bacteria, viruses, and funguses grow, and that leads to infection. Follow these instructions at home: Medicines  Take, use, or apply over-the-counter and prescription medicines only as told by your doctor. These may include nasal sprays.  If you were prescribed an antibiotic medicine, take it as told by your doctor. Do not stop taking the antibiotic even if you start to feel better. Hydrate and Humidify  Drink enough water to keep your pee (urine) clear or pale yellow.  Use a cool mist humidifier to keep the humidity level in your home above 50%.  Breathe in steam for 10-15 minutes, 3-4 times a day or as told by your doctor. You can do this in the bathroom while a hot shower is running.  Try not to spend time in cool or dry air. Rest  Rest as much as possible.  Sleep with your head raised (elevated).  Make sure to get enough sleep each night. General instructions  Put a warm, moist washcloth on your face 3-4 times a day or as told by your doctor. This will help with discomfort.  Wash your hands often with soap and water. If there is no soap and water, use hand sanitizer.  Do not smoke. Avoid being around people who are smoking (secondhand smoke).  Keep all  follow-up visits as told by your doctor. This is important. Contact a doctor if:  You have a fever.  Your symptoms get worse.  Your symptoms do not get better within 10 days. Get help right away if:  You have a very bad headache.  You cannot stop throwing up (vomiting).  You have pain or swelling around your face or eyes.  You have trouble seeing.  You feel confused.  Your neck is stiff.  You have trouble breathing. This information is not intended to replace advice given to you by your health care provider. Make sure you discuss any questions you have with your health care provider. Document Released: 04/14/2008 Document Revised: 06/22/2016 Document Reviewed: 08/22/2015 Elsevier Interactive Patient Education  [employer].

## 2018-06-03 ENCOUNTER — Ambulatory Visit: Payer: BC Managed Care – PPO | Admitting: Family Medicine

## 2018-06-07 ENCOUNTER — Other Ambulatory Visit: Payer: Self-pay

## 2018-06-07 DIAGNOSIS — J301 Allergic rhinitis due to pollen: Secondary | ICD-10-CM

## 2018-06-07 MED ORDER — LEVOCETIRIZINE DIHYDROCHLORIDE 5 MG PO TABS: 5.0000 mg | ORAL_TABLET | Freq: Every evening | ORAL | 0 refills | Status: DC

## 2018-06-09 ENCOUNTER — Ambulatory Visit: Payer: BC Managed Care – PPO | Admitting: Family Medicine

## 2018-06-09 ENCOUNTER — Encounter: Payer: Self-pay | Admitting: Family Medicine

## 2018-06-09 VITALS — BP 104/66 | HR 74 | Temp 98.0°F | Resp 20 | Ht 63.0 in | Wt 155.2 lb

## 2018-06-09 DIAGNOSIS — Z23 Encounter for immunization: Secondary | ICD-10-CM | POA: Diagnosis not present

## 2018-06-09 DIAGNOSIS — E663 Overweight: Secondary | ICD-10-CM | POA: Diagnosis not present

## 2018-06-09 DIAGNOSIS — E119 Type 2 diabetes mellitus without complications: Secondary | ICD-10-CM

## 2018-06-09 DIAGNOSIS — E781 Pure hyperglyceridemia: Secondary | ICD-10-CM | POA: Diagnosis not present

## 2018-06-09 LAB — POCT GLYCOSYLATED HEMOGLOBIN (HGB A1C): HBA1C, POC (CONTROLLED DIABETIC RANGE): 7.3 % — AB (ref 0.0–7.0)

## 2018-06-09 MED ORDER — METFORMIN HCL 1000 MG PO TABS: 1000.0000 mg | ORAL_TABLET | Freq: Two times a day (BID) | ORAL | 1 refills | Status: DC

## 2018-06-09 NOTE — Patient Instructions (Signed)
Your a1c went up to 7.3 today. Get back on the routine of exercise and your diet and I suspect it will come down by next visit.  We will call you with lab results once received (probably Tuesday).    Please help [place] help you:  We are honored you have chosen [facility] for your Primary Care home. Below you will find basic instructions that you may need to access in the future. Please help [place] help you by reading the instructions, which cover many of the frequent questions we experience.   Prescription refills and request:  -In order to allow more efficient response time, please call your pharmacy for all refills. They will forward the request electronically to [place]. This allows for the quickest possible response. Request left on a nurse line can take longer to refill, since these are checked as time allows between office patients and other phone calls.  - refill request can take up to 3-5 working days to complete.  - If request is sent electronically and request is appropiate, it is usually completed in 1-2 business days.  - all patients will need to be seen routinely for all chronic medical conditions requiring prescription medications (see follow-up below). If you are overdue for follow up on your condition, you will be asked to make an appointment and we will call in enough medication to cover you until your appointment (up to 30 days).  - all controlled substances will require a face to face visit to request/refill.  - if you desire your prescriptions to go through a new pharmacy, and have an active script at original pharmacy, you will need to call your pharmacy and have scripts transferred to new pharmacy. This is completed between the pharmacy locations and not by your provider.    Results: If any images or labs were ordered, it can take up to 1 week to get results depending on the test ordered and the lab/facility running and resulting the test. - Normal or stable results, which do not  need further discussion, may be released to your mychart immediately with attached note to you. A call may not be generated for normal results. Please make certain to sign up for mychart. If you have questions on how to activate your mychart you can call the front office.  - If your results need further discussion, our office will attempt to contact you via phone, and if unable to reach you after 2 attempts, we will release your abnormal result to your mychart with instructions.  - All results will be automatically released in mychart after 1 week.  - Your provider will provide you with explanation and instruction on all relevant material in your results. Please keep in mind, results and labs may appear confusing or abnormal to the untrained eye, but it does not mean they are actually abnormal for you personally. If you have any questions about your results that are not covered, or you desire more detailed explanation than what was provided, you should make an appointment with your provider to do so.   Our office handles many outgoing and incoming calls daily. If we have not contacted you within 1 week about your results, please check your mychart to see if there is a message first and if not, then contact our office.  In helping with this matter, you help decrease call volume, and therefore allow [place] to be able to respond to patients needs more efficiently.   Acute office visits (sick visit):  An acute visit is intended for a new problem and are scheduled in shorter time slots to allow schedule openings for patients with new problems. This is the appropriate visit to discuss a new problem. Problems will not be addressed by phone call or Echart message. Appointment is needed if requesting treatment. In order to provide you with excellent quality medical care with proper time for you to explain your problem, have an exam and receive treatment with instructions, these appointments should be limited to one new  problem per visit. If you experience a new problem, in which you desire to be addressed, please make an acute office visit, we save openings on the schedule to accommodate you. Please do not save your new problem for any other type of visit, let us take care of it properly and quickly for you.   Follow up visits:  Depending on your condition(s) your provider will need to see you routinely in order to provide you with quality care and prescribe medication(s). Most chronic conditions (Example: hypertension, Diabetes, depression/anxiety... etc), require visits a couple times a year. Your provider will instruct you on proper follow up for your personal medical conditions and history. Please make certain to make follow up appointments for your condition as instructed. Failing to do so could result in lapse in your medication treatment/refills. If you request a refill, and are overdue to be seen on a condition, we will always provide you with a 30 day script (once) to allow you time to schedule.    Medicare wellness (well visit): - we have a wonderful Nurse Maudie Mercury), that will meet with you and provide you will yearly medicare wellness visits. These visits should occur yearly (can not be scheduled less than 1 calendar year apart) and cover preventive health, immunizations, advance directives and screenings you are entitled to yearly through your medicare benefits. Do not miss out on your entitled benefits, this is when medicare will pay for these benefits to be ordered for you.  These are strongly encouraged by your provider and is the appropriate type of visit to make certain you are up to date with all preventive health benefits. If you have not had your medicare wellness exam in the last 12 months, please make certain to schedule one by calling the office and schedule your medicare wellness with Maudie Mercury as soon as possible.   Yearly physical (well visit):  - Adults are recommended to be seen yearly for physicals.  Check with your insurance and date of your last physical, most insurances require one calendar year between physicals. Physicals include all preventive health topics, screenings, medical exam and labs that are appropriate for gender/age and history. You may have fasting labs needed at this visit. This is a well visit (not a sick visit), new problems should not be covered during this visit (see acute visit).  - Pediatric patients are seen more frequently when they are younger. Your provider will advise you on well child visit timing that is appropriate for your their age. - This is not a medicare wellness visit. Medicare wellness exams do not have an exam portion to the visit. Some medicare companies allow for a physical, some do not allow a yearly physical. If your medicare allows a yearly physical you can schedule the medicare wellness with our nurse Maudie Mercury and have your physical with your provider after, on the same day. Please check with insurance for your full benefits.   Late Policy/No Shows:  - all new patients should arrive  15-30 minutes earlier than appointment to allow [place] time  to  obtain all personal demographics,  insurance information and for you to complete office paperwork. - All established patients should arrive 10-15 minutes earlier than appointment time to update all information and be checked in .  - In our best efforts to run on time, if you are late for your appointment you will be asked to either reschedule or if able, we will work you back into the schedule. There will be a wait time to work you back in the schedule,  depending on availability.  - If you are unable to make it to your appointment as scheduled, please call 24 hours ahead of time to allow [place] to fill the time slot with someone else who needs to be seen. If you do not cancel your appointment ahead of time, you may be charged a no show fee.

## 2018-06-09 NOTE — Progress Notes (Signed)
Patient ID: Brenda Rosales, female   DOB: [birth year], 32 y.o.   MRN: [id number]      Patient ID: Brenda Rosales, female  DOB: 07/27/1986, 32 y.o.   MRN: [id number]  Subjective:  Brenda Rosales is a 32 y.o. female present for follow up on uncontrolled diabetes.  Patient Care Team    Relationship Specialty Notifications Start End  Ma Hillock, DO PCP - General Family Medicine  04/23/16    Comment: Patient request  Nunzio Cobbs, MD Consulting Physician Obstetrics and Gynecology  04/23/16   Deneise Lever, MD Consulting Physician Pulmonary Disease  04/23/16   Irene Shipper, MD Consulting Physician Gastroenterology  04/23/16   Danice Goltz, MD Consulting Physician Ophthalmology  02/10/17    Diabetes/obesity:  Pt reports compliance  with metformin 1000 mg BID. Patient denies dizziness, hyperglycemic or hypoglycemic events. Patient denies numbness, tingling in the extremities or nonhealing wounds of feet.    Pt reports BG ranges does not check.  A1c: 6.0 --> 6.6--> 7.4 --> 6.7--> 7.3 today  Urine Microalbumin w/creat. Ratio 05/12/2017: WNL--> collect.06/09/18 - eye exam: now with DR. Manuella Ghazi, 02/09/2017 --> pt encouraged to make appt.  - foot exam completed 06/09/18 - flu shot UTD 2018 - BMP: 02/2017 WNL--> complete 06/09/18 - PPSV 23 completed 06/09/18, prevnar 10/2016    Recent Results (from the past 2160 hour(s))  POCT rapid strep A     Status: Normal   Collection Time: 05/06/18  9:23 AM  Result Value Ref Range   Rapid Strep A Screen Negative Negative  POCT glycosylated hemoglobin (Hb A1C)     Status: Abnormal   Collection Time: 06/09/18  2:42 PM  Result Value Ref Range   Hemoglobin A1C  4.0 - 5.6 %   HbA1c POC (<> result, manual entry)  4.0 - 5.6 %   HbA1c, POC (prediabetic range)  5.7 - 6.4 %   HbA1c, POC (controlled diabetic range) 7.3 (A) 0.0 - 7.0 %     Immunization History  Administered Date(s) Administered  . Influenza Whole 08/19/2009  .  Influenza,inj,Quad PF,6+ Mos 07/31/2016  . Influenza-Unspecified 08/11/2015, 07/12/2017  . Pneumococcal Conjugate-13 11/06/2016  . Pneumococcal Polysaccharide-23 07/23/2007  . Td 09/11/2004  . Tdap 07/04/2012     Past Medical History:  Diagnosis Date  . Allergy   . Anxiety   . ASTHMA 11/19/2007  . CHICKENPOX 11/19/2007  . Diabetes mellitus without complication (HCC)    diet controlled  . Duodenitis   . Dysmenorrhea 11/19/2007  . DYSPHAGIA 11/19/2010  . ECZEMA 11/19/2007  . Esophageal stricture   . Esophagitis   . FATTY LIVER DISEASE 04/17/2008  . GERD 11/20/2010  . Hyperlipidemia   . IBS (irritable bowel syndrome)   . Infection of eyelid 08/07/2011  . Low vitamin D level 2018  . Other and unspecified noninfectious gastroenteritis and colitis(558.9) 02/2014  . RHINOSINUSITIS, CHRONIC 12/09/2007  . TB SKIN TEST, POSITIVE 11/19/2007   Allergies  Allergen Reactions  . Vancomycin Other (See Comments)    redmans syndrome    Past Surgical History:  Procedure Laterality Date  . CT SINUS LTD W/O CM  09/23/07  . Mole excision     x's 4   Family History  Adopted: Yes   Social History   Socioeconomic History  . Marital status: Single    Spouse name: Not on file  . Number of children: 0  . Years of education: Not on file  . Highest education level: Not on  file  Occupational History  . Occupation: Product manager: [employer]  Social Needs  . Financial resource strain: Not on file  . Food insecurity:    Worry: Not on file    Inability: Not on file  . Transportation needs:    Medical: Not on file    Non-medical: Not on file  Tobacco Use  . Smoking status: Never Smoker  . Smokeless tobacco: Never Used  Substance and Sexual Activity  . Alcohol use: Yes    Alcohol/week: 0.6 oz    Types: 1 Standard drinks or equivalent per week    Comment: SOCIALLY  . Drug use: No  . Sexual activity: Yes    Partners: Male    Birth control/protection: Pill    Comment: Balziva    Lifestyle  . Physical activity:    Days per week: Not on file    Minutes per session: Not on file  . Stress: Not on file  Relationships  . Social connections:    Talks on phone: Not on file    Gets together: Not on file    Attends religious service: Not on file    Active member of club or organization: Not on file    Attends meetings of clubs or organizations: Not on file    Relationship status: Not on file  . Intimate partner violence:    Fear of current or ex partner: Not on file    Emotionally abused: Not on file    Physically abused: Not on file    Forced sexual activity: Not on file  Other Topics Concern  . Not on file  Social History Narrative   Adopted from [place].    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   Drinks caffeine, uses herbal remedies   Wears her seatbelt, wears bicycle helmet   Reports routine exercise    Smoke detector in the home.   Feels safe in relationships.       ROS: Negative, with the exception of above mentioned in HPI  Objective: BP 104/66 (BP Location: Left Arm, Patient Position: Sitting, Cuff Size: Normal)   Pulse 74   Temp 98 F (36.7 C)   Resp 20   Ht '5\' 3"'$  (1.6 m)   Wt 155 lb 4 oz (70.4 kg)   SpO2 97%   BMI 27.50 kg/m  Gen: Afebrile. No acute distress. Nontoxic - overweight female. HENT: AT. [place]. MMM.  Eyes:Pupils Equal Round Reactive to light, Extraocular movements intact,  Conjunctiva without redness, discharge or icterus. Neck/lymp/endocrine: Supple,no lymphadenopathy, no thyromegaly CV: RRR no murmur, no edema, +2/4 P posterior tibialis pulses Chest: CTAB, no wheeze or crackles Abd: Soft. NTND. BS present.  Skin: no rashes, purpura or petechiae.  Neuro: Normal gait. PERLA. EOMi. Alert. Oriented x3 Psych: Normal affect, dress and demeanor. Normal speech. Normal thought content and judgment.  Results for orders placed or performed in visit on 06/09/18 (from the past 24 hour(s))   POCT glycosylated hemoglobin (Hb A1C)     Status: Abnormal   Collection Time: 06/09/18  2:42 PM  Result Value Ref Range   Hemoglobin A1C  4.0 - 5.6 %   HbA1c POC (<> result, manual entry)  4.0 - 5.6 %   HbA1c, POC (prediabetic range)  5.7 - 6.4 %   HbA1c, POC (controlled diabetic range) 7.3 (A) 0.0 - 7.0 %    Assessment/plan: Brenda Rosales is a 32 y.o. female present for  Diabetes mellitus without complication (HCC)/overweight -  a1c increased today. She has gained a few ounds over the summer and has not been as good on her diet. School starts back in a few weeks so her routine will be back to normal. Continue metformin BID. Refills provided today - Urine Microalbumin w/creat. Ratio 05/12/2017: WNL--> collect.06/09/18 - eye exam: now with DR. Manuella Ghazi, 02/09/2017 --> pt encouraged to make appt.  - foot exam completed 06/09/18 - flu shot UTD 2018 - BMP: 02/2017 WNL--> complete 06/09/18 - PPSV 23 completed 06/09/18, prevnar 10/2016  - A1c 8.7--> 8.6--> 6.2--> 6.6 --> 6.0--> 6.6--> 7.4--> 6.7--> 7.3 today  -  Continue diet and exercise modifications.  - Follow-up every 3-4 months  Hypertriglyceridemia - diet and exercise.  - Comp Met (CMET) - TSH - Lipid panel  Return in about 3 months (around 09/09/2018) for DM.   Electronically signed by: Howard Pouch, DO [place]

## 2018-06-10 ENCOUNTER — Telehealth: Payer: Self-pay | Admitting: Family Medicine

## 2018-06-10 LAB — COMPREHENSIVE METABOLIC PANEL
AG RATIO: 1.4 (calc) (ref 1.0–2.5)
ALT: 18 U/L (ref 6–29)
AST: 14 U/L (ref 10–30)
Albumin: 4.2 g/dL (ref 3.6–5.1)
Alkaline phosphatase (APISO): 49 U/L (ref 33–115)
BILIRUBIN TOTAL: 0.5 mg/dL (ref 0.2–1.2)
BUN: 11 mg/dL (ref 7–25)
CALCIUM: 9.2 mg/dL (ref 8.6–10.2)
CO2: 22 mmol/L (ref 20–32)
Chloride: 101 mmol/L (ref 98–110)
Creat: 0.68 mg/dL (ref 0.50–1.10)
Globulin: 3.1 g/dL (calc) (ref 1.9–3.7)
Glucose, Bld: 296 mg/dL — ABNORMAL HIGH (ref 65–99)
Potassium: 3.8 mmol/L (ref 3.5–5.3)
SODIUM: 137 mmol/L (ref 135–146)
TOTAL PROTEIN: 7.3 g/dL (ref 6.1–8.1)

## 2018-06-10 LAB — LIPID PANEL
CHOL/HDL RATIO: 4.5 (calc) (ref <5.0)
CHOLESTEROL: 233 mg/dL — AB (ref <200)
HDL: 52 mg/dL (ref >50)
LDL Cholesterol (Calc): 138 mg/dL (calc) — ABNORMAL HIGH
Non-HDL Cholesterol (Calc): 181 mg/dL (calc) — ABNORMAL HIGH (ref <130)
TRIGLYCERIDES: 280 mg/dL — AB (ref <150)

## 2018-06-10 LAB — TSH: TSH: 1.9 mIU/L

## 2018-06-10 LAB — MICROALBUMIN / CREATININE URINE RATIO
Creatinine, Urine: 110 mg/dL (ref 20–275)
MICROALB/CREAT RATIO: 9 ug/mg{creat} (ref <30)
Microalb, Ur: 1 mg/dL

## 2018-06-10 NOTE — Telephone Encounter (Signed)
Spoke with patient reviewed lab results and instructions. Patient verbalized understanding. 

## 2018-06-10 NOTE — Telephone Encounter (Signed)
Please inform patient the following information: Her labs are all good with the exception her sugar at the time was > 296 and the triglyceride portion of her cholesterol was higher than desired.  Recs: routine exercise, high fiber diet and start a fish oil supplement ~2000mg  a day. All of the above with help decrease her triglycerides.

## 2018-07-26 LAB — HM DIABETES EYE EXAM

## 2018-07-27 ENCOUNTER — Encounter: Payer: Self-pay | Admitting: Family Medicine

## 2018-08-16 ENCOUNTER — Telehealth: Payer: Self-pay | Admitting: Family Medicine

## 2018-08-16 NOTE — Telephone Encounter (Signed)
Copied from [place]. 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

## 2018-08-17 NOTE — Telephone Encounter (Signed)
The only way to know for certain if she needs a booster is to perform titers for her.  If she knows she only had 1 MMR instead of 2 ( I do not have record of her MMRs), then she should go ahead and get a booster. Otherwise, titers recommended by appt

## 2018-08-17 NOTE — Telephone Encounter (Signed)
Spoke with patient she will try to get her immunization records to check if needed she will schedule an appointment.

## 2018-09-15 ENCOUNTER — Encounter: Payer: Self-pay | Admitting: Family Medicine

## 2018-09-15 ENCOUNTER — Ambulatory Visit: Payer: BC Managed Care – PPO | Admitting: Family Medicine

## 2018-09-15 VITALS — BP 109/74 | HR 70 | Resp 16 | Ht 63.0 in | Wt 158.0 lb

## 2018-09-15 DIAGNOSIS — Z0184 Encounter for antibody response examination: Secondary | ICD-10-CM | POA: Diagnosis not present

## 2018-09-15 DIAGNOSIS — E119 Type 2 diabetes mellitus without complications: Secondary | ICD-10-CM

## 2018-09-15 DIAGNOSIS — E663 Overweight: Secondary | ICD-10-CM

## 2018-09-15 LAB — POCT GLYCOSYLATED HEMOGLOBIN (HGB A1C): Hemoglobin A1C: 8 % — AB (ref 4.0–5.6)

## 2018-09-15 MED ORDER — SITAGLIP PHOS-METFORMIN HCL ER 100-1000 MG PO TB24: 1.0000 | ORAL_TABLET | Freq: Every day | ORAL | 1 refills | Status: DC

## 2018-09-15 NOTE — Patient Instructions (Signed)
Start janumet in place of metformin once daily.  Your a1c is 8.0.     Diabetes Mellitus and Nutrition When you have diabetes (diabetes mellitus), it is very important to have healthy eating habits because your blood sugar (glucose) levels are greatly affected by what you eat and drink. Eating healthy foods in the appropriate amounts, at about the same times every day, can help you:  Control your blood glucose.  Lower your risk of heart disease.  Improve your blood pressure.  Reach or maintain a healthy weight.  Every person with diabetes is different, and each person has different needs for a meal plan. Your health care provider may recommend that you work with a diet and nutrition specialist (dietitian) to make a meal plan that is best for you. Your meal plan may vary depending on factors such as:  The calories you need.  The medicines you take.  Your weight.  Your blood glucose, blood pressure, and cholesterol levels.  Your activity level.  Other health conditions you have, such as heart or kidney disease.  How do carbohydrates affect me? Carbohydrates affect your blood glucose level more than any other type of food. Eating carbohydrates naturally increases the amount of glucose in your blood. Carbohydrate counting is a method for keeping track of how many carbohydrates you eat. Counting carbohydrates is important to keep your blood glucose at a healthy level, especially if you use insulin or take certain oral diabetes medicines. It is important to know how many carbohydrates you can safely have in each meal. This is different for every person. Your dietitian can help you calculate how many carbohydrates you should have at each meal and for snack. Foods that contain carbohydrates include:  Bread, cereal, rice, pasta, and crackers.  Potatoes and corn.  Peas, beans, and lentils.  Milk and yogurt.  Fruit and juice.  Desserts, such as cakes, cookies, ice cream, and  candy.  How does alcohol affect me? Alcohol can cause a sudden decrease in blood glucose (hypoglycemia), especially if you use insulin or take certain oral diabetes medicines. Hypoglycemia can be a life-threatening condition. Symptoms of hypoglycemia (sleepiness, dizziness, and confusion) are similar to symptoms of having too much alcohol. If your health care provider says that alcohol is safe for you, follow these guidelines:  Limit alcohol intake to no more than 1 drink per day for nonpregnant women and 2 drinks per day for men. One drink equals 12 oz of beer, 5 oz of wine, or 1 oz of hard liquor.  Do not drink on an empty stomach.  Keep yourself hydrated with water, diet soda, or unsweetened iced tea.  Keep in mind that regular soda, juice, and other mixers may contain a lot of sugar and must be counted as carbohydrates.  What are tips for following this plan? Reading food labels  Start by checking the serving size on the label. The amount of calories, carbohydrates, fats, and other nutrients listed on the label are based on one serving of the food. Many foods contain more than one serving per package.  Check the total grams (g) of carbohydrates in one serving. You can calculate the number of servings of carbohydrates in one serving by dividing the total carbohydrates by 15. For example, if a food has 30 g of total carbohydrates, it would be equal to 2 servings of carbohydrates.  Check the number of grams (g) of saturated and trans fats in one serving. Choose foods that have low or no amount  of these fats.  Check the number of milligrams (mg) of sodium in one serving. Most people should limit total sodium intake to less than 2,300 mg per day.  Always check the nutrition information of foods labeled as "low-fat" or "nonfat". These foods may be higher in added sugar or refined carbohydrates and should be avoided.  Talk to your dietitian to identify your daily goals for nutrients listed  on the label. Shopping  Avoid buying canned, premade, or processed foods. These foods tend to be high in fat, sodium, and added sugar.  Shop around the outside edge of the grocery store. This includes fresh fruits and vegetables, bulk grains, fresh meats, and fresh dairy. Cooking  Use low-heat cooking methods, such as baking, instead of high-heat cooking methods like deep frying.  Cook using healthy oils, such as olive, canola, or sunflower oil.  Avoid cooking with butter, cream, or high-fat meats. Meal planning  Eat meals and snacks regularly, preferably at the same times every day. Avoid going long periods of time without eating.  Eat foods high in fiber, such as fresh fruits, vegetables, beans, and whole grains. Talk to your dietitian about how many servings of carbohydrates you can eat at each meal.  Eat 4-6 ounces of lean protein each day, such as lean meat, chicken, fish, eggs, or tofu. 1 ounce is equal to 1 ounce of meat, chicken, or fish, 1 egg, or 1/4 cup of tofu.  Eat some foods each day that contain healthy fats, such as avocado, nuts, seeds, and fish. Lifestyle   Check your blood glucose regularly.  Exercise at least 30 minutes 5 or more days each week, or as told by your health care provider.  Take medicines as told by your health care provider.  Do not use any products that contain nicotine or tobacco, such as cigarettes and e-cigarettes. If you need help quitting, ask your health care provider.  Work with a Social worker or diabetes educator to identify strategies to manage stress and any emotional and social challenges. What are some questions to ask my health care provider?  Do I need to meet with a diabetes educator?  Do I need to meet with a dietitian?  What number can I call if I have questions?  When are the best times to check my blood glucose? Where to find more information:  American Diabetes Association: diabetes.org/food-and-fitness/food  Academy  of Nutrition and Dietetics: PokerClues.dk  [employer] of Diabetes and Digestive and Kidney Diseases (NIH): ContactWire.be Summary  A healthy meal plan will help you control your blood glucose and maintain a healthy lifestyle.  Working with a diet and nutrition specialist (dietitian) can help you make a meal plan that is best for you.  Keep in mind that carbohydrates and alcohol have immediate effects on your blood glucose levels. It is important to count carbohydrates and to use alcohol carefully. This information is not intended to replace advice given to you by your health care provider. Make sure you discuss any questions you have with your health care provider. Document Released: 07/24/2005 Document Revised: 12/01/2016 Document Reviewed: 12/01/2016 Elsevier Interactive Patient Education  [employer].

## 2018-09-15 NOTE — Progress Notes (Signed)
Patient ID: Brenda Rosales, female   DOB: 1986-01-09, 32 y.o.   MRN: [id number]       Patient ID: Brenda Rosales, female  DOB: Dec 07, 1985, 32 y.o.   MRN: [id number]  Subjective:  Brenda Rosales is a 33 y.o. female present for follow up on uncontrolled diabetes.  Patient Care Team    Relationship Specialty Notifications Start End  Ma Hillock, DO PCP - General Family Medicine  04/23/16    Comment: Patient request  Nunzio Cobbs, MD Consulting Physician Obstetrics and Gynecology  04/23/16   Deneise Lever, MD Consulting Physician Pulmonary Disease  04/23/16   Irene Shipper, MD Consulting Physician Gastroenterology  04/23/16   Danice Goltz, MD Consulting Physician Ophthalmology  02/10/17    Diabetes/obesity:  Pt reports compliance  with metformin 1000 mg BID. Patient denies dizziness, hyperglycemic or hypoglycemic events. Patient denies numbness, tingling in the extremities or nonhealing wounds of feet.  Pt reports BG ranges does not check.  A1c: 6.0 --> 6.6--> 7.4 --> 6.7--> 7.3--> 8.0  today  Urine Microalbumin w/creat. Ratio 05/12/2017: WNL--> collect.06/09/18 - eye exam:  DR. Manuella Ghazi, 07/2018  - foot exam completed 06/09/18 - flu shot UTD 2019 - BMP: 05/12/2018 WNL - PPSV 23 completed 06/09/18, prevnar 10/2016    Recent Results (from the past 2160 hour(s))  HM DIABETES EYE EXAM     Status: None   Collection Time: 07/26/18 12:00 AM  Result Value Ref Range   HM Diabetic Eye Exam No Retinopathy No Retinopathy  HM DIABETES EYE EXAM     Status: None   Collection Time: 07/26/18  8:24 AM  Result Value Ref Range   HM Diabetic Eye Exam    POCT glycosylated hemoglobin (Hb A1C)     Status: Abnormal   Collection Time: 09/15/18  3:43 PM  Result Value Ref Range   Hemoglobin A1C 8.0 (A) 4.0 - 5.6 %   HbA1c POC (<> result, manual entry)     HbA1c, POC (prediabetic range)     HbA1c, POC (controlled diabetic range)       Immunization History  Administered Date(s)  Administered  . Influenza Whole 08/19/2009  . Influenza,inj,Quad PF,6+ Mos 07/31/2016  . Influenza-Unspecified 08/11/2015, 07/12/2017  . Pneumococcal Conjugate-13 11/06/2016  . Pneumococcal Polysaccharide-23 07/23/2007, 06/09/2018  . Td 09/11/2004  . Tdap 07/04/2012     Past Medical History:  Diagnosis Date  . Allergy   . Anxiety   . ASTHMA 11/19/2007  . CHICKENPOX 11/19/2007  . Diabetes mellitus without complication (HCC)    diet controlled  . Duodenitis   . Dysmenorrhea 11/19/2007  . DYSPHAGIA 11/19/2010  . ECZEMA 11/19/2007  . Esophageal stricture   . Esophagitis   . FATTY LIVER DISEASE 04/17/2008  . GERD 11/20/2010  . Hyperlipidemia   . IBS (irritable bowel syndrome)   . Infection of eyelid 08/07/2011  . Low vitamin D level 2018  . Other and unspecified noninfectious gastroenteritis and colitis(558.9) 02/2014  . RHINOSINUSITIS, CHRONIC 12/09/2007  . TB SKIN TEST, POSITIVE 11/19/2007   Allergies  Allergen Reactions  . Vancomycin Other (See Comments)    redmans syndrome    Past Surgical History:  Procedure Laterality Date  . CT SINUS LTD W/O CM  09/23/07  . Mole excision     x's 4   Family History  Adopted: Yes   Social History   Socioeconomic History  . Marital status: Single    Spouse name: Not on file  . Number of  children: 0  . Years of education: Not on file  . Highest education level: Not on file  Occupational History  . Occupation: Product manager: [employer]  Social Needs  . Financial resource strain: Not on file  . Food insecurity:    Worry: Not on file    Inability: Not on file  . Transportation needs:    Medical: Not on file    Non-medical: Not on file  Tobacco Use  . Smoking status: Never Smoker  . Smokeless tobacco: Never Used  Substance and Sexual Activity  . Alcohol use: Yes    Alcohol/week: 1.0 standard drinks    Types: 1 Standard drinks or equivalent per week    Comment: SOCIALLY  . Drug use: No  . Sexual activity: Yes     Partners: Male    Birth control/protection: Pill    Comment: Balziva  Lifestyle  . Physical activity:    Days per week: Not on file    Minutes per session: Not on file  . Stress: Not on file  Relationships  . Social connections:    Talks on phone: Not on file    Gets together: Not on file    Attends religious service: Not on file    Active member of club or organization: Not on file    Attends meetings of clubs or organizations: Not on file    Relationship status: Not on file  . Intimate partner violence:    Fear of current or ex partner: Not on file    Emotionally abused: Not on file    Physically abused: Not on file    Forced sexual activity: Not on file  Other Topics Concern  . Not on file  Social History Narrative   Adopted from [place].    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   Drinks caffeine, uses herbal remedies   Wears her seatbelt, wears bicycle helmet   Reports routine exercise    Smoke detector in the home.   Feels safe in relationships.       ROS: Negative, with the exception of above mentioned in HPI  Objective: BP 109/74 (BP Location: Right Arm, Patient Position: Sitting, Cuff Size: Normal)   Pulse 70   Resp 16   Ht _0  (1.6 m)   Wt 158 lb (71.7 kg)   SpO2 98%   BMI 27.99 kg/m  Gen: Afebrile. No acute distress.  Nontoxic and presentation.  Pleasant overweight female. HENT: AT. [place].  MMM.  Eyes:Pupils Equal Round Reactive to light, Extraocular movements intact,  Conjunctiva without redness, discharge or icterus. CV: RRR no murmur, no edema, +2/4 P posterior tibialis pulses Chest: CTAB, no wheeze or crackles Abd: Soft. NTND. BS present.  Neuro: Normal gait. PERLA. EOMi. Alert. Oriented.  Psych: Normal affect, dress and demeanor. Normal speech. Normal thought content and judgment.    Results for orders placed or performed in visit on 09/15/18 (from the past 24 hour(s))  POCT glycosylated hemoglobin (Hb  A1C)     Status: Abnormal   Collection Time: 09/15/18  3:43 PM  Result Value Ref Range   Hemoglobin A1C 8.0 (A) 4.0 - 5.6 %   HbA1c POC (<> result, manual entry)     HbA1c, POC (prediabetic range)     HbA1c, POC (controlled diabetic range)      Assessment/plan: Malikah Principato is a 32 y.o. female present for  Diabetes mellitus without complication (HCC)/overweight -  a1c increased today to 8.0. Switch from metformin twice daily to Janumet [phone] daily A1c: 6.0 --> 6.6--> 7.4 --> 6.7--> 7.3--> 8.0  today  Urine Microalbumin w/creat. Ratio 05/12/2017: WNL--> collect.06/09/18 - eye exam:  DR. Manuella Ghazi, 07/2018  - foot exam completed 06/09/18 - flu shot UTD 2019 - BMP: 05/12/2018 WNL - PPSV 23 completed 06/09/18, prevnar 10/2016  - Continue diet and exercise modifications.  - Follow-up every 3-4 months  Immunity status: Patient reports she is taking classes on [place] and was advised to check her MMR immunity status.  She states she has had 2 vaccinations against MMR. -MMR immunity labs ordered  Return in about 3 months (around 12/28/2018).   Electronically signed by: Howard Pouch, DO [place]

## 2018-09-16 LAB — MEASLES/MUMPS/RUBELLA IMMUNITY
Mumps IgG: 18.6 AU/mL
RUBEOLA IGG: 25 [AU]/ml — AB
Rubella: 0.94 index — ABNORMAL LOW

## 2018-10-01 IMAGING — CR DG FOREARM 2V*L*
2 series · 2 of 2 positions shown · non-contrast
Comparison: None.

CLINICAL DATA: 30-year-old who sustained a dog bite to the left
forearm earlier today. Initial encounter.

EXAM:
LEFT FOREARM - 2 VIEW

[x forearm ap left]
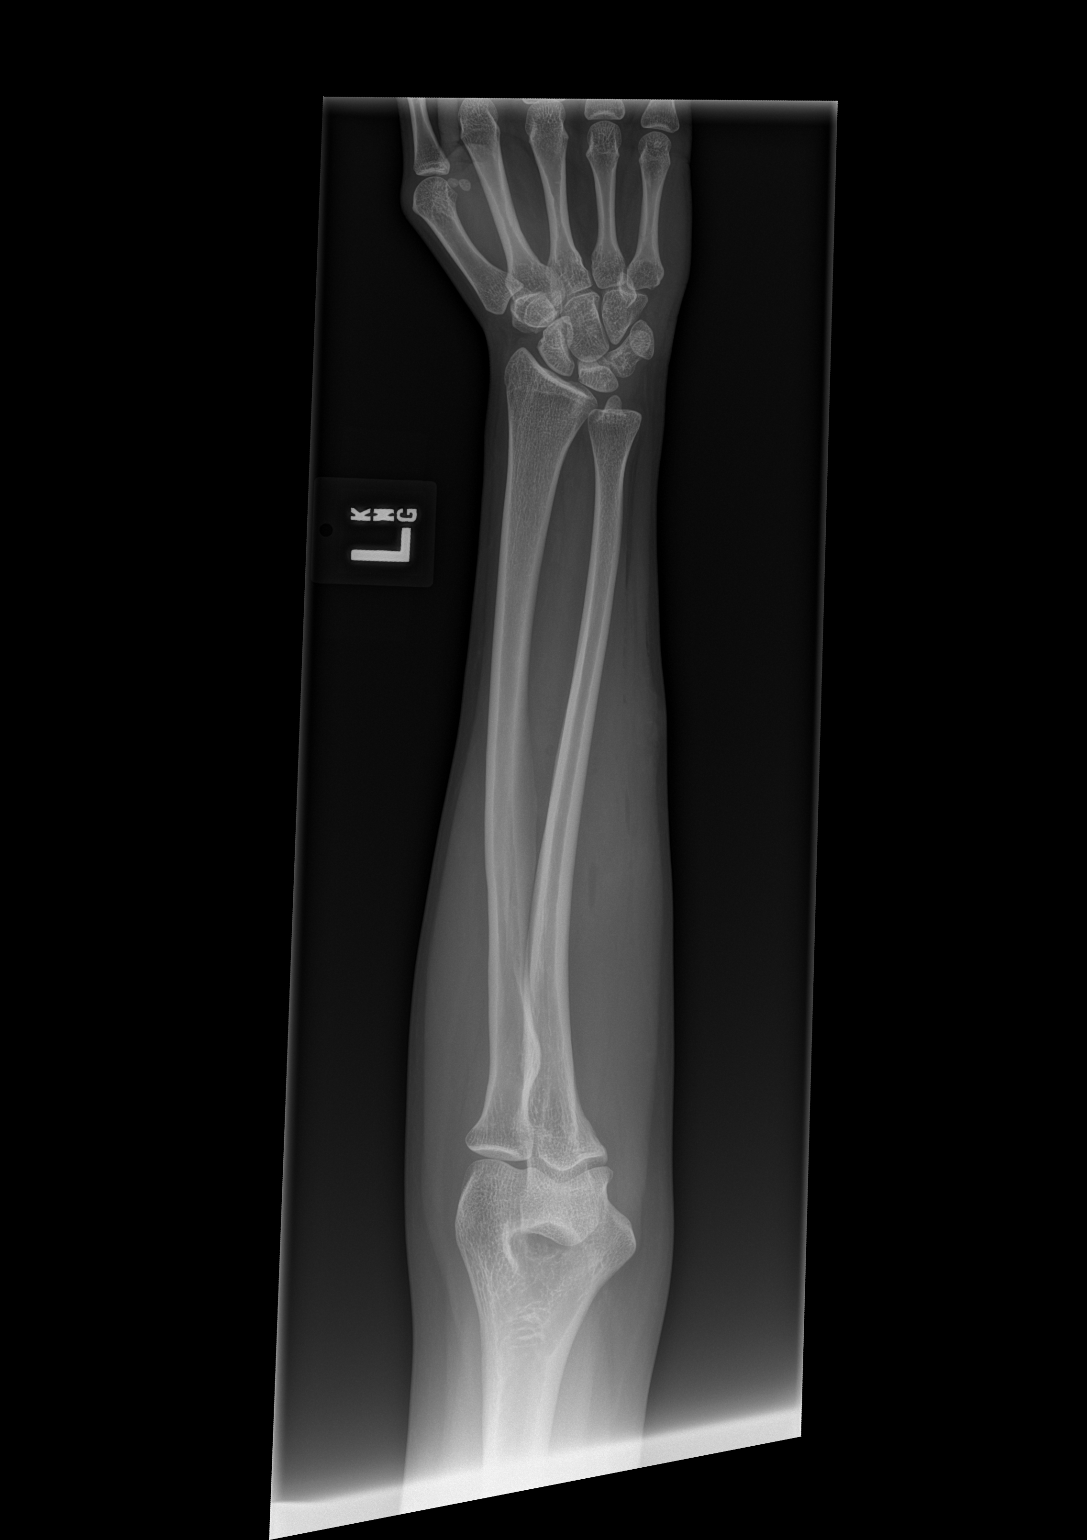

[x forearm lat left]
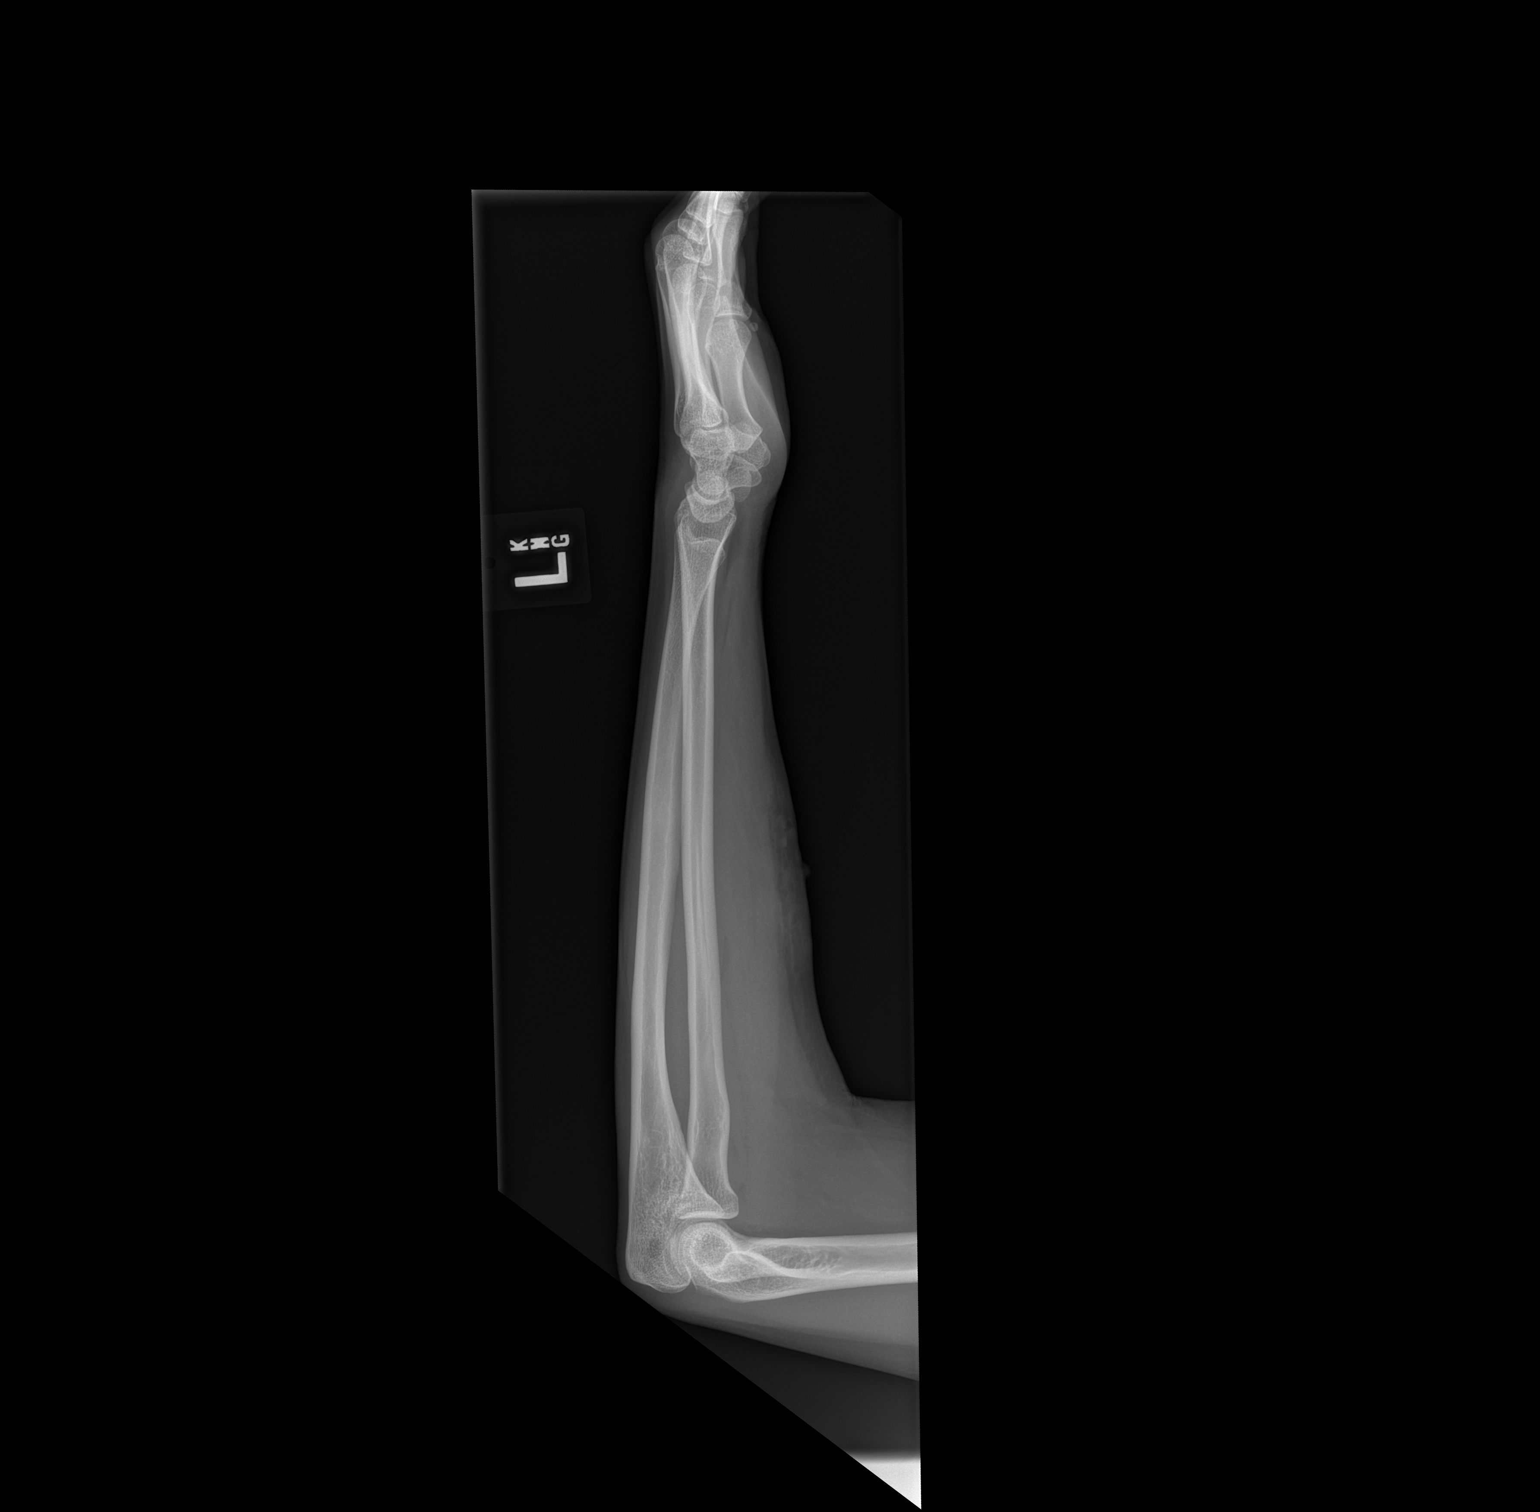

[2 of 2 positions shown; findings below may reference images not displayed]

FINDINGS: No evidence of acute fracture involving the radius or ulna.
Visualized wrist joint and elbow joint intact. Gas within the soft
tissues of the volar and medial forearm.
IMPRESSION: No osseous abnormality.  Gas within the soft tissues of the forearm.

## 2018-12-29 ENCOUNTER — Ambulatory Visit: Payer: BC Managed Care – PPO | Admitting: Family Medicine

## 2019-01-10 ENCOUNTER — Ambulatory Visit: Payer: BC Managed Care – PPO | Admitting: Family Medicine

## 2019-01-17 ENCOUNTER — Ambulatory Visit: Payer: BC Managed Care – PPO | Admitting: Family Medicine

## 2019-01-17 ENCOUNTER — Encounter: Payer: Self-pay | Admitting: Family Medicine

## 2019-01-17 VITALS — BP 111/73 | HR 72 | Temp 99.0°F | Resp 16 | Ht 63.0 in | Wt 157.1 lb

## 2019-01-17 DIAGNOSIS — E119 Type 2 diabetes mellitus without complications: Secondary | ICD-10-CM | POA: Diagnosis not present

## 2019-01-17 DIAGNOSIS — E663 Overweight: Secondary | ICD-10-CM

## 2019-01-17 LAB — POCT GLYCOSYLATED HEMOGLOBIN (HGB A1C)
HBA1C, POC (CONTROLLED DIABETIC RANGE): 8 % — AB (ref 0.0–7.0)
HBA1C, POC (PREDIABETIC RANGE): 8 % — AB (ref 5.7–6.4)
HEMOGLOBIN A1C: 8 % (ref 4.0–5.6)
Hemoglobin A1C: 8 % — AB (ref 4.0–5.6)

## 2019-01-17 MED ORDER — GLIPIZIDE 5 MG PO TABS: 5.0000 mg | ORAL_TABLET | Freq: Every day | ORAL | 1 refills | Status: DC

## 2019-01-17 MED ORDER — METFORMIN HCL 1000 MG PO TABS: 1000.0000 mg | ORAL_TABLET | Freq: Two times a day (BID) | ORAL | 1 refills | Status: DC

## 2019-01-17 NOTE — Progress Notes (Signed)
Patient ID: Brenda Rosales, female   DOB: [birth year], 33 y.o.   MRN: [id number]       Patient ID: Brenda Rosales, female  DOB: 03-22-1986, 33 y.o.   MRN: [id number]  Subjective:  Brenda Rosales is a 33 y.o. female present for follow up on uncontrolled diabetes.  Patient Care Team    Relationship Specialty Notifications Start End  Ma Hillock, DO PCP - General Family Medicine  04/23/16    Comment: Patient request  Nunzio Cobbs, MD Consulting Physician Obstetrics and Gynecology  04/23/16   Deneise Lever, MD Consulting Physician Pulmonary Disease  04/23/16   Irene Shipper, MD Consulting Physician Gastroenterology  04/23/16   Danice Goltz, MD Consulting Physician Ophthalmology  02/10/17    Diabetes/obesity:  Pt reports she is only taking metformin 1000 mg QD. She could not afford the janumet. She does not routinely check blood sugars at home.  A1c: 6.0 --> 6.6--> 7.4 --> 6.7--> 7.3--> 8.0--> 8.0 today    Urine Microalbumin w/creat. Ratio =collected 06/09/18 - eye exam:  DR. Manuella Ghazi, 07/2018   - foot exam completed 06/09/18 - flu shot UTD 2019 - BMP: 05/12/2018 WNL - PPSV 23 completed 06/09/18, prevnar 10/2016    Recent Results (from the past 2160 hour(s))  POCT glycosylated hemoglobin (Hb A1C)     Status: Abnormal   Collection Time: 01/17/19  3:43 PM  Result Value Ref Range   Hemoglobin A1C 8.0 (A) 4.0 - 5.6 %   HbA1c POC (<> result, manual entry) 8.0 4.0 - 5.6 %   HbA1c, POC (prediabetic range) 8.0 (A) 5.7 - 6.4 %   HbA1c, POC (controlled diabetic range) 8.0 (A) 0.0 - 7.0 %    Immunization History  Administered Date(s) Administered  . Influenza Inj Mdck Quad With Preservative 08/19/2018  . Influenza Whole 08/19/2009  . Influenza,inj,Quad PF,6+ Mos 07/31/2016  . Influenza-Unspecified 08/11/2015, 07/12/2017  . Pneumococcal Conjugate-13 11/06/2016  . Pneumococcal Polysaccharide-23 07/23/2007, 06/09/2018  . Td 09/11/2004  . Tdap 07/04/2012    Past  Medical History:  Diagnosis Date  . Allergy   . Anxiety   . ASTHMA 11/19/2007  . CHICKENPOX 11/19/2007  . Diabetes mellitus without complication (HCC)    diet controlled  . Duodenitis   . Dysmenorrhea 11/19/2007  . DYSPHAGIA 11/19/2010  . ECZEMA 11/19/2007  . Esophageal stricture   . Esophagitis   . FATTY LIVER DISEASE 04/17/2008  . GERD 11/20/2010  . Hyperlipidemia   . IBS (irritable bowel syndrome)   . Infection of eyelid 08/07/2011  . Low vitamin D level 2018  . Other and unspecified noninfectious gastroenteritis and colitis(558.9) 02/2014  . RHINOSINUSITIS, CHRONIC 12/09/2007  . TB SKIN TEST, POSITIVE 11/19/2007   Allergies  Allergen Reactions  . Vancomycin Other (See Comments)    redmans syndrome    Past Surgical History:  Procedure Laterality Date  . CT SINUS LTD W/O CM  09/23/07  . Mole excision     x's 4   Family History  Adopted: Yes   Social History   Socioeconomic History  . Marital status: Single    Spouse name: Not on file  . Number of children: 0  . Years of education: Not on file  . Highest education level: Not on file  Occupational History  . Occupation: Product manager: [employer]  Social Needs  . Financial resource strain: Not on file  . Food insecurity:    Worry: Not on file  Inability: Not on file  . Transportation needs:    Medical: Not on file    Non-medical: Not on file  Tobacco Use  . Smoking status: Never Smoker  . Smokeless tobacco: Never Used  Substance and Sexual Activity  . Alcohol use: Yes    Alcohol/week: 1.0 standard drinks    Types: 1 Standard drinks or equivalent per week    Comment: SOCIALLY  . Drug use: No  . Sexual activity: Yes    Partners: Male    Birth control/protection: Pill    Comment: Balziva  Lifestyle  . Physical activity:    Days per week: Not on file    Minutes per session: Not on file  . Stress: Not on file  Relationships  . Social connections:    Talks on phone: Not on file    Gets together:  Not on file    Attends religious service: Not on file    Active member of club or organization: Not on file    Attends meetings of clubs or organizations: Not on file    Relationship status: Not on file  . Intimate partner violence:    Fear of current or ex partner: Not on file    Emotionally abused: Not on file    Physically abused: Not on file    Forced sexual activity: Not on file  Other Topics Concern  . Not on file  Social History Narrative   Adopted from [place].    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   Drinks caffeine, uses herbal remedies   Wears her seatbelt, wears bicycle helmet   Reports routine exercise    Smoke detector in the home.   Feels safe in relationships.       ROS: Negative, with the exception of above mentioned in HPI  Objective: BP 111/73 (BP Location: Right Arm, Patient Position: Sitting, Cuff Size: Normal)   Pulse 72   Temp 99 F (37.2 C) (Oral)   Resp 16   Ht 5\' 3"  (1.6 m)   Wt 157 lb 2 oz (71.3 kg)   LMP 12/17/2018 (Exact Date)   SpO2 98%   BMI 27.83 kg/m  Gen: Afebrile. No acute distress. Nontoxic in presentation. Overweight.  HENT: AT. [place]. MMM.  Eyes:Pupils Equal Round Reactive to light, Extraocular movements intact,  Conjunctiva without redness, discharge or icterus. Neck/lymp/endocrine: Supple,no lymphadenopathy, no thyromegaly CV: RRR no murmur, no edema, +2/4 P posterior tibialis pulses Chest: CTAB, no wheeze or crackles Neuro:  Normal gait. PERLA. EOMi. Alert. Oriented. Psych: Normal affect, dress and demeanor. Normal speech. Normal thought content and judgment.     Results for orders placed or performed in visit on 01/17/19 (from the past 24 hour(s))  POCT glycosylated hemoglobin (Hb A1C)     Status: Abnormal   Collection Time: 01/17/19  3:43 PM  Result Value Ref Range   Hemoglobin A1C 8.0 (A) 4.0 - 5.6 %   HbA1c POC (<> result, manual entry) 8.0 4.0 - 5.6 %   HbA1c, POC  (prediabetic range) 8.0 (A) 5.7 - 6.4 %   HbA1c, POC (controlled diabetic range) 8.0 (A) 0.0 - 7.0 %    Assessment/plan: Brenda Rosales is a 32 y.o. female present for  Diabetes mellitus without complication (HCC)/overweight -  Metformin 1000 BID and added glipizide 5 mg QD - janumet was too expensive for her.  A1c: 6.0 --> 6.6--> 7.4 --> 6.7--> 7.3--> 8.0--> 8.0  today  Urine Microalbumin  w/creat. Ratio 05/12/2017: WNL--> collect.06/09/18 - diabetic diet and routine exercise encouraged.  - eye exam:  DR. Manuella Ghazi, 07/2018  - foot exam completed 06/09/18 - flu shot UTD 2019 - BMP: 05/12/2018 WNL - PPSV 23 completed 06/09/18, prevnar 10/2016  - Continue diet and exercise modifications.  - Follow-up every 3-4 months   Return in about 3 months (around 04/19/2019) for DM.   Electronically signed by: Howard Pouch, DO [place]

## 2019-01-17 NOTE — Patient Instructions (Signed)
Increase metformin to twice a day. Start glipizide once a day with your 1st meal.   Follow up 3 months.    Diabetes Mellitus and Exercise Exercising regularly is important for your overall health, especially when you have diabetes (diabetes mellitus). Exercising is not only about losing weight. It has many other health benefits, such as increasing muscle strength and bone density and reducing body fat and stress. This leads to improved fitness, flexibility, and endurance, all of which result in better overall health. Exercise has additional benefits for people with diabetes, including:  Reducing appetite.  Helping to lower and control blood glucose.  Lowering blood pressure.  Helping to control amounts of fatty substances (lipids) in the blood, such as cholesterol and triglycerides.  Helping the body to respond better to insulin (improving insulin sensitivity).  Reducing how much insulin the body needs.  Decreasing the risk for heart disease by: ? Lowering cholesterol and triglyceride levels. ? Increasing the levels of good cholesterol. ? Lowering blood glucose levels. What is my activity plan? Your health care provider or certified diabetes educator can help you make a plan for the type and frequency of exercise (activity plan) that works for you. Make sure that you:  Do at least 150 minutes of moderate-intensity or vigorous-intensity exercise each week. This could be brisk walking, biking, or water aerobics. ? Do stretching and strength exercises, such as yoga or weightlifting, at least 2 times a week. ? Spread out your activity over at least 3 days of the week.  Get some form of physical activity every day. ? Do not go more than 2 days in a row without some kind of physical activity. ? Avoid being inactive for more than 30 minutes at a time. Take frequent breaks to walk or stretch.  Choose a type of exercise or activity that you enjoy, and set realistic goals.  Start slowly,  and gradually increase the intensity of your exercise over time. What do I need to know about managing my diabetes?   Check your blood glucose before and after exercising. ? If your blood glucose is 240 mg/dL (13.3 mmol/L) or higher before you exercise, check your urine for ketones. If you have ketones in your urine, do not exercise until your blood glucose returns to normal. ? If your blood glucose is 100 mg/dL (5.6 mmol/L) or lower, eat a snack containing 15-20 grams of carbohydrate. Check your blood glucose 15 minutes after the snack to make sure that your level is above 100 mg/dL (5.6 mmol/L) before you start your exercise.  Know the symptoms of low blood glucose (hypoglycemia) and how to treat it. Your risk for hypoglycemia increases during and after exercise. Common symptoms of hypoglycemia can include: ? Hunger. ? Anxiety. ? Sweating and feeling clammy. ? Confusion. ? Dizziness or feeling light-headed. ? Increased heart rate or palpitations. ? Blurry vision. ? Tingling or numbness around the mouth, lips, or tongue. ? Tremors or shakes. ? Irritability.  Keep a rapid-acting carbohydrate snack available before, during, and after exercise to help prevent or treat hypoglycemia.  Avoid injecting insulin into areas of the body that are going to be exercised. For example, avoid injecting insulin into: ? The arms, when playing tennis. ? The legs, when jogging.  Keep records of your exercise habits. Doing this can help you and your health care provider adjust your diabetes management plan as needed. Write down: ? Food that you eat before and after you exercise. ? Blood glucose levels before and  after you exercise. ? The type and amount of exercise you have done. ? When your insulin is expected to peak, if you use insulin. Avoid exercising at times when your insulin is peaking.  When you start a new exercise or activity, work with your health care provider to make sure the activity is  safe for you, and to adjust your insulin, medicines, or food intake as needed.  Drink plenty of water while you exercise to prevent dehydration or heat stroke. Drink enough fluid to keep your urine clear or pale yellow. Summary  Exercising regularly is important for your overall health, especially when you have diabetes (diabetes mellitus).  Exercising has many health benefits, such as increasing muscle strength and bone density and reducing body fat and stress.  Your health care provider or certified diabetes educator can help you make a plan for the type and frequency of exercise (activity plan) that works for you.  When you start a new exercise or activity, work with your health care provider to make sure the activity is safe for you, and to adjust your insulin, medicines, or food intake as needed. This information is not intended to replace advice given to you by your health care provider. Make sure you discuss any questions you have with your health care provider. Document Released: 01/17/2004 Document Revised: 05/07/2017 Document Reviewed: 04/07/2016 Elsevier Interactive Patient Education  2019 [employer].

## 2019-02-14 ENCOUNTER — Other Ambulatory Visit: Payer: Self-pay

## 2019-02-14 MED ORDER — ETONOGESTREL-ETHINYL ESTRADIOL 0.12-0.015 MG/24HR VA RING: 1.0000 | VAGINAL_RING | VAGINAL | 0 refills | Status: DC

## 2019-02-14 NOTE — Telephone Encounter (Signed)
Medication refill request: NuvaRing Last AEX:  02/25/18 BS Next AEX: none scheduled  Last MMG (if hormonal medication request): n/a Refill authorized: Please advise; Order pended for #3 w/0 refills if authorized

## 2019-02-23 ENCOUNTER — Other Ambulatory Visit: Payer: Self-pay | Admitting: Family Medicine

## 2019-02-23 DIAGNOSIS — J301 Allergic rhinitis due to pollen: Secondary | ICD-10-CM

## 2019-04-20 ENCOUNTER — Ambulatory Visit: Payer: BC Managed Care – PPO | Admitting: Family Medicine

## 2019-04-27 ENCOUNTER — Telehealth: Payer: Self-pay | Admitting: Family Medicine

## 2019-04-27 ENCOUNTER — Encounter: Payer: Self-pay | Admitting: Family Medicine

## 2019-04-27 ENCOUNTER — Other Ambulatory Visit: Payer: Self-pay

## 2019-04-27 ENCOUNTER — Ambulatory Visit: Payer: BC Managed Care – PPO | Admitting: Family Medicine

## 2019-04-27 VITALS — BP 107/75 | HR 68 | Temp 97.9°F | Resp 18 | Ht 63.0 in | Wt 159.2 lb

## 2019-04-27 DIAGNOSIS — E119 Type 2 diabetes mellitus without complications: Secondary | ICD-10-CM | POA: Diagnosis not present

## 2019-04-27 DIAGNOSIS — E781 Pure hyperglyceridemia: Secondary | ICD-10-CM | POA: Diagnosis not present

## 2019-04-27 DIAGNOSIS — E663 Overweight: Secondary | ICD-10-CM | POA: Diagnosis not present

## 2019-04-27 LAB — CBC
HCT: 42.2 % (ref 36.0–46.0)
Hemoglobin: 14.6 g/dL (ref 12.0–15.0)
MCHC: 34.6 g/dL (ref 30.0–36.0)
MCV: 91.7 fl (ref 78.0–100.0)
Platelets: 263 10*3/uL (ref 150.0–400.0)
RBC: 4.6 Mil/uL (ref 3.87–5.11)
RDW: 12.4 % (ref 11.5–15.5)
WBC: 8.1 10*3/uL (ref 4.0–10.5)

## 2019-04-27 LAB — COMPREHENSIVE METABOLIC PANEL
ALT: 26 U/L (ref 0–35)
AST: 18 U/L (ref 0–37)
Albumin: 4 g/dL (ref 3.5–5.2)
Alkaline Phosphatase: 72 U/L (ref 39–117)
BUN: 12 mg/dL (ref 6–23)
CO2: 24 mEq/L (ref 19–32)
Calcium: 9 mg/dL (ref 8.4–10.5)
Chloride: 103 mEq/L (ref 96–112)
Creatinine, Ser: 0.6 mg/dL (ref 0.40–1.20)
GFR: 114.89 mL/min (ref >60.00)
Glucose, Bld: 269 mg/dL — ABNORMAL HIGH (ref 70–99)
Potassium: 4 mEq/L (ref 3.5–5.1)
Sodium: 135 mEq/L (ref 135–145)
Total Bilirubin: 0.4 mg/dL (ref 0.2–1.2)
Total Protein: 6.6 g/dL (ref 6.0–8.3)

## 2019-04-27 LAB — LDL CHOLESTEROL, DIRECT: Direct LDL: 142 mg/dL

## 2019-04-27 LAB — MICROALBUMIN / CREATININE URINE RATIO
Creatinine,U: 117.4 mg/dL
Microalb Creat Ratio: 0.6 mg/g (ref 0.0–30.0)
Microalb, Ur: <0.7 mg/dL (ref 0.0–1.9)

## 2019-04-27 LAB — LIPID PANEL
Cholesterol: 233 mg/dL — ABNORMAL HIGH (ref 0–200)
HDL: 33.4 mg/dL — ABNORMAL LOW (ref >39.00)
Total CHOL/HDL Ratio: 7
Triglycerides: 491 mg/dL — ABNORMAL HIGH (ref 0.0–149.0)

## 2019-04-27 LAB — TSH: TSH: 3.51 u[IU]/mL (ref 0.35–4.50)

## 2019-04-27 LAB — HEMOGLOBIN A1C: Hgb A1c MFr Bld: 8.9 % — ABNORMAL HIGH (ref 4.6–6.5)

## 2019-04-27 MED ORDER — FENOFIBRATE 145 MG PO TABS: 145.0000 mg | ORAL_TABLET | Freq: Every day | ORAL | 1 refills | Status: DC

## 2019-04-27 MED ORDER — GLIPIZIDE 10 MG PO TABS: ORAL_TABLET | ORAL | 1 refills | Status: DC

## 2019-04-27 MED ORDER — GLIPIZIDE 5 MG PO TABS: 5.0000 mg | ORAL_TABLET | Freq: Every day | ORAL | 1 refills | Status: DC

## 2019-04-27 MED ORDER — METFORMIN HCL 1000 MG PO TABS: 1000.0000 mg | ORAL_TABLET | Freq: Two times a day (BID) | ORAL | 1 refills | Status: DC

## 2019-04-27 NOTE — Patient Instructions (Signed)
Diabetes Mellitus and Exercise Exercising regularly is important for your overall health, especially when you have diabetes (diabetes mellitus). Exercising is not only about losing weight. It has many other health benefits, such as increasing muscle strength and bone density and reducing body fat and stress. This leads to improved fitness, flexibility, and endurance, all of which result in better overall health. Exercise has additional benefits for people with diabetes, including:  Reducing appetite.  Helping to lower and control blood glucose.  Lowering blood pressure.  Helping to control amounts of fatty substances (lipids) in the blood, such as cholesterol and triglycerides.  Helping the body to respond better to insulin (improving insulin sensitivity).  Reducing how much insulin the body needs.  Decreasing the risk for heart disease by: ? Lowering cholesterol and triglyceride levels. ? Increasing the levels of good cholesterol. ? Lowering blood glucose levels. What is my activity plan? Your health care provider or certified diabetes educator can help you make a plan for the type and frequency of exercise (activity plan) that works for you. Make sure that you:  Do at least 150 minutes of moderate-intensity or vigorous-intensity exercise each week. This could be brisk walking, biking, or water aerobics. ? Do stretching and strength exercises, such as yoga or weightlifting, at least 2 times a week. ? Spread out your activity over at least 3 days of the week.  Get some form of physical activity every day. ? Do not go more than 2 days in a row without some kind of physical activity. ? Avoid being inactive for more than 30 minutes at a time. Take frequent breaks to walk or stretch.  Choose a type of exercise or activity that you enjoy, and set realistic goals.  Start slowly, and gradually increase the intensity of your exercise over time. What do I need to know about managing my  diabetes?   Check your blood glucose before and after exercising. ? If your blood glucose is 240 mg/dL (13.3 mmol/L) or higher before you exercise, check your urine for ketones. If you have ketones in your urine, do not exercise until your blood glucose returns to normal. ? If your blood glucose is 100 mg/dL (5.6 mmol/L) or lower, eat a snack containing 15-20 grams of carbohydrate. Check your blood glucose 15 minutes after the snack to make sure that your level is above 100 mg/dL (5.6 mmol/L) before you start your exercise.  Know the symptoms of low blood glucose (hypoglycemia) and how to treat it. Your risk for hypoglycemia increases during and after exercise. Common symptoms of hypoglycemia can include: ? Hunger. ? Anxiety. ? Sweating and feeling clammy. ? Confusion. ? Dizziness or feeling light-headed. ? Increased heart rate or palpitations. ? Blurry vision. ? Tingling or numbness around the mouth, lips, or tongue. ? Tremors or shakes. ? Irritability.  Keep a rapid-acting carbohydrate snack available before, during, and after exercise to help prevent or treat hypoglycemia.  Avoid injecting insulin into areas of the body that are going to be exercised. For example, avoid injecting insulin into: ? The arms, when playing tennis. ? The legs, when jogging.  Keep records of your exercise habits. Doing this can help you and your health care provider adjust your diabetes management plan as needed. Write down: ? Food that you eat before and after you exercise. ? Blood glucose levels before and after you exercise. ? The type and amount of exercise you have done. ? When your insulin is expected to peak, if you use   insulin. Avoid exercising at times when your insulin is peaking.  When you start a new exercise or activity, work with your health care provider to make sure the activity is safe for you, and to adjust your insulin, medicines, or food intake as needed.  Drink plenty of water while  you exercise to prevent dehydration or heat stroke. Drink enough fluid to keep your urine clear or pale yellow. Summary  Exercising regularly is important for your overall health, especially when you have diabetes (diabetes mellitus).  Exercising has many health benefits, such as increasing muscle strength and bone density and reducing body fat and stress.  Your health care provider or certified diabetes educator can help you make a plan for the type and frequency of exercise (activity plan) that works for you.  When you start a new exercise or activity, work with your health care provider to make sure the activity is safe for you, and to adjust your insulin, medicines, or food intake as needed. This information is not intended to replace advice given to you by your health care provider. Make sure you discuss any questions you have with your health care provider. Document Released: 01/17/2004 Document Revised: 05/07/2017 Document Reviewed: 04/07/2016 Elsevier Interactive Patient Education  2019 [employer] Inc[employer]  

## 2019-04-27 NOTE — Progress Notes (Signed)
Patient ID: Brenda Rosales, female   DOB: March 07, 1986, 33 y.o.   MRN: [id number]       Patient ID: Brenda Rosales, female  DOB: 12-04-1985, 33 y.o.   MRN: [id number]  Subjective:  Brenda Rosales is a 33 y.o. female present for follow up on uncontrolled diabetes.  Patient Care Team    Relationship Specialty Notifications Start End  Ma Hillock, DO PCP - General Family Medicine  04/23/16    Comment: Patient request  Nunzio Cobbs, MD Consulting Physician Obstetrics and Gynecology  04/23/16   Deneise Lever, MD Consulting Physician Pulmonary Disease  04/23/16   Irene Shipper, MD Consulting Physician Gastroenterology  04/23/16   Danice Goltz, MD Consulting Physician Ophthalmology  02/10/17    Diabetes/obesity:  Pt reports she is compliant with  metformin 1000 mg at least QD- she frequently forgets second dose. Glipizide was added last visit and she is tolerating. Patient denies dizziness, hyperglycemic or hypoglycemic events. Patient denies numbness, tingling in the extremities or nonhealing wounds of feet.  She could not afford the janumet. She does not routinely check blood sugars at home.  A1c: 6.0 --> 6.6--> 7.4 --> 6.7--> 7.3--> 8.0--> 8.0 >> collected today  Urine Microalbumin w/creat.: 06/09/18 >> WNL>> collected today - diabetic diet and routine exercise encouraged.  - eye exam:  DR. Manuella Ghazi, 07/2018  - foot exam completed 04/27/2019 - flu shot UTD 2019 - BMP: 05/12/2018 WNL>>collected today - PPSV 23 completed 06/09/18, prevnar 10/2016   No results found for this or any previous visit (from the past 2160 hour(s)).  Immunization History  Administered Date(s) Administered  . Influenza Inj Mdck Quad With Preservative 08/19/2018  . Influenza Whole 08/19/2009  . Influenza,inj,Quad PF,6+ Mos 07/31/2016  . Influenza-Unspecified 08/11/2015, 07/12/2017  . Pneumococcal Conjugate-13 11/06/2016  . Pneumococcal Polysaccharide-23 07/23/2007, 06/09/2018  . Td  09/11/2004  . Tdap 07/04/2012    Past Medical History:  Diagnosis Date  . Allergy   . Anxiety   . ASTHMA 11/19/2007  . CHICKENPOX 11/19/2007  . Diabetes mellitus without complication (HCC)    diet controlled  . Duodenitis   . Dysmenorrhea 11/19/2007  . DYSPHAGIA 11/19/2010  . ECZEMA 11/19/2007  . Esophageal stricture   . Esophagitis   . FATTY LIVER DISEASE 04/17/2008  . GERD 11/20/2010  . Hyperlipidemia   . IBS (irritable bowel syndrome)   . Infection of eyelid 08/07/2011  . Low vitamin D level 2018  . Other and unspecified noninfectious gastroenteritis and colitis(558.9) 02/2014  . RHINOSINUSITIS, CHRONIC 12/09/2007  . TB SKIN TEST, POSITIVE 11/19/2007   Allergies  Allergen Reactions  . Vancomycin Other (See Comments)    redmans syndrome    Past Surgical History:  Procedure Laterality Date  . CT SINUS LTD W/O CM  09/23/07  . Mole excision     x's 4   Family History  Adopted: Yes   Social History   Socioeconomic History  . Marital status: Single    Spouse name: Not on file  . Number of children: 0  . Years of education: Not on file  . Highest education level: Not on file  Occupational History  . Occupation: Product manager: [employer]  Social Needs  . Financial resource strain: Not on file  . Food insecurity    Worry: Not on file    Inability: Not on file  . Transportation needs    Medical: Not on file    Non-medical: Not on  file  Tobacco Use  . Smoking status: Never Smoker  . Smokeless tobacco: Never Used  Substance and Sexual Activity  . Alcohol use: Yes    Alcohol/week: 1.0 standard drinks    Types: 1 Standard drinks or equivalent per week    Comment: SOCIALLY  . Drug use: No  . Sexual activity: Yes    Partners: Male    Birth control/protection: Pill    Comment: Balziva  Lifestyle  . Physical activity    Days per week: Not on file    Minutes per session: Not on file  . Stress: Not on file  Relationships  . Social Herbalist on  phone: Not on file    Gets together: Not on file    Attends religious service: Not on file    Active member of club or organization: Not on file    Attends meetings of clubs or organizations: Not on file    Relationship status: Not on file  . Intimate partner violence    Fear of current or ex partner: Not on file    Emotionally abused: Not on file    Physically abused: Not on file    Forced sexual activity: Not on file  Other Topics Concern  . Not on file  Social History Narrative   Adopted from [place].    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   Drinks caffeine, uses herbal remedies   Wears her seatbelt, wears bicycle helmet   Reports routine exercise    Smoke detector in the home.   Feels safe in relationships.       ROS: Negative, with the exception of above mentioned in HPI  Objective: BP 107/75 (BP Location: Right Arm, Patient Position: Sitting, Cuff Size: Normal)   Pulse 68   Temp 97.9 F (36.6 C) (Temporal)   Resp 18   Ht '5\' 3"'  (1.6 m)   Wt 159 lb 4 oz (72.2 kg)   LMP 04/17/2019 (Exact Date)   SpO2 95%   BMI 28.21 kg/m  Gen: Afebrile. No acute distress. Nontoxic, overweight HENT: AT. [place].  MMM. Eyes:Pupils Equal Round Reactive to light, Extraocular movements intact,  Conjunctiva without redness, discharge or icterus. Neck/lymp/endocrine: Supple,no lymphadenopathy, no thyromegaly CV: RRR no murmur, no edema, +2/4 P posterior tibialis pulses Chest: CTAB, no wheeze or crackles Skin: no rashes, purpura or petechiae.  Neuro:  Normal gait. PERLA. EOMi. Alert. Oriented x3 Psych: Normal affect, dress and demeanor. Normal speech. Normal thought content and judgment.  Diabetic Foot Exam - Simple   Simple Foot Form Diabetic Foot exam was performed with the following findings: Yes 04/27/2019  8:50 AM  Visual Inspection No deformities, no ulcerations, no other skin breakdown bilaterally: Yes Sensation Testing Intact to touch  and monofilament testing bilaterally: Yes Pulse Check Posterior Tibialis and Dorsalis pulse intact bilaterally: Yes Comments    No results found for this or any previous visit (from the past 24 hour(s)).  Assessment/plan: Brenda Rosales is a 33 y.o. female present for  Diabetes mellitus without complication (HCC)/overweight/HLD -  Continue Metformin 1000 BID and glipizide 5 mg QD. She frequently forgets the 2nd dose of metformin - janumet was too expensive for her.  A1c: 6.0 --> 6.6--> 7.4 --> 6.7--> 7.3--> 8.0--> 8.0 >> collected today  Urine Microalbumin w/creat.: 06/09/18 >> WNL>> collected today - diabetic diet and routine exercise encouraged.  - eye exam:  DR. Manuella Ghazi, 07/2018  - foot exam  completed 04/27/2019 - flu shot UTD 2019 - BMP: 05/12/2018 WNL>>collected today - CBC, CMP, TSH, lipid - PPSV 23 completed 06/09/18, prevnar 10/2016  - Continue diet and exercise modifications.  - Follow-up every 3-4 months   Return in about 4 months (around 08/27/2019) for [place] (15 min).  > 25 minutes spent with patient, >50% of time spent face to face  Orders Placed This Encounter  Procedures  . CBC  . Comp Met (CMET)  . TSH  . Lipid panel  . Hemoglobin A1c  . Urine Microalbumin w/creat. ratio     Electronically signed by: Howard Pouch, DO [place]

## 2019-04-27 NOTE — Telephone Encounter (Signed)
Please inform patient the following information: Her a1c went up to 8.9. I have changed her glipizide pill to 10 mg tab. Please make sure pharmacy fills correct dose.  She will take 10 mg (30 min prior to first meal) for 7 days, then increase to 1.5 tabs (15 mg total) until we see each other in 3 months.  Continue the metformin.  Her cholesterol panel was high.  Total cholesterol 233 (goal less than 200), good cholesterol 33 (goal 40), bad cholesterol 142 (goal <100), triglycerides 491(goal <150).  I have called in a medicine called fenofibrate for her to start this is a fiber-based medicine that will help lower her triglycerides.  Triglycerides of this level are concerning for cardiovascular health.  We will recheck her cholesterol at her follow-up in 3 months.

## 2019-04-28 NOTE — Telephone Encounter (Signed)
Pt was called and VM was left to return call  °

## 2019-04-29 NOTE — Telephone Encounter (Signed)
Pt was called and message was left to return call  

## 2019-04-29 NOTE — Telephone Encounter (Signed)
Patient LM w/ [facility] requesting return call.

## 2019-05-02 NOTE — Telephone Encounter (Signed)
Pt was called and given lab results with instructions. Pt verbalized understanding. [facility] appt scheduled

## 2019-08-02 ENCOUNTER — Encounter: Payer: Self-pay | Admitting: Family Medicine

## 2019-08-02 ENCOUNTER — Other Ambulatory Visit: Payer: Self-pay

## 2019-08-02 ENCOUNTER — Ambulatory Visit: Payer: BC Managed Care – PPO | Admitting: Family Medicine

## 2019-08-02 VITALS — BP 113/78 | HR 75 | Temp 97.7°F | Resp 16 | Ht 63.0 in | Wt 152.5 lb

## 2019-08-02 DIAGNOSIS — E119 Type 2 diabetes mellitus without complications: Secondary | ICD-10-CM | POA: Diagnosis not present

## 2019-08-02 DIAGNOSIS — Z23 Encounter for immunization: Secondary | ICD-10-CM

## 2019-08-02 DIAGNOSIS — E781 Pure hyperglyceridemia: Secondary | ICD-10-CM | POA: Diagnosis not present

## 2019-08-02 DIAGNOSIS — E663 Overweight: Secondary | ICD-10-CM | POA: Diagnosis not present

## 2019-08-02 LAB — POCT GLYCOSYLATED HEMOGLOBIN (HGB A1C)
HbA1c POC (<> result, manual entry): 9.1 % (ref 4.0–5.6)
HbA1c, POC (controlled diabetic range): 9.1 % — AB (ref 0.0–7.0)
HbA1c, POC (prediabetic range): 9.1 % — AB (ref 5.7–6.4)
Hemoglobin A1C: 9.1 % — AB (ref 4.0–5.6)

## 2019-08-02 MED ORDER — BLOOD GLUCOSE METER KIT: PACK | 11 refills | Status: DC

## 2019-08-02 MED ORDER — METFORMIN HCL 1000 MG PO TABS: 1000.0000 mg | ORAL_TABLET | Freq: Two times a day (BID) | ORAL | 1 refills | Status: DC

## 2019-08-02 MED ORDER — GLIPIZIDE 10 MG PO TABS: 10.0000 mg | ORAL_TABLET | Freq: Every day | ORAL | 1 refills | Status: DC

## 2019-08-02 MED ORDER — SITAGLIPTIN PHOSPHATE 100 MG PO TABS: 100.0000 mg | ORAL_TABLET | Freq: Every day | ORAL | 1 refills | Status: DC

## 2019-08-02 NOTE — Patient Instructions (Signed)
Metformin twice a day.  Glipizide 10 mg a day.  Added januvia 100 mg a day.  Try to get back to exercising.    Diabetes Mellitus and Exercise Exercising regularly is important for your overall health, especially when you have diabetes (diabetes mellitus). Exercising is not only about losing weight. It has many other health benefits, such as increasing muscle strength and bone density and reducing body fat and stress. This leads to improved fitness, flexibility, and endurance, all of which result in better overall health. Exercise has additional benefits for people with diabetes, including:  Reducing appetite.  Helping to lower and control blood glucose.  Lowering blood pressure.  Helping to control amounts of fatty substances (lipids) in the blood, such as cholesterol and triglycerides.  Helping the body to respond better to insulin (improving insulin sensitivity).  Reducing how much insulin the body needs.  Decreasing the risk for heart disease by: ? Lowering cholesterol and triglyceride levels. ? Increasing the levels of good cholesterol. ? Lowering blood glucose levels. What is my activity plan? Your health care provider or certified diabetes educator can help you make a plan for the type and frequency of exercise (activity plan) that works for you. Make sure that you:  Do at least 150 minutes of moderate-intensity or vigorous-intensity exercise each week. This could be brisk walking, biking, or water aerobics. ? Do stretching and strength exercises, such as yoga or weightlifting, at least 2 times a week. ? Spread out your activity over at least 3 days of the week.  Get some form of physical activity every day. ? Do not go more than 2 days in a row without some kind of physical activity. ? Avoid being inactive for more than 30 minutes at a time. Take frequent breaks to walk or stretch.  Choose a type of exercise or activity that you enjoy, and set realistic goals.  Start  slowly, and gradually increase the intensity of your exercise over time. What do I need to know about managing my diabetes?   Check your blood glucose before and after exercising. ? If your blood glucose is 240 mg/dL (13.3 mmol/L) or higher before you exercise, check your urine for ketones. If you have ketones in your urine, do not exercise until your blood glucose returns to normal. ? If your blood glucose is 100 mg/dL (5.6 mmol/L) or lower, eat a snack containing 15-20 grams of carbohydrate. Check your blood glucose 15 minutes after the snack to make sure that your level is above 100 mg/dL (5.6 mmol/L) before you start your exercise.  Know the symptoms of low blood glucose (hypoglycemia) and how to treat it. Your risk for hypoglycemia increases during and after exercise. Common symptoms of hypoglycemia can include: ? Hunger. ? Anxiety. ? Sweating and feeling clammy. ? Confusion. ? Dizziness or feeling light-headed. ? Increased heart rate or palpitations. ? Blurry vision. ? Tingling or numbness around the mouth, lips, or tongue. ? Tremors or shakes. ? Irritability.  Keep a rapid-acting carbohydrate snack available before, during, and after exercise to help prevent or treat hypoglycemia.  Avoid injecting insulin into areas of the body that are going to be exercised. For example, avoid injecting insulin into: ? The arms, when playing tennis. ? The legs, when jogging.  Keep records of your exercise habits. Doing this can help you and your health care provider adjust your diabetes management plan as needed. Write down: ? Food that you eat before and after you exercise. ? Blood glucose  levels before and after you exercise. ? The type and amount of exercise you have done. ? When your insulin is expected to peak, if you use insulin. Avoid exercising at times when your insulin is peaking.  When you start a new exercise or activity, work with your health care provider to make sure the  activity is safe for you, and to adjust your insulin, medicines, or food intake as needed.  Drink plenty of water while you exercise to prevent dehydration or heat stroke. Drink enough fluid to keep your urine clear or pale yellow. Summary  Exercising regularly is important for your overall health, especially when you have diabetes (diabetes mellitus).  Exercising has many health benefits, such as increasing muscle strength and bone density and reducing body fat and stress.  Your health care provider or certified diabetes educator can help you make a plan for the type and frequency of exercise (activity plan) that works for you.  When you start a new exercise or activity, work with your health care provider to make sure the activity is safe for you, and to adjust your insulin, medicines, or food intake as needed. This information is not intended to replace advice given to you by your health care provider. Make sure you discuss any questions you have with your health care provider. Document Released: 01/17/2004 Document Revised: 05/21/2017 Document Reviewed: 04/07/2016 Elsevier Patient Education  2020 [employer].

## 2019-08-02 NOTE — Progress Notes (Signed)
Patient ID: Brenda Rosales, female   DOB: [birth year], 33 y.o.   MRN: [id number]       Patient ID: Brenda Rosales, female  DOB: October 25, 1986, 33 y.o.   MRN: [id number]  Subjective:  Brenda Rosales is a 33 y.o. female present for follow up on uncontrolled diabetes.  Patient Care Team    Relationship Specialty Notifications Start End  Ma Hillock, DO PCP - General Family Medicine  04/23/16    Comment: Patient request  Nunzio Cobbs, MD Consulting Physician Obstetrics and Gynecology  04/23/16   Deneise Lever, MD Consulting Physician Pulmonary Disease  04/23/16   Irene Shipper, MD Consulting Physician Gastroenterology  04/23/16   Danice Goltz, MD Consulting Physician Ophthalmology  02/10/17    Diabetes/obesity:  Pt reports she is compliant  with  metformin 1000 mg BID and  Glipizide 15 mg QD. Patient denies dizziness, hyperglycemic or hypoglycemic events. Patient denies dizziness, hyperglycemic or hypoglycemic events. Patient denies numbness, tingling in the extremities or nonhealing wounds of feet.  She could not afford the janumet. She does not routinely check blood sugars at home and has declined monitoring. She is agreeable to start now.  A1c: 6.0 --> 6.6--> 7.4 --> 6.7--> 7.3--> 8.0--> 8.0 >> 8.9 >>> 9.1 collected today  Urine Microalbumin w/creat.: 04/2019 >> WNL - diabetic diet and routine exercise encouraged.  - eye exam:  DR. Manuella Ghazi, 07/2018 >> encouraged her to schedule.  - foot exam completed 04/27/2019 - flu shot UTD 2020>> provided today - BMP: 04/2019 - CBC, CMP, TSH, lipid collected 04/2019 - PPSV 23 completed 06/09/18, prevnar 10/2016   Recent Results (from the past 2160 hour(s))  POCT glycosylated hemoglobin (Hb A1C)     Status: Abnormal   Collection Time: 08/02/19  1:48 PM  Result Value Ref Range   Hemoglobin A1C 9.1 (A) 4.0 - 5.6 %   HbA1c POC (<> result, manual entry) 9.1 4.0 - 5.6 %   HbA1c, POC (prediabetic range) 9.1 (A) 5.7 - 6.4 %   HbA1c,  POC (controlled diabetic range) 9.1 (A) 0.0 - 7.0 %    Immunization History  Administered Date(s) Administered  . Influenza Inj Mdck Quad With Preservative 08/19/2018  . Influenza Whole 08/19/2009  . Influenza,inj,Quad PF,6+ Mos 07/31/2016, 08/02/2019  . Influenza-Unspecified 08/11/2015, 07/12/2017  . Pneumococcal Conjugate-13 11/06/2016  . Pneumococcal Polysaccharide-23 07/23/2007, 06/09/2018  . Td 09/11/2004  . Tdap 07/04/2012    Past Medical History:  Diagnosis Date  . Allergy   . Anxiety   . ASTHMA 11/19/2007  . CHICKENPOX 11/19/2007  . Diabetes mellitus without complication (HCC)    diet controlled  . Duodenitis   . Dysmenorrhea 11/19/2007  . DYSPHAGIA 11/19/2010  . ECZEMA 11/19/2007  . Esophageal stricture   . Esophagitis   . FATTY LIVER DISEASE 04/17/2008  . GERD 11/20/2010  . Hyperlipidemia   . IBS (irritable bowel syndrome)   . Infection of eyelid 08/07/2011  . Low vitamin D level 2018  . Other and unspecified noninfectious gastroenteritis and colitis(558.9) 02/2014  . RHINOSINUSITIS, CHRONIC 12/09/2007  . TB SKIN TEST, POSITIVE 11/19/2007   Allergies  Allergen Reactions  . Vancomycin Other (See Comments)    redmans syndrome    Past Surgical History:  Procedure Laterality Date  . CT SINUS LTD W/O CM  09/23/07  . Mole excision     x's 4   Family History  Adopted: Yes   Social History   Socioeconomic History  . Marital status: Single  Spouse name: Not on file  . Number of children: 0  . Years of education: Not on file  . Highest education level: Not on file  Occupational History  . Occupation: Product manager: [employer]  Social Needs  . Financial resource strain: Not on file  . Food insecurity    Worry: Not on file    Inability: Not on file  . Transportation needs    Medical: Not on file    Non-medical: Not on file  Tobacco Use  . Smoking status: Never Smoker  . Smokeless tobacco: Never Used  Substance and Sexual Activity  . Alcohol  use: Yes    Alcohol/week: 1.0 standard drinks    Types: 1 Standard drinks or equivalent per week    Comment: SOCIALLY  . Drug use: No  . Sexual activity: Yes    Partners: Male    Birth control/protection: Pill    Comment: Balziva  Lifestyle  . Physical activity    Days per week: Not on file    Minutes per session: Not on file  . Stress: Not on file  Relationships  . Social Herbalist on phone: Not on file    Gets together: Not on file    Attends religious service: Not on file    Active member of club or organization: Not on file    Attends meetings of clubs or organizations: Not on file    Relationship status: Not on file  . Intimate partner violence    Fear of current or ex partner: Not on file    Emotionally abused: Not on file    Physically abused: Not on file    Forced sexual activity: Not on file  Other Topics Concern  . Not on file  Social History Narrative   Adopted from [place].    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   Drinks caffeine, uses herbal remedies   Wears her seatbelt, wears bicycle helmet   Reports routine exercise    Smoke detector in the home.   Feels safe in relationships.       ROS: Negative, with the exception of above mentioned in HPI  Objective: BP 113/78 (BP Location: Right Arm, Patient Position: Sitting, Cuff Size: Normal)   Pulse 75   Temp 97.7 F (36.5 C) (Temporal)   Resp 16   Ht 5\' 3"  (1.6 m)   Wt 152 lb 8 oz (69.2 kg)   LMP 07/14/2019 (Exact Date)   SpO2 98%   BMI 27.01 kg/m  Gen: Afebrile. No acute distress.  HENT: AT. [place].  Eyes:Pupils Equal Round Reactive to light, Extraocular movements intact,  Conjunctiva without redness, discharge or icterus. Neck/lymp/endocrine: Supple,no lymphadenopathy, no thyromegaly CV: RRR no murmur, no edema, +2/4 P posterior tibialis pulses Chest: CTAB, no wheeze or crackles Abd: Soft. NTND. BS present.  Skin: no rashes, purpura or  petechiae.  Neuro:  Normal gait. PERLA. EOMi. Alert. Oriented x3  Psych: Normal affect, dress and demeanor. Normal speech. Normal thought content and judgment.  No results found for this or any previous visit (from the past 24 hour(s)).  Assessment/plan: Brenda Rosales is a 33 y.o. female present for  Diabetes mellitus without complication (HCC)/overweight - Pt is frustrated. She has been taking medicine as prescribed and had started back exercising. Her weight went down by 7 lbs. However her a1c climbed to 9.1. Tried to encourage her to keep at the  exercise and diet. We will add Januvia to her regimen today and if she does not see lower a1c next time then we may need to start insulin.  - Continue Metformin 1000 BID. - decrease  glipizide to 10 mg QD since adding Januvia.  - Januvia 100 mg QD prescribed.  - janumet was too expensive for her.  - she is agreeable to start monitoring her BG. She was instructed to monitor fasting BG only for now and record. Educated on normal values- desire at least 100-120 FBG A1c: 6.0 --> 6.6--> 7.4 --> 6.7--> 7.3--> 8.0--> 8.0 >> 8.9 >>> 9.1 collected today  Urine Microalbumin w/creat.: 04/2019 >> WNL - diabetic diet and routine exercise encouraged.  - eye exam:  DR. Manuella Ghazi, 07/2018 >> encouraged her to schedule.  - foot exam completed 04/27/2019 - flu shot UTD 2020>> provided today - BMP: 04/2019 - CBC, CMP, TSH, lipid collected 04/2019 - PPSV 23 completed 06/09/18, prevnar 10/2016  - offered her an endocrine referral if she desired. Declined. - Follow-up every 3-4 months  Hyperlipidemia:  - prescribed tricor> not taking. tg 491 04/2019- she forgot about script. She will pick up and start  Influenza vaccine provided today.  Return in about 3 months (around 11/01/2019).  > 25 minutes spent with patient, >50% of time spent face to face   Orders Placed This Encounter  Procedures  . Flu Vaccine QUAD 36+ mos IM  . POCT glycosylated hemoglobin (Hb A1C)      Electronically signed by: Howard Pouch, DO [place]

## 2019-08-31 ENCOUNTER — Ambulatory Visit: Payer: BC Managed Care – PPO | Admitting: Family Medicine

## 2019-11-09 ENCOUNTER — Ambulatory Visit: Payer: BC Managed Care – PPO | Admitting: Family Medicine

## 2019-11-09 ENCOUNTER — Other Ambulatory Visit: Payer: Self-pay

## 2019-11-09 ENCOUNTER — Encounter: Payer: Self-pay | Admitting: Family Medicine

## 2019-11-09 VITALS — BP 112/77 | HR 86 | Temp 98.0°F | Resp 16 | Ht 63.0 in | Wt 152.1 lb

## 2019-11-09 DIAGNOSIS — E781 Pure hyperglyceridemia: Secondary | ICD-10-CM | POA: Diagnosis not present

## 2019-11-09 DIAGNOSIS — E663 Overweight: Secondary | ICD-10-CM

## 2019-11-09 DIAGNOSIS — E119 Type 2 diabetes mellitus without complications: Secondary | ICD-10-CM | POA: Diagnosis not present

## 2019-11-09 LAB — POCT GLYCOSYLATED HEMOGLOBIN (HGB A1C)
HbA1c POC (<> result, manual entry): 6.5 % (ref 4.0–5.6)
HbA1c, POC (controlled diabetic range): 6.5 % (ref 0.0–7.0)
HbA1c, POC (prediabetic range): 6.5 % — AB (ref 5.7–6.4)
Hemoglobin A1C: 6.5 % — AB (ref 4.0–5.6)

## 2019-11-09 MED ORDER — FENOFIBRATE 145 MG PO TABS: 145.0000 mg | ORAL_TABLET | Freq: Every day | ORAL | 1 refills | Status: DC

## 2019-11-09 MED ORDER — METFORMIN HCL 1000 MG PO TABS: 1000.0000 mg | ORAL_TABLET | Freq: Two times a day (BID) | ORAL | 1 refills | Status: DC

## 2019-11-09 MED ORDER — GLIPIZIDE 10 MG PO TABS: 10.0000 mg | ORAL_TABLET | Freq: Every day | ORAL | 1 refills | Status: DC

## 2019-11-09 NOTE — Progress Notes (Signed)
Patient ID: Jssica Melesio, female   DOB: 17-Sep-1986, 33 y.o.   MRN: [id number]    Patient Care Team    Relationship Specialty Notifications Start End  Ma Hillock, DO PCP - General Family Medicine  04/23/16    Comment: Patient request  Nunzio Cobbs, MD Consulting Physician Obstetrics and Gynecology  04/23/16   Deneise Lever, MD Consulting Physician Pulmonary Disease  04/23/16   Irene Shipper, MD Consulting Physician Gastroenterology  04/23/16   Danice Goltz, MD Consulting Physician Ophthalmology  02/10/17     Chief Complaint  Patient presents with  . Diabetes    Pt is doing well. Januvia was giving patients upset stomach and dizziness so she stopped it. Only taking Metformin.     Subjective: Shartavia Caines is a 33 y.o. female present for follow up on diabetes.  Diabetes/obesity:  Pt reports she has been compliant with with  metformin 1000 mg BID and  Glipizide 10 mg QD. Patient denies dizziness, hyperglycemic or hypoglycemic events. Patient denies numbness, tingling in the extremities or nonhealing wounds of feet.  After last appointment her A1c was 9.1 and Januvia was prescribed.  However patient cannot tolerate Januvia secondary to dizziness.  She could not afford the janumet.  Patient reports she changed her diet since the last visit and she has stopped drinking all alcohol and eating fast food.  She is also trying to decrease her bread consumption. A1c: 6.0 --> 6.6--> 7.4 --> 6.7--> 7.3--> 8.0--> 8.0 >> 8.9 >>> 9.1>> 6.5 today.  Urine Microalbumin w/creat.: 04/2019 >> WNL - diabetic diet and routine exercise encouraged.  - eye exam:  DR. Manuella Ghazi, 07/2018 >> encouraged her to schedule.  - foot exam completed 04/27/2019 - flu shot UTD 2020 - BMP: 04/2019 - CBC, CMP, TSH, lipid collected 04/2019 - PPSV 23 completed 06/09/18, prevnar 10/2016   Recent Results (from the past 2160 hour(s))  POCT HgB A1C     Status: Abnormal   Collection Time: 11/09/19  3:48 PM   Result Value Ref Range   Hemoglobin A1C 6.5 (A) 4.0 - 5.6 %   HbA1c POC (<> result, manual entry) 6.5 4.0 - 5.6 %   HbA1c, POC (prediabetic range) 6.5 (A) 5.7 - 6.4 %   HbA1c, POC (controlled diabetic range) 6.5 0.0 - 7.0 %    Immunization History  Administered Date(s) Administered  . Influenza Inj Mdck Quad With Preservative 08/19/2018  . Influenza Whole 08/19/2009  . Influenza,inj,Quad PF,6+ Mos 07/31/2016, 08/02/2019  . Influenza-Unspecified 08/11/2015, 07/12/2017  . Pneumococcal Conjugate-13 11/06/2016  . Pneumococcal Polysaccharide-23 07/23/2007, 06/09/2018  . Td 09/11/2004  . Tdap 07/04/2012    Past Medical History:  Diagnosis Date  . Allergy   . Anxiety   . ASTHMA 11/19/2007  . CHICKENPOX 11/19/2007  . Diabetes mellitus without complication (HCC)    diet controlled  . Duodenitis   . Dysmenorrhea 11/19/2007  . DYSPHAGIA 11/19/2010  . ECZEMA 11/19/2007  . Esophageal stricture   . Esophagitis   . FATTY LIVER DISEASE 04/17/2008  . GERD 11/20/2010  . Hyperlipidemia   . IBS (irritable bowel syndrome)   . Infection of eyelid 08/07/2011  . Low vitamin D level 2018  . Other and unspecified noninfectious gastroenteritis and colitis(558.9) 02/2014  . RHINOSINUSITIS, CHRONIC 12/09/2007  . TB SKIN TEST, POSITIVE 11/19/2007   Allergies  Allergen Reactions  . Vancomycin Other (See Comments)    redmans syndrome   . Januvia [Sitagliptin] Other (See Comments)  Dizziness   Past Surgical History:  Procedure Laterality Date  . CT SINUS LTD W/O CM  09/23/07  . Mole excision     x's 4   Family History  Adopted: Yes   Social History   Socioeconomic History  . Marital status: Single    Spouse name: Not on file  . Number of children: 0  . Years of education: Not on file  . Highest education level: Not on file  Occupational History  . 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 Smoking status: Never Smoker  . Smokeless tobacco: Never Used  Substance  and Sexual Activity  . Alcohol use: Yes    Alcohol/week: 1.0 standard drinks    Types: 1 Standard drinks or equivalent per week    Comment: SOCIALLY  . Drug use: No  . Sexual activity: Yes    Partners: Male    Birth control/protection: Pill    Comment: Hulda Humphrey  Other Topics Concern  . Not on file  Social History Narrative   Adopted from [place].    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   Drinks caffeine, uses herbal remedies   Wears her seatbelt, wears bicycle helmet   Reports routine exercise    Smoke detector in the home.   Feels safe in relationships.     Social Determinants of Health   Financial Resource Strain:   . Difficulty of Paying Living Expenses: Not on file  Food Insecurity:   . Worried About Charity fundraiser in the Last Year: Not on file  . Ran Out of Food in the Last Year: Not on file  Transportation Needs:   . Lack of Transportation (Medical): Not on file  . Lack of Transportation (Non-Medical): Not on file  Physical Activity:   . Days of Exercise per Week: Not on file  . Minutes of Exercise per Session: Not on file  Stress:   . Feeling of Stress : Not on file  Social Connections:   . Frequency of Communication with Friends and Family: Not on file  . Frequency of Social Gatherings with Friends and Family: Not on file  . Attends Religious Services: Not on file  . Active Member of Clubs or Organizations: Not on file  . Attends Archivist Meetings: Not on file  . Marital Status: Not on file  Intimate Partner Violence:   . Fear of Current or Ex-Partner: Not on file  . Emotionally Abused: Not on file  . Physically Abused: Not on file  . Sexually Abused: Not on file     ROS: Negative, with the exception of above mentioned in HPI  Objective: BP 112/77 (BP Location: Right Arm, Patient Position: Sitting, Cuff Size: Normal)   Pulse 86   Temp 98 F (36.7 C) (Temporal)   Resp 16   Ht 5\' 3"  (1.6 m)    Wt 152 lb 2 oz (69 kg)   LMP 10/30/2019 (Exact Date)   SpO2 97%   BMI 26.95 kg/m  Gen: Afebrile. No acute distress.  HENT: AT. [place].  Eyes:Pupils Equal Round Reactive to light, Extraocular movements intact,  Conjunctiva without redness, discharge or icterus. CV: RRR no murmur, no edema, +2/4 P posterior tibialis pulses Chest: CTAB, no wheeze or crackles Abd: Soft. NTND. BS present.  Neuro:  Normal gait. PERLA. EOMi. Alert. Oriented x3  Psych: Normal affect, dress and demeanor. Normal speech. Normal thought content and  judgment.    Results for orders placed or performed in visit on 11/09/19 (from the past 24 hour(s))  POCT HgB A1C     Status: Abnormal   Collection Time: 11/09/19  3:48 PM  Result Value Ref Range   Hemoglobin A1C 6.5 (A) 4.0 - 5.6 %   HbA1c POC (<> result, manual entry) 6.5 4.0 - 5.6 %   HbA1c, POC (prediabetic range) 6.5 (A) 5.7 - 6.4 %   HbA1c, POC (controlled diabetic range) 6.5 0.0 - 7.0 %    Assessment/plan: Arianah Poorman is a 33 y.o. female present for  Diabetes mellitus without complication (HCC)/overweight -Congratulated her.  She did really great the last few months. - Continue Metformin 1000 BID.  Medications refilled today. - Continue   glipizide to 10 mg QD.  Medications refilled today. -Tried medications: Januvia 100 mg QD prescribed>> upset her stomach and has not used.janumet was too expensive for her.  - she is agreeable to start monitoring her BG. She was instructed to monitor fasting BG only for now and record. Educated on normal values- desire at least 100-120 FBG A1c: 6.0 --> 6.6--> 7.4 --> 6.7--> 7.3--> 8.0--> 8.0 >> 8.9 >>> 9.1>>>6.5 today   Urine Microalbumin w/creat.: 04/2019 >> WNL - diabetic diet and routine exercise encouraged.  - eye exam: [facility] on [address]. > 07/2018 patient encouraged to schedule her yearly eye exam. - foot exam completed 04/27/2019 - flu shot UTD 2020 - BMP: 04/2019 - CBC, CMP, TSH, lipid collected 04/2019 - PPSV  23 completed 06/09/18, prevnar 10/2016  - Follow-up every 3-4 months  Hyperlipidemia:  - prescribed tricor>  tg 491 04/2019 -If fasting next appointment consider lipid recheck after starting TriCor if she is taking.   Return in about 15 weeks (around 02/22/2020) for [facility] (30 min).  > 25 minutes spent with patient, >50% of time spent face to face   Orders Placed This Encounter  Procedures  . POCT HgB A1C     Electronically signed by: Howard Pouch, [place]

## 2019-11-09 NOTE — Patient Instructions (Addendum)
You did great!!! A1c 6.5 today.   Keep up the good work  Follow up 4 months.

## 2020-01-31 NOTE — Progress Notes (Signed)
34 y.o. G0P0000 Single Hispanic female here for annual exam.    She wants to return to using the NuvaRing.  She wants to continue.   Last A1C 6.2. She will check her vit D with her next blood work in April with her PCP.  Declines STD testing.   Getting her second Covid vaccine this weekend.   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   PCP: Howard Pouch, DO                                    Patient's last menstrual period was 01/18/2020 (exact date).           Sexually active: No.  The current method of family planning is NuvaRing vaginal inserts.    Exercising: Yes.    Just starting back with weights and cardio Smoker:  no  Health Maintenance: Pap: 02-21-16 Neg:Neg HR HPV; 02-08-15 Neg History of abnormal Pap:  Yes, abnormal pap 2011 with colposcopy but no treatment to cervix.  MMG: n/a Colonoscopy:  04/2008 for abdominal pain with Dr. Perry:normal BMD:   n/a  Result  n/a TDaP:  2013 Gardasil:   Yes, completed HIV: 02-20-17 NR Hep C: 02-20-17 Neg Screening Labs:  PCP.    reports that she has never smoked. She has never used smokeless tobacco. She reports current alcohol use of about 1.0 standard drinks of alcohol per week. She reports that she does not use drugs.  Past Medical History:  Diagnosis Date  . Allergy   . Anxiety   . ASTHMA 11/19/2007  . CHICKENPOX 11/19/2007  . Diabetes mellitus without complication (HCC)    diet controlled  . Duodenitis   . Dysmenorrhea 11/19/2007  . DYSPHAGIA 11/19/2010  . ECZEMA 11/19/2007  . Esophageal stricture   . Esophagitis   . FATTY LIVER DISEASE 04/17/2008  . GERD 11/20/2010  . Hyperlipidemia   . IBS (irritable bowel syndrome)   . Infection of eyelid 08/07/2011  . Low vitamin D level 2018  . Other and unspecified noninfectious gastroenteritis and colitis(558.9) 02/2014  . RHINOSINUSITIS, CHRONIC 12/09/2007  . TB SKIN TEST, POSITIVE 11/19/2007    Past Surgical History:  Procedure Laterality Date  . CT SINUS LTD W/O CM  09/23/07  . Mole  excision     x's 4    Current Outpatient Medications  Medication Sig Dispense Refill  . blood glucose meter kit and supplies Dispense based on patient and insurance preference. Use up to four times daily as directed. DX E11.9 1 each 11  . EPINEPHrine 0.3 mg/0.3 mL IJ SOAJ injection Inject 0.3 mg into the muscle once.    . etonogestrel-ethinyl estradiol (NUVARING) 0.12-0.015 MG/24HR vaginal ring Place 1 each vaginally every 28 (twenty-eight) days. Insert vaginally and leave in place for 3 consecutive weeks, then remove for 1 week. 3 each 0  . fenofibrate (TRICOR) 145 MG tablet Take 1 tablet (145 mg total) by mouth daily. 90 tablet 1  . glipiZIDE (GLUCOTROL) 10 MG tablet Take 1 tablet (10 mg total) by mouth daily. 90 tablet 1  . levocetirizine (XYZAL) 5 MG tablet TAKE 1 TABLET BY MOUTH EVERY DAY IN THE EVENING 90 tablet 0  . metFORMIN (GLUCOPHAGE) 1000 MG tablet Take 1 tablet (1,000 mg total) by mouth 2 (two) times daily with a meal. 180 tablet 1  . montelukast (SINGULAIR) 10 MG tablet every evening.    . SYMBICORT 160-4.5 MCG/ACT inhaler  Inhale 2 puffs into the lungs 2 (two) times daily.  3  . tacrolimus (PROTOPIC) 0.1 % ointment Apply topically 2 (two) times daily. (Patient taking differently: Apply 1 application topically 2 (two) times daily as needed (for rash, skin issues). ) 100 g 1   No current facility-administered medications for this visit.    Family History  Adopted: Yes    Review of Systems  All other systems reviewed and are negative.   Exam:   BP 104/60   Pulse 88   Temp 98.1 F (36.7 C) (Temporal)   Resp 16   Ht 5' 3.25" (1.607 m)   Wt 160 lb (72.6 kg)   LMP 01/18/2020 (Exact Date)   BMI 28.12 kg/m     General appearance: alert, cooperative and appears stated age Head: normocephalic, without obvious abnormality, atraumatic Neck: no adenopathy, supple, symmetrical, trachea midline and thyroid normal to inspection and palpation Lungs: clear to auscultation  bilaterally Breasts: no masses or tenderness, no nipple retraction or dimpling, bilateral cracked nipples without bleeding, No axillary adenopathy Heart: regular rate and rhythm Abdomen: soft, non-tender; no masses, no organomegaly Extremities: extremities normal, atraumatic, no cyanosis or edema Skin: skin color, texture, turgor normal. No rashes or lesions Lymph nodes: cervical, supraclavicular, and axillary nodes normal. Neurologic: grossly normal  Pelvic: External genitalia:  no lesions              No abnormal inguinal nodes palpated.              Urethra:  normal appearing urethra with no masses, tenderness or lesions              Bartholins and Skenes: normal                 Vagina: normal appearing vagina with normal color and discharge, no lesions              Cervix: no lesions              Pap taken: Yes.   Bimanual Exam:  Uterus:  normal size, contour, position, consistency, mobility, non-tender              Adnexa: no mass, fullness, tenderness              Chaperone was present for exam.  Assessment:   Well woman visit with normal exam. Dysmenorrhea. Hx LGSIL.  Cracked nipples.  Eczema.  DM.  Plan: Mammogram screening discussed. Self breast awareness reviewed. Pap and HR HPV as above. Guidelines for Calcium, Vitamin D, regular exercise program including cardiovascular and weight bearing exercise. Continue NuvaRing.  Rx for one year. Rx for all purpose nipple cream with ibuprofen.    Routine labs with PCP.  Fu in 2.5 weeks. Follow up annually and prn.   After visit summary provided.

## 2020-02-01 ENCOUNTER — Other Ambulatory Visit (HOSPITAL_COMMUNITY)
Admission: RE | Admit: 2020-02-01 | Discharge: 2020-02-01 | Disposition: A | Discharge status: Home / Self Care | Payer: BC Managed Care – PPO | Source: Ambulatory Visit | Attending: Obstetrics and Gynecology | Admitting: Obstetrics and Gynecology

## 2020-02-01 ENCOUNTER — Encounter: Payer: Self-pay | Admitting: Obstetrics and Gynecology

## 2020-02-01 ENCOUNTER — Ambulatory Visit: Payer: BC Managed Care – PPO | Admitting: Obstetrics and Gynecology

## 2020-02-01 ENCOUNTER — Other Ambulatory Visit: Payer: Self-pay

## 2020-02-01 VITALS — BP 104/60 | HR 88 | Temp 98.1°F | Resp 16 | Ht 63.25 in | Wt 160.0 lb

## 2020-02-01 DIAGNOSIS — N640 Fissure and fistula of nipple: Secondary | ICD-10-CM | POA: Diagnosis not present

## 2020-02-01 DIAGNOSIS — Z01419 Encounter for gynecological examination (general) (routine) without abnormal findings: Secondary | ICD-10-CM | POA: Diagnosis not present

## 2020-02-01 MED ORDER — ETONOGESTREL-ETHINYL ESTRADIOL 0.12-0.015 MG/24HR VA RING: 1.0000 | VAGINAL_RING | VAGINAL | 3 refills | Status: DC

## 2020-02-01 NOTE — Patient Instructions (Signed)

## 2020-02-03 ENCOUNTER — Telehealth: Payer: Self-pay | Admitting: Obstetrics and Gynecology

## 2020-02-03 LAB — CYTOLOGY - PAP
Comment: NEGATIVE
Diagnosis: NEGATIVE
High risk HPV: NEGATIVE

## 2020-02-03 MED ORDER — NONFORMULARY OR COMPOUNDED ITEM: 0 refills | Status: DC

## 2020-02-03 NOTE — Telephone Encounter (Addendum)
Rx for all purpose nipple cream with ibuprofen Apply pea size amount to nipples tid for 2 weeks. Disp:  30 grams.  RF:  None written and to Dr.Silva's desk for review.

## 2020-02-03 NOTE — Telephone Encounter (Signed)
Received Rx and will fax to [facility] today.   Left detailed message re: Rx and how to use. [facility] to call when Rx ready. Pt to call if any questions.   Routed to Dr Quincy Simmonds for final review and will close encounter.

## 2020-02-03 NOTE — Telephone Encounter (Signed)
Please send a prescription to [facility] for my patient.   She needs all purpose nipple cream with ibuprofen Apply pea size amount to nipples tid for 2 weeks.  Disp:  30 grams.  RF:  None.   I can sign the prescription this afternoon.

## 2020-02-15 ENCOUNTER — Ambulatory Visit: Payer: BC Managed Care – PPO | Admitting: Obstetrics and Gynecology

## 2020-02-20 ENCOUNTER — Telehealth: Payer: Self-pay

## 2020-02-20 NOTE — Telephone Encounter (Signed)
Left message for patient to reschedule 2 week fu appointment for Dr.silva.

## 2020-02-23 ENCOUNTER — Ambulatory Visit: Payer: BC Managed Care – PPO | Admitting: Obstetrics and Gynecology

## 2020-03-15 ENCOUNTER — Other Ambulatory Visit: Payer: Self-pay

## 2020-03-15 ENCOUNTER — Encounter: Payer: Self-pay | Admitting: Obstetrics and Gynecology

## 2020-03-15 ENCOUNTER — Ambulatory Visit: Payer: BC Managed Care – PPO | Admitting: Obstetrics and Gynecology

## 2020-03-15 VITALS — BP 102/60 | HR 60 | Temp 98.0°F | Ht 63.0 in | Wt 159.0 lb

## 2020-03-15 DIAGNOSIS — L309 Dermatitis, unspecified: Secondary | ICD-10-CM

## 2020-03-15 NOTE — Progress Notes (Signed)
GYNECOLOGY  VISIT   HPI: 34 y.o.   Single  Caucasian  female   [id number] with Patient's last menstrual period was 03/15/2020.   here for 2 week follow-up of breast nipple cracking.   She has used nipple cream.  She is using the cream twice daily.   She has a rash on her arms which is irritating.  She will see Dr. Earnest Conroy in follow up.   GYNECOLOGIC HISTORY: Patient's last menstrual period was 03/15/2020. Contraception:  NuvaRing vaginal inserts Menopausal hormone therapy:  n/a Last mammogram:  n/a Last pap smear:   02/01/20 Neg        02/21/16 Neg:Neg HR HPV        OB History    Gravida  0   Para  0   Term  0   Preterm  0   AB      Living  0     SAB      TAB      Ectopic      Multiple      Live Births                 Patient Active Problem List   Diagnosis Date Noted  . Overweight (BMI 25.0-29.9) 11/30/2017  . Diabetes mellitus without complication ([place]) 73/41/9379  . Hypertriglyceridemia 04/23/2016  . GERD 11/20/2010  . FATTY LIVER DISEASE 04/17/2008  . Asthma, persistent controlled 11/19/2007    Past Medical History:  Diagnosis Date  . Allergy   . Anxiety   . ASTHMA 11/19/2007  . CHICKENPOX 11/19/2007  . Diabetes mellitus without complication (H[facility])    diet controlled  . Duodenitis   . Dysmenorrhea 11/19/2007  . DYSPHAGIA 11/19/2010  . ECZEMA 11/19/2007  . Esophageal stricture   . Esophagitis   . FATTY LIVER DISEASE 04/17/2008  . GERD 11/20/2010  . Hyperlipidemia   . IBS (irritable bowel syndrome)   . Infection of eyelid 08/07/2011  . Low vitamin D level 2018  . Other and unspecified noninfectious gastroenteritis and colitis(558.9) 02/2014  . RHINOSINUSITIS, CHRONIC 12/09/2007  . TB SKIN TEST, POSITIVE 11/19/2007    Past Surgical History:  Procedure Laterality Date  . CT SINUS LTD W/O CM  09/23/07  . Mole excision     x's 4    Current Outpatient Medications  Medication Sig Dispense Refill  . blood glucose meter kit and supplies Dispense based  on patient and insurance preference. Use up to four times daily as directed. DX E11.9 1 each 11  . EPINEPHrine 0.3 mg/0.3 mL IJ SOAJ injection Inject 0.3 mg into the muscle once.    . etonogestrel-ethinyl estradiol (NUVARING) 0.12-0.015 MG/24HR vaginal ring Place 1 each vaginally every 28 (twenty-eight) days. Insert vaginally and leave in place for 3 consecutive weeks, then remove for 1 week. 3 each 3  . fenofibrate (TRICOR) 145 MG tablet Take 1 tablet (145 mg total) by mouth daily. 90 tablet 1  . glipiZIDE (GLUCOTROL) 10 MG tablet Take 1 tablet (10 mg total) by mouth daily. 90 tablet 1  . levocetirizine (XYZAL) 5 MG tablet TAKE 1 TABLET BY MOUTH EVERY DAY IN THE EVENING 90 tablet 0  . metFORMIN (GLUCOPHAGE) 1000 MG tablet Take 1 tablet (1,000 mg total) by mouth 2 (two) times daily with a meal. 180 tablet 1  . montelukast (SINGULAIR) 10 MG tablet every evening.    . NONFORMULARY OR COMPOUNDED ITEM All purpose nipple cream with ibuprofen. Apply pea size amount to nipples tid for 2 weeks.  Disp:  30 grams.  RF:  None. 1 each 0  . SYMBICORT 160-4.5 MCG/ACT inhaler Inhale 2 puffs into the lungs 2 (two) times daily.  3  . tacrolimus (PROTOPIC) 0.1 % ointment Apply topically 2 (two) times daily. (Patient taking differently: Apply 1 application topically 2 (two) times daily as needed (for rash, skin issues). ) 100 g 1   No current facility-administered medications for this visit.     ALLERGIES: Vancomycin and Januvia [sitagliptin]  Family History  Adopted: Yes    Social History   Socioeconomic History  . Marital status: Single    Spouse name: Not on file  . Number of children: 0  . Years of education: Not on file  . Highest education level: Not on file  Occupational History  . Occupation: Product manager: Psychologist, sport and exercise [employer]  Tobacco Use  . Smoking status: Never Smoker  . Smokeless tobacco: Never Used  Substance and Sexual Activity  . Alcohol use: Yes    Alcohol/week: 1.0  standard drinks    Types: 1 Standard drinks or equivalent per week    Comment: SOCIALLY  . Drug use: No  . Sexual activity: Yes    Partners: Male    Birth control/protection: Pill    Comment: Hulda Humphrey  Other Topics Concern  . Not on file  Social History Narrative   Adopted from [place].    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   Drinks caffeine, uses herbal remedies   Wears her seatbelt, wears bicycle helmet   Reports routine exercise    Smoke detector in the home.   Feels safe in relationships.     Social Determinants of Health   Financial Resource Strain:   . Difficulty of Paying Living Expenses:   Food Insecurity:   . Worried About Charity fundraiser in the Last Year:   . Arboriculturist in the Last Year:   Transportation Needs:   . Film/video editor (Medical):   Marland Kitchen Lack of Transportation (Non-Medical):   Physical Activity:   . Days of Exercise per Week:   . Minutes of Exercise per Session:   Stress:   . Feeling of Stress :   Social Connections:   . Frequency of Communication with Friends and Family:   . Frequency of Social Gatherings with Friends and Family:   . Attends Religious Services:   . Active Member of Clubs or Organizations:   . Attends Archivist Meetings:   Marland Kitchen Marital Status:   Intimate Partner Violence:   . Fear of Current or Ex-Partner:   . Emotionally Abused:   Marland Kitchen Physically Abused:   . Sexually Abused:     Review of Systems  Constitutional: Negative.   HENT: Negative.   Eyes: Negative.   Respiratory: Negative.   Cardiovascular: Negative.   Gastrointestinal: Negative.   Endocrine: Negative.   Genitourinary: Negative.   Musculoskeletal: Negative.   Skin: Negative.   Allergic/Immunologic: Negative.   Neurological: Negative.   Hematological: Negative.   Psychiatric/Behavioral: Negative.     PHYSICAL EXAMINATION:    BP 102/60 (BP Location: Right Arm, Patient Position: Sitting,  Cuff Size: Normal)   Pulse 60   Temp 98 F (36.7 C) (Temporal)   Ht '5\' 3"'  (1.6 m)   Wt 159 lb (72.1 kg)   LMP 03/15/2020   BMI 28.17 kg/m     General appearance: alert, cooperative and appears stated age  Breasts: normal appearance, no masses or tenderness, No nipple retraction or dimpling, no nipple discharge or bleeding, mild white skin color change at nipple bases, no axillary or supraclavicular adenopathy  Chaperone was present for exam.  ASSESSMENT  Nipple irritation/dermatitis.  I am not concerned about breast inflammatory malignancy.  PLAN  Ok to use nipple cream tid prn.  She will see dermatology for her arm skin rash.  FU for annual exam and prn.    An After Visit Summary was printed and given to the patient.  __15____ minutes face to face time of which over 50% was spent in counseling.

## 2020-04-10 LAB — HM DIABETES EYE EXAM

## 2020-05-30 ENCOUNTER — Ambulatory Visit: Payer: BC Managed Care – PPO | Admitting: Physician Assistant

## 2020-05-30 ENCOUNTER — Encounter: Payer: Self-pay | Admitting: Physician Assistant

## 2020-05-30 ENCOUNTER — Other Ambulatory Visit: Payer: Self-pay

## 2020-05-30 DIAGNOSIS — L2089 Other atopic dermatitis: Secondary | ICD-10-CM | POA: Diagnosis not present

## 2020-05-30 MED ORDER — TRIAMCINOLONE ACETONIDE 0.1 % EX CREA: 1.0000 "application " | TOPICAL_CREAM | Freq: Two times a day (BID) | CUTANEOUS | 2 refills | Status: DC | PRN

## 2020-05-30 MED ORDER — HYDROXYZINE HCL 25 MG PO TABS: 25.0000 mg | ORAL_TABLET | Freq: Every day | ORAL | 0 refills | Status: DC

## 2020-05-30 NOTE — Progress Notes (Signed)
   Follow up Visit  Subjective  Brenda Rosales is a 34 y.o. female who presents for the following: Rash (started a week ago and getting worse, denies new medication or exposures, aveeno bath, benadryl, gold bond medicated itch powder, history of eczema taking protopic). Started in elbow creases like her regular eczema patches but then spread all over. She has been using   Objective  Well appearing patient in no apparent distress; mood and affect are within normal limits.  A focused examination was performed including abdomen, back, arms and legs. Relevant physical exam findings are noted in the Assessment and Plan.   Objective  Left Breast, Left Forearm - Anterior, Left Lower Back, Left Popliteal Fossa, Right Abdomen (side) - Lower, Right Breast, Right Forearm - Anterior, Right Popliteal Fossa: Thin scaly erythematous papules coalescing to plaques. She has a hisotyr of eczema but this flare is mch worse than usual.   Assessment & Plan  Other atopic dermatitis (8) Left Popliteal Fossa; Right Popliteal Fossa; Left Forearm - Anterior; Right Forearm - Anterior; Left Breast; Right Breast; Right Abdomen (side) - Lower; Left Lower Back  Discussed possibility of contact component. She will change her laundry and go to vanicream bar and not let her hair products rinse down her body. Warned of drowsiness with atarax and to only take at night. She is to send a message with a progress report in 1 week. Also encouraged her to do cool, wet compress  Ordered Medications: hydrOXYzine (ATARAX/VISTARIL) 25 MG tablet triamcinolone cream (KENALOG) 0.1 %

## 2020-06-14 ENCOUNTER — Encounter: Payer: Self-pay | Admitting: Physician Assistant

## 2020-06-21 ENCOUNTER — Other Ambulatory Visit: Payer: Self-pay | Admitting: Physician Assistant

## 2020-06-21 DIAGNOSIS — L2089 Other atopic dermatitis: Secondary | ICD-10-CM

## 2020-08-24 ENCOUNTER — Other Ambulatory Visit: Payer: Self-pay | Admitting: Family Medicine

## 2020-11-19 ENCOUNTER — Other Ambulatory Visit: Payer: Self-pay

## 2020-11-20 ENCOUNTER — Encounter: Payer: Self-pay | Admitting: Family Medicine

## 2020-11-20 ENCOUNTER — Ambulatory Visit (INDEPENDENT_AMBULATORY_CARE_PROVIDER_SITE_OTHER): Payer: BC Managed Care – PPO | Admitting: Family Medicine

## 2020-11-20 ENCOUNTER — Ambulatory Visit
Admission: RE | Admit: 2020-11-20 | Discharge: 2020-11-20 | Disposition: A | Discharge status: Home / Self Care | Payer: BC Managed Care – PPO | Source: Ambulatory Visit | Attending: Family Medicine | Admitting: Family Medicine

## 2020-11-20 VITALS — BP 112/72 | HR 73 | Temp 98.5°F | Ht 63.0 in | Wt 159.0 lb

## 2020-11-20 DIAGNOSIS — M79671 Pain in right foot: Secondary | ICD-10-CM

## 2020-11-20 DIAGNOSIS — J301 Allergic rhinitis due to pollen: Secondary | ICD-10-CM | POA: Insufficient documentation

## 2020-11-20 DIAGNOSIS — H1045 Other chronic allergic conjunctivitis: Secondary | ICD-10-CM | POA: Insufficient documentation

## 2020-11-20 DIAGNOSIS — L209 Atopic dermatitis, unspecified: Secondary | ICD-10-CM | POA: Insufficient documentation

## 2020-11-20 DIAGNOSIS — J4540 Moderate persistent asthma, uncomplicated: Secondary | ICD-10-CM | POA: Insufficient documentation

## 2020-11-20 DIAGNOSIS — J3081 Allergic rhinitis due to animal (cat) (dog) hair and dander: Secondary | ICD-10-CM | POA: Insufficient documentation

## 2020-11-20 MED ORDER — METFORMIN HCL 1000 MG PO TABS: 1000.0000 mg | ORAL_TABLET | Freq: Two times a day (BID) | ORAL | 0 refills | Status: DC

## 2020-11-20 MED ORDER — FENOFIBRATE 145 MG PO TABS: 145.0000 mg | ORAL_TABLET | Freq: Every day | ORAL | 0 refills | Status: DC

## 2020-11-20 MED ORDER — GLIPIZIDE 10 MG PO TABS: 10.0000 mg | ORAL_TABLET | Freq: Every day | ORAL | 0 refills | Status: DC

## 2020-11-20 MED ORDER — NAPROXEN 500 MG PO TABS: 500.0000 mg | ORAL_TABLET | Freq: Two times a day (BID) | ORAL | 0 refills | Status: DC

## 2020-11-20 NOTE — Patient Instructions (Signed)
[employer].  Naproxen every 12 hours with food.   Wear boot  We will call you with results.

## 2020-11-20 NOTE — Progress Notes (Signed)
This visit occurred during the SARS-CoV-2 public health emergency.  Safety protocols were in place, including screening questions prior to the visit, additional usage of staff PPE, and extensive cleaning of exam room while observing appropriate contact time as indicated for disinfecting solutions.    Brenda Rosales , 12-08-1985, 35 y.o., female MRN: [id number] Patient Care Team    Relationship Specialty Notifications Start End  Ma Hillock, DO PCP - General Family Medicine  04/23/16    Comment: Patient request  Nunzio Cobbs, MD Consulting Physician Obstetrics and Gynecology  04/23/16   Deneise Lever, MD Consulting Physician Pulmonary Disease  04/23/16   Irene Shipper, MD Consulting Physician Gastroenterology  04/23/16   Danice Goltz, MD Consulting Physician Ophthalmology  02/10/17   Janne Napoleon, PA-C (Inactive)  Dermatology  05/30/20     Chief Complaint  Patient presents with  . Foot Pain    Pt c/o throbbing rt foot pain x 2 days; pt has recently increase her activity     Subjective: Pt presents for an OV with complaints of right foot pain of 2-3 days duration. She has recently started running again. She does not recall a specific injury. Pain started on Saturday on the bottom aspect of her foot. By Sunday night it was throbbing and more painful.  She reports plantar aspect between the ball of her 1st and 2nd toe is hurts when bearing weight. She started ibuprofen 400 mg every 4-6 hours, which did help with pain. She was icing the area. She reports it had been a little "puffy" but no redness. She has not injured this area in the past.  Depression screen [facility] 2/9 11/20/2020 06/09/2018 11/30/2017 08/14/2017 04/23/2016  Decreased Interest 0 0 0 0 0  Down, Depressed, Hopeless 0 0 0 0 0  PHQ - 2 Score 0 0 0 0 0    Allergies  Allergen Reactions  . Vancomycin Other (See Comments)    redmans syndrome   . Januvia [Sitagliptin] Other (See Comments)     Dizziness   Social History   Social History Narrative   Adopted from [place].    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   Drinks caffeine, uses herbal remedies   Wears her seatbelt, wears bicycle helmet   Reports routine exercise    Smoke detector in the home.   Feels safe in relationships.     Past Medical History:  Diagnosis Date  . Allergy   . Anxiety   . ASTHMA 11/19/2007  . CHICKENPOX 11/19/2007  . Diabetes mellitus without complication (HCC)    diet controlled  . Duodenitis   . Dysmenorrhea 11/19/2007  . DYSPHAGIA 11/19/2010  . ECZEMA 11/19/2007  . Esophageal stricture   . Esophagitis   . FATTY LIVER DISEASE 04/17/2008  . GERD 11/20/2010  . Hyperlipidemia   . IBS (irritable bowel syndrome)   . Infection of eyelid 08/07/2011  . Low vitamin D level 2018  . Other and unspecified noninfectious gastroenteritis and colitis(558.9) 02/2014  . RHINOSINUSITIS, CHRONIC 12/09/2007  . TB SKIN TEST, POSITIVE 11/19/2007   Past Surgical History:  Procedure Laterality Date  . CT SINUS LTD W/O CM  09/23/07  . Mole excision     x's 4   Family History  Adopted: Yes   Allergies as of 11/20/2020      Reactions   Vancomycin Other (See Comments)   redmans syndrome    Januvia [  sitagliptin] Other (See Comments)   Dizziness      Medication List       Accurate as of November 20, 2020  1:53 PM. If you have any questions, ask your nurse or doctor.        blood glucose meter kit and supplies Dispense based on patient and insurance preference. Use up to four times daily as directed. DX E11.9   EPINEPHrine 0.3 mg/0.3 mL Soaj injection Commonly known as: EPI-PEN Inject 0.3 mg into the muscle once.   etonogestrel-ethinyl estradiol 0.12-0.015 MG/24HR vaginal ring Commonly known as: NuvaRing Place 1 each vaginally every 28 (twenty-eight) days. Insert vaginally and leave in place for 3 consecutive weeks, then remove for 1 week.   fenofibrate 145 MG  tablet Commonly known as: TRICOR Take 1 tablet (145 mg total) by mouth daily.   glipiZIDE 10 MG tablet Commonly known as: GLUCOTROL Take 1 tablet (10 mg total) by mouth daily.   hydrOXYzine 25 MG tablet Commonly known as: ATARAX/VISTARIL TAKE 1 TABLET BY MOUTH EVERYDAY AT BEDTIME   levocetirizine 5 MG tablet Commonly known as: XYZAL TAKE 1 TABLET BY MOUTH EVERY DAY IN THE EVENING   metFORMIN 1000 MG tablet Commonly known as: GLUCOPHAGE Take 1 tablet (1,000 mg total) by mouth 2 (two) times daily with a meal.   montelukast 10 MG tablet Commonly known as: SINGULAIR every evening.   naproxen 500 MG tablet Commonly known as: Naprosyn Take 1 tablet (500 mg total) by mouth 2 (two) times daily with a meal. Started by: Howard Pouch, DO   NONFORMULARY OR COMPOUNDED ITEM All purpose nipple cream with ibuprofen. Apply pea size amount to nipples tid for 2 weeks.  Disp:  30 grams.  RF:  None.   Symbicort 160-4.5 MCG/ACT inhaler Generic drug: budesonide-formoterol Inhale 2 puffs into the lungs 2 (two) times daily.   tacrolimus 0.1 % ointment Commonly known as: Protopic Apply topically 2 (two) times daily. What changed:   how much to take  when to take this  reasons to take this   triamcinolone 0.1 % Commonly known as: KENALOG Apply 1 application topically 2 (two) times daily as needed.       All past medical history, surgical history, allergies, family history, immunizations andmedications were updated in the EMR today and reviewed under the history and medication portions of their EMR.     ROS: Negative, with the exception of above mentioned in HPI   Objective:  BP 112/72   Pulse 73   Temp 98.5 F (36.9 C) (Oral)   Ht '5\' 3"'  (1.6 m)   Wt 159 lb (72.1 kg)   SpO2 98%   BMI 28.17 kg/m  Body mass index is 28.17 kg/m. Gen: Afebrile. No acute distress. Nontoxic in appearance, well developed, well nourished.  HENT: AT. [place]. Eyes:Pupils Equal Round Reactive to  light, Extraocular movements intact,  Conjunctiva without redness, discharge or icterus. MSK: no erythema, not soft tissue swelling. TTP 1st metatarsal head.  Skin: no rashes, purpura or petechiae. No bruising.  Neuro:mild limp today. Alert. Oriented x3  Psych: Normal affect, dress and demeanor. Normal speech. Normal thought content and judgment.  No exam data present No results found. No results found for this or any previous visit (from the past 24 hour(s)).  Assessment/Plan: Brenda Rosales is a 35 y.o. female present for OV for  Right foot pain TTP medial plantar aspect of 1st metatarsal head. ? Avulsion or stress fracture vs strain.  - DG Foot Complete Right;  Future - DG Toe Great Right; Future Post op shoe to decrease weight bearing.  Naproxen BID with food prescribed.  Rest. Elevate ice.  F/u and plan  dependent upon xray results. Pt will be called with plan and results.   Pt was encouraged to follow up for DM/CMC in the next few weeks she is a year over due.    Reviewed expectations re: course of current medical issues.  Discussed self-management of symptoms.  Outlined signs and symptoms indicating need for more acute intervention.  Patient verbalized understanding and all questions were answered.  Patient received an After-Visit Summary.    Orders Placed This Encounter  Procedures  . DG Foot Complete Right  . DG Toe Great Right   Meds ordered this encounter  Medications  . fenofibrate (TRICOR) 145 MG tablet    Sig: Take 1 tablet (145 mg total) by mouth daily.    Dispense:  30 tablet    Refill:  0    Pt due for f/u with PCP  . glipiZIDE (GLUCOTROL) 10 MG tablet    Sig: Take 1 tablet (10 mg total) by mouth daily.    Dispense:  30 tablet    Refill:  0    F/u needed  . metFORMIN (GLUCOPHAGE) 1000 MG tablet    Sig: Take 1 tablet (1,000 mg total) by mouth 2 (two) times daily with a meal.    Dispense:  60 tablet    Refill:  0    F/u needed  . naproxen  (NAPROSYN) 500 MG tablet    Sig: Take 1 tablet (500 mg total) by mouth 2 (two) times daily with a meal.    Dispense:  30 tablet    Refill:  0   Referral Orders  No referral(s) requested today     Note is dictated utilizing voice recognition software. Although note has been proof read prior to signing, occasional typographical errors still can be missed. If any questions arise, please do not hesitate to call for verification.   electronically signed by:  Howard Pouch, DO  [place]

## 2020-12-05 ENCOUNTER — Ambulatory Visit: Payer: BC Managed Care – PPO | Admitting: Family Medicine

## 2020-12-10 ENCOUNTER — Encounter: Payer: Self-pay | Admitting: Family Medicine

## 2020-12-10 ENCOUNTER — Ambulatory Visit: Payer: BC Managed Care – PPO | Admitting: Family Medicine

## 2020-12-10 ENCOUNTER — Other Ambulatory Visit: Payer: Self-pay

## 2020-12-10 VITALS — BP 110/71 | HR 78 | Temp 98.3°F | Ht 63.0 in | Wt 159.0 lb

## 2020-12-10 DIAGNOSIS — E663 Overweight: Secondary | ICD-10-CM

## 2020-12-10 DIAGNOSIS — J45998 Other asthma: Secondary | ICD-10-CM

## 2020-12-10 DIAGNOSIS — E781 Pure hyperglyceridemia: Secondary | ICD-10-CM | POA: Diagnosis not present

## 2020-12-10 DIAGNOSIS — E119 Type 2 diabetes mellitus without complications: Secondary | ICD-10-CM | POA: Diagnosis not present

## 2020-12-10 LAB — POCT GLYCOSYLATED HEMOGLOBIN (HGB A1C)
HbA1c POC (<> result, manual entry): 9.1 % (ref 4.0–5.6)
HbA1c, POC (controlled diabetic range): 9.1 % — AB (ref 0.0–7.0)
HbA1c, POC (prediabetic range): 9.1 % — AB (ref 5.7–6.4)
Hemoglobin A1C: 9.1 % — AB (ref 4.0–5.6)

## 2020-12-10 MED ORDER — METFORMIN HCL 1000 MG PO TABS: 1000.0000 mg | ORAL_TABLET | Freq: Two times a day (BID) | ORAL | 1 refills | Status: DC

## 2020-12-10 MED ORDER — GLIPIZIDE 10 MG PO TABS
10.0000 mg | ORAL_TABLET | Freq: Every day | ORAL | 3 refills | Status: DC
Start: 2020-12-10 — End: 2021-02-21

## 2020-12-10 MED ORDER — FENOFIBRATE 145 MG PO TABS
145.0000 mg | ORAL_TABLET | Freq: Every day | ORAL | 3 refills | Status: DC
Start: 2020-12-10 — End: 2021-10-21

## 2020-12-10 NOTE — Patient Instructions (Signed)
Great to see you today.  Follow up in 3-4 months on chronic conditions.

## 2020-12-10 NOTE — Progress Notes (Signed)
Patient ID: Brenda Rosales, female   DOB: October 26, 1986, 35 y.o.   MRN: [id number]    Patient Care Team    Relationship Specialty Notifications Start End  Ma Hillock, DO PCP - General Family Medicine  04/23/16    Comment: Patient request  Nunzio Cobbs, MD Consulting Physician Obstetrics and Gynecology  04/23/16   Deneise Lever, MD Consulting Physician Pulmonary Disease  04/23/16   Irene Shipper, MD Consulting Physician Gastroenterology  04/23/16   Janne Napoleon, PA-C (Inactive)  Dermatology  05/30/20     Chief Complaint  Patient presents with  . Foot Pain    Pain improving   . Follow-up    [facility]    Subjective: Brenda Rosales is a 35 y.o. female present for follow up on diabetes.  Diabetes/obesity:  Pt reports she has been compliance with with  metformin 1000 mg BID and only sometimes taking  Glipizide 10 mg QD. She has been lost to her diabteic follow up over the last 12  Months. Patient denies dizziness, hyperglycemic or hypoglycemic events. Patient denies numbness, tingling in the extremities or nonhealing wounds of feet.  Januvia  Caused dizziness.  A1c: 6.0 --> 6.6--> 7.4 --> 6.7--> 7.3--> 8.0--> 8.0 >> 8.9 >>> 9.1>> 6.5>9.1 today.  Urine Microalbumin w/creat.: 04/2019 >> WNL>> collected today - diabetic diet and routine exercise encouraged.  - eye exam:  [facility] she will call to have them forward 06/2020 - foot exam completed 12/10/2020 - flu shot UTD 2021 - CBC, CMP, TSH, lipid collected today (not fasting- only 1 mo fenofibrate) - PPSV 23 completed 06/09/18, prevnar 10/2016   Recent Results (from the past 2160 hour(s))  POCT HgB A1C     Status: Abnormal   Collection Time: 12/10/20  3:13 PM  Result Value Ref Range   Hemoglobin A1C 9.1 (A) 4.0 - 5.6 %   HbA1c POC (<> result, manual entry) 9.1 4.0 - 5.6 %   HbA1c, POC (prediabetic range) 9.1 (A) 5.7 - 6.4 %   HbA1c, POC (controlled diabetic range) 9.1 (A) 0.0 - 7.0 %    Immunization  History  Administered Date(s) Administered  . Influenza Inj Mdck Quad With Preservative 08/19/2018  . Influenza Whole 08/19/2009  . Influenza,inj,Quad PF,6+ Mos 07/31/2016, 08/02/2019  . Influenza-Unspecified 08/11/2015, 07/12/2017, 07/11/2020  . Moderna Sars-Covid-2 Vaccination 01/07/2020, 02/04/2020, 09/19/2020  . Pneumococcal Conjugate-13 11/06/2016  . Pneumococcal Polysaccharide-23 07/23/2007, 06/09/2018  . Td 09/11/2004  . Tdap 07/04/2012    Past Medical History:  Diagnosis Date  . Allergy   . Anxiety   . ASTHMA 11/19/2007  . CHICKENPOX 11/19/2007  . Diabetes mellitus without complication (HCC)    diet controlled  . Duodenitis   . Dysmenorrhea 11/19/2007  . DYSPHAGIA 11/19/2010  . ECZEMA 11/19/2007  . Esophageal stricture   . Esophagitis   . FATTY LIVER DISEASE 04/17/2008  . GERD 11/20/2010  . Hyperlipidemia   . IBS (irritable bowel syndrome)   . Infection of eyelid 08/07/2011  . Low vitamin D level 2018  . Other and unspecified noninfectious gastroenteritis and colitis(558.9) 02/2014  . RHINOSINUSITIS, CHRONIC 12/09/2007  . TB SKIN TEST, POSITIVE 11/19/2007   Allergies  Allergen Reactions  . Vancomycin Other (See Comments)    redmans syndrome   . Januvia [Sitagliptin] Other (See Comments)    Dizziness   Past Surgical History:  Procedure Laterality Date  . CT SINUS LTD W/O CM  09/23/07  . Mole excision     x's 4  Family History  Adopted: Yes   Social History   Socioeconomic History  . Marital status: Single    Spouse name: Not on file  . Number of children: 0  . Years of education: Not on file  . Highest education level: Not on file  Occupational History  . Occupation: Product manager: Psychologist, sport and exercise [employer]  Tobacco Use  . Smoking status: Never Smoker  . Smokeless tobacco: Never Used  Vaping Use  . Vaping Use: Never used  Substance and Sexual Activity  . Alcohol use: Yes    Alcohol/week: 1.0 standard drink    Types: 1 Standard drinks or equivalent per  week    Comment: SOCIALLY  . Drug use: No  . Sexual activity: Yes    Partners: Male    Birth control/protection: Pill    Comment: Hulda Humphrey  Other Topics Concern  . Not on file  Social History Narrative   Adopted from [place].    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   Drinks caffeine, uses herbal remedies   Wears her seatbelt, wears bicycle helmet   Reports routine exercise    Smoke detector in the home.   Feels safe in relationships.     Social Determinants of Health   Financial Resource Strain: Not on file  Food Insecurity: Not on file  Transportation Needs: Not on file  Physical Activity: Not on file  Stress: Not on file  Social Connections: Not on file  Intimate Partner Violence: Not on file     ROS: Negative, with the exception of above mentioned in HPI  Objective: BP 110/71   Pulse 78   Temp 98.3 F (36.8 C) (Oral)   Ht 5\' 3"  (1.6 m)   Wt 159 lb (72.1 kg)   SpO2 97%   BMI 28.17 kg/m  Gen: Afebrile. No acute distress. Nontoxic. Pleasant female.  HENT: AT. [place].  Eyes:Pupils Equal Round Reactive to light, Extraocular movements intact,  Conjunctiva without redness, discharge or icterus. Neck/lymp/endocrine: Supple,no lymphadenopathy, no thyromegaly CV: RRR no murmur, no edema, +2/4 P posterior tibialis pulses Chest: CTAB, no wheeze or crackles Abd: Soft. NTND. BS presentn. no Masses palpated.  Skin: no rashes, purpura or petechiae.  Neuro:  Normal gait. PERLA. EOMi. Alert. Oriented x3 Psych: Normal affect, dress and demeanor. Normal speech. Normal thought content and judgment.    Results for orders placed or performed in visit on 12/10/20 (from the past 24 hour(s))  POCT HgB A1C     Status: Abnormal   Collection Time: 12/10/20  3:13 PM  Result Value Ref Range   Hemoglobin A1C 9.1 (A) 4.0 - 5.6 %   HbA1c POC (<> result, manual entry) 9.1 4.0 - 5.6 %   HbA1c, POC (prediabetic range) 9.1 (A) 5.7 - 6.4 %   HbA1c,  POC (controlled diabetic range) 9.1 (A) 0.0 - 7.0 %    Assessment/plan: Brenda Rosales is a 35 y.o. female present for  Diabetes mellitus without complication (HCC)/overweight - lost to folllowup and now uncontrolled.  - Continue Metformin 1000 BID. - restart   glipizide to 10 mg QD.  -Tried medications: Januvia 100 mg QD prescribed>> upset her stomach  - she is agreeable to start monitoring her BG. She was instructed to monitor fasting BG only for now and record. Educated on normal values- desire at least 100-120 FBG A1c: 6.0 --> 6.6--> 7.4 --> 6.7--> 7.3--> 8.0--> 8.0 >> 8.9 >>> 9.1>>>6.5 (lost to follow  up)> 9.1 today   Urine Microalbumin w/creat.: ordered today - diabetic diet and routine exercise encouraged.  - [facility]: [facility] on [address]. 06/2020 - foot exam completed 12/10/2020 - flu shot UTD 2021 - CBC, CMP, TSH, lipid collected  (not fasting- only 1 mo fenofibrate) - PPSV 23 completed 06/09/18, prevnar 10/2016  - Follow-up every 3 months  Hyperlipidemia:  - prescribed tricor>  tg 491 04/2019> collected today- still not fasting. Only taking tricor for 1 months.   Of note: foot pain has resolved.    Return in about 3 months (around 03/09/2021) for CMC (30 min).  Meds ordered this encounter  Medications  . glipiZIDE (GLUCOTROL) 10 MG tablet    Sig: Take 1 tablet (10 mg total) by mouth daily.    Dispense:  90 tablet    Refill:  3  . fenofibrate (TRICOR) 145 MG tablet    Sig: Take 1 tablet (145 mg total) by mouth daily.    Dispense:  90 tablet    Refill:  3  . metFORMIN (GLUCOPHAGE) 1000 MG tablet    Sig: Take 1 tablet (1,000 mg total) by mouth 2 (two) times daily with a meal.    Dispense:  180 tablet    Refill:  1   Orders Placed This Encounter  Procedures  . Microalbumin / creatinine urine ratio  . Basic Metabolic Panel (BMET)  . TSH  . Lipid panel  . POCT HgB A1C     Electronically signed by: Howard Pouch, [place]

## 2020-12-11 ENCOUNTER — Telehealth: Payer: Self-pay | Admitting: Family Medicine

## 2020-12-11 DIAGNOSIS — E1169 Type 2 diabetes mellitus with other specified complication: Secondary | ICD-10-CM

## 2020-12-11 DIAGNOSIS — E119 Type 2 diabetes mellitus without complications: Secondary | ICD-10-CM

## 2020-12-11 DIAGNOSIS — Z79899 Other long term (current) drug therapy: Secondary | ICD-10-CM

## 2020-12-11 LAB — BASIC METABOLIC PANEL
BUN: 10 mg/dL (ref 7–25)
CO2: 19 mmol/L — ABNORMAL LOW (ref 20–32)
Calcium: 9.8 mg/dL (ref 8.6–10.2)
Chloride: 103 mmol/L (ref 98–110)
Creat: 0.69 mg/dL (ref 0.50–1.10)
Glucose, Bld: 145 mg/dL — ABNORMAL HIGH (ref 65–99)
Potassium: 4.1 mmol/L (ref 3.5–5.3)
Sodium: 136 mmol/L (ref 135–146)

## 2020-12-11 LAB — MICROALBUMIN / CREATININE URINE RATIO
Creatinine, Urine: 67 mg/dL (ref 20–275)
Microalb Creat Ratio: 6 mcg/mg creat (ref <30)
Microalb, Ur: 0.4 mg/dL

## 2020-12-11 LAB — LIPID PANEL
Cholesterol: 263 mg/dL — ABNORMAL HIGH (ref <200)
HDL: 50 mg/dL (ref >=50)
LDL Cholesterol (Calc): 167 mg/dL (calc) — ABNORMAL HIGH
Non-HDL Cholesterol (Calc): 213 mg/dL (calc) — ABNORMAL HIGH (ref <130)
Total CHOL/HDL Ratio: 5.3 (calc) — ABNORMAL HIGH (ref <5.0)
Triglycerides: 279 mg/dL — ABNORMAL HIGH (ref <150)

## 2020-12-11 LAB — TSH: TSH: 3.03 mIU/L

## 2020-12-11 MED ORDER — ROSUVASTATIN CALCIUM 20 MG PO TABS: 20.0000 mg | ORAL_TABLET | Freq: Every day | ORAL | 3 refills | Status: DC

## 2020-12-11 NOTE — Telephone Encounter (Signed)
Please call patient Kidney function and electrolytes are normal  Thyroid is functioning normal Urinalysis is negative for protein Cholesterol panel was very high, with a total cholesterol 263 (goal less than 200 (and LDL/bad cholesterol) 167 (goal less than 100).  These levels are much higher than her prior studies-with her diabetes being uncontrolled and her cholesterol being at this level a medication for cholesterol is needed.  The levels above put her at high risk for heart attack or stroke.   -I called in a medicine called Crestor.  This helps lower her cholesterol and LDL/bad cholesterol and provides cardiovascular protection.  It is taken before bed-I would encourage her to make this part of her routine and sit next to her nightstand.  -The triglyceride portion of her cholesterol panel was also significantly high, she has restarted the fenofibrate just a few weeks ago and the fenofibrate will help focus on lowering the triglyceride portion of her cholesterol panel  She is following up in 3 months on her chronic conditions and has an appointment 03/07/2021 with this provider.  Please see if she is able to also schedule a fasting lab appointment earlier that week so that we can get an accurate cholesterol reading on her.  And we will discuss all of the results at her appointment.   Lastly, please ask her if she remember the name of her ophthalmologist at [facility] so we can call and get the results of her diabetic eye exam that she states she had completed the summer.   Thanks

## 2020-12-11 NOTE — Telephone Encounter (Signed)
That's ok. She can come in then.

## 2020-12-11 NOTE — Telephone Encounter (Signed)
Spoke with patient and advised of results/recommendations. She is unable to come in the week of her appointment for fasting labs due to work schedule. She is available that Monday or Tuesday of the week prior, 18th or 19th.  Please advise, thanks.

## 2020-12-11 NOTE — Telephone Encounter (Signed)
Left detailed message advising labs will be done the day of her appt on 4/28. Okay per [facility]

## 2021-02-06 ENCOUNTER — Other Ambulatory Visit: Payer: Self-pay

## 2021-02-06 ENCOUNTER — Encounter: Payer: Self-pay | Admitting: Obstetrics and Gynecology

## 2021-02-06 ENCOUNTER — Ambulatory Visit: Payer: BC Managed Care – PPO | Admitting: Obstetrics and Gynecology

## 2021-02-06 VITALS — BP 116/74 | HR 84 | Ht 63.0 in | Wt 157.0 lb

## 2021-02-06 DIAGNOSIS — Z01419 Encounter for gynecological examination (general) (routine) without abnormal findings: Secondary | ICD-10-CM

## 2021-02-06 MED ORDER — ETONOGESTREL-ETHINYL ESTRADIOL 0.12-0.015 MG/24HR VA RING: 1.0000 | VAGINAL_RING | VAGINAL | 3 refills | Status: DC

## 2021-02-06 NOTE — Patient Instructions (Signed)

## 2021-02-06 NOTE — Progress Notes (Signed)
35 y.o. G0P0000 Single Hispanic female here for annual exam.    Using NuvaRing for cramping.   A1C 9.1.   Received Covid booster.   PCP: Howard Pouch, DO  Patient's last menstrual period was 01/19/2021 (exact date).     Period Cycle (Days): 30 Period Duration (Days): 5 days Period Pattern: Regular Menstrual Flow:  (heavy first few days then tapers) Menstrual Control: Tampon Menstrual Control Change Freq (Hours): changes tampon every 3-4 hours on heaviest day Dysmenorrhea: None     Sexually active: No.  The current method of family planning is NuvaRing vaginal inserts.    Exercising: Yes.    cardio and weights Smoker:  no  Health Maintenance: Pap: 02-01-20 Neg:Neg HR HPV, 02-21-16 Neg:Neg HR HPV, 02-08-15 Neg History of abnormal Pap:  Yes,  abnormal pap 2011 with colposcopy but no treatment to cervix. MMG:  n/a Colonoscopy: 04/2008 for abdominal pain with Dr. Perry:normal BMD:   n/a  Result  n/a TDaP:  02-20-17 Gardasil:   Yes, completed HIV:02-20-17 NR Hep C: 02-20-17 Neg Screening Labs:  PCP.    reports that she has never smoked. She has never used smokeless tobacco. She reports current alcohol use of about 1.0 standard drink of alcohol per week. She reports that she does not use drugs.  Past Medical History:  Diagnosis Date  . Allergy   . Anxiety   . ASTHMA 11/19/2007  . CHICKENPOX 11/19/2007  . Diabetes mellitus without complication (HCC)    diet controlled  . Duodenitis   . Dysmenorrhea 11/19/2007  . DYSPHAGIA 11/19/2010  . ECZEMA 11/19/2007  . Esophageal stricture   . Esophagitis   . FATTY LIVER DISEASE 04/17/2008  . GERD 11/20/2010  . Hyperlipidemia   . IBS (irritable bowel syndrome)   . Infection of eyelid 08/07/2011  . Low vitamin D level 2018  . Other and unspecified noninfectious gastroenteritis and colitis(558.9) 02/2014  . RHINOSINUSITIS, CHRONIC 12/09/2007  . TB SKIN TEST, POSITIVE 11/19/2007    Past Surgical History:  Procedure Laterality Date  . CT SINUS LTD  W/O CM  09/23/07  . Mole excision     x's 4    Current Outpatient Medications  Medication Sig Dispense Refill  . blood glucose meter kit and supplies Dispense based on patient and insurance preference. Use up to four times daily as directed. DX E11.9 1 each 11  . EPINEPHrine 0.3 mg/0.3 mL IJ SOAJ injection Inject 0.3 mg into the muscle once.    . etonogestrel-ethinyl estradiol (NUVARING) 0.12-0.015 MG/24HR vaginal ring Place 1 each vaginally every 28 (twenty-eight) days. Insert vaginally and leave in place for 3 consecutive weeks, then remove for 1 week. 3 each 3  . fenofibrate (TRICOR) 145 MG tablet Take 1 tablet (145 mg total) by mouth daily. 90 tablet 3  . glipiZIDE (GLUCOTROL) 10 MG tablet Take 1 tablet (10 mg total) by mouth daily. 90 tablet 3  . hydrOXYzine (ATARAX/VISTARIL) 25 MG tablet TAKE 1 TABLET BY MOUTH EVERYDAY AT BEDTIME 90 tablet 1  . levocetirizine (XYZAL) 5 MG tablet TAKE 1 TABLET BY MOUTH EVERY DAY IN THE EVENING 90 tablet 0  . metFORMIN (GLUCOPHAGE) 1000 MG tablet Take 1 tablet (1,000 mg total) by mouth 2 (two) times daily with a meal. 180 tablet 1  . montelukast (SINGULAIR) 10 MG tablet every evening.    . rosuvastatin (CRESTOR) 20 MG tablet Take 1 tablet (20 mg total) by mouth daily. 90 tablet 3  . SYMBICORT 160-4.5 MCG/ACT inhaler Inhale 2 puffs into  the lungs 2 (two) times daily.  3  . tacrolimus (PROTOPIC) 0.1 % ointment Apply topically 2 (two) times daily. (Patient taking differently: Apply 1 application topically 2 (two) times daily as needed (for rash, skin issues).) 100 g 1  . triamcinolone cream (KENALOG) 0.1 % Apply 1 application topically 2 (two) times daily as needed. 453.6 g 2   No current facility-administered medications for this visit.    Family History  Adopted: Yes    Review of Systems  All other systems reviewed and are negative.   Exam:   BP 116/74   Pulse 84   Ht 5' 3" (1.6 m)   Wt 157 lb (71.2 kg)   LMP 01/19/2021 (Exact Date)   SpO2  98%   BMI 27.81 kg/m     General appearance: alert, cooperative and appears stated age Head: normocephalic, without obvious abnormality, atraumatic Neck: no adenopathy, supple, symmetrical, trachea midline and thyroid normal to inspection and palpation Lungs: clear to auscultation bilaterally Breasts: normal appearance, no masses or tenderness, No nipple retraction or dimpling, No nipple discharge or bleeding, Slight skin change of nipples consistent with prior cracking No axillary adenopathy Heart: regular rate and rhythm Abdomen: soft, non-tender; no masses, no organomegaly Extremities: extremities normal, atraumatic, no cyanosis or edema Skin: skin color, texture, turgor normal. No rashes or lesions Lymph nodes: cervical, supraclavicular, and axillary nodes normal. Neurologic: grossly normal  Pelvic: External genitalia:  no lesions              No abnormal inguinal nodes palpated.              Urethra:  normal appearing urethra with no masses, tenderness or lesions              Bartholins and Skenes: normal                 Vagina: normal appearing vagina with normal color and discharge, no lesions              Cervix: no lesions              Pap taken: No. Bimanual Exam:  Uterus:  normal size, contour, position, consistency, mobility, non-tender              Adnexa: no mass, fullness, tenderness              Chaperone was present for exam.  Assessment:   Well woman visit with normal exam. Dysmenorrhea. Hx LGSIL.  Cracked nipples.  Improved.  Eczema.  DM.  Not well controlled.  Plan: Mammogram screening discussed. Self breast awareness reviewed. Pap and HR HPV as above. Guidelines for Calcium, Vitamin D, regular exercise program including cardiovascular and weight bearing exercise. Refill of NuvaRing.  Patient considering referral to endocrinologist.  I encourage her to discuss with her PCP.  Follow up annually and prn.

## 2021-02-07 ENCOUNTER — Encounter: Payer: Self-pay | Admitting: Family Medicine

## 2021-02-07 DIAGNOSIS — E119 Type 2 diabetes mellitus without complications: Secondary | ICD-10-CM

## 2021-02-08 NOTE — Telephone Encounter (Signed)
She can keep her original chronic medical condition appointment at the end of April.  In the meantime stop the Crestor and see if her symptoms go away.  I am confused on her comment about insulin and endocrine referral.  We have been trying different oral medications to control her diabetes.  We have not discussed starting insulin, except in the conversation of if we can control her A1c with oral medications then we would have to consider insulin.    Endocrinology will start off the same way that we are by  trying different oral medications and if unable to get her A1c into controlled range with oral medications, then the next step would be insulin.  If she would like to have an endocrinology manage her diabetes, I can place that referral for her?  Or we can wait to the end of April and discuss that her chronic medical condition appointment

## 2021-02-08 NOTE — Telephone Encounter (Signed)
LVM for pt to CB regarding message sent on mychart and the need for clarification.

## 2021-02-08 NOTE — Telephone Encounter (Signed)
Patient called back.  She is scheduled for Monday 4/4 with Dr. Raoul Pitch.

## 2021-02-11 ENCOUNTER — Ambulatory Visit: Payer: BC Managed Care – PPO | Admitting: Family Medicine

## 2021-02-11 NOTE — Telephone Encounter (Signed)
Referral to endo placed.  

## 2021-02-11 NOTE — Addendum Note (Signed)
Addended by: Howard Pouch A on: 02/11/2021 07:46 AM   Modules accepted: Orders

## 2021-02-11 NOTE — Telephone Encounter (Signed)
Rescheduled appt on 4/28 per message below

## 2021-02-21 ENCOUNTER — Other Ambulatory Visit: Payer: Self-pay

## 2021-02-21 ENCOUNTER — Ambulatory Visit: Payer: BC Managed Care – PPO | Admitting: Endocrinology

## 2021-02-21 VITALS — BP 120/70 | HR 78 | Ht 63.0 in | Wt 153.0 lb

## 2021-02-21 DIAGNOSIS — E119 Type 2 diabetes mellitus without complications: Secondary | ICD-10-CM

## 2021-02-21 LAB — POCT GLYCOSYLATED HEMOGLOBIN (HGB A1C): Hemoglobin A1C: 7.6 % — AB (ref 4.0–5.6)

## 2021-02-21 MED ORDER — CONTOUR NEXT TEST VI STRP: 1.0000 | ORAL_STRIP | Freq: Every day | 3 refills | Status: DC

## 2021-02-21 MED ORDER — CONTOUR NEXT ONE DEVI: 1.0000 | Freq: Once | 0 refills | Status: AC

## 2021-02-21 MED ORDER — DAPAGLIFLOZIN PROPANEDIOL 5 MG PO TABS: 5.0000 mg | ORAL_TABLET | Freq: Every day | ORAL | 3 refills | Status: DC

## 2021-02-21 MED ORDER — METFORMIN HCL ER 500 MG PO TB24: 2000.0000 mg | ORAL_TABLET | Freq: Every day | ORAL | 3 refills | Status: DC

## 2021-02-21 MED ORDER — GLIPIZIDE 5 MG PO TABS: 5.0000 mg | ORAL_TABLET | Freq: Every day | ORAL | 3 refills | Status: DC

## 2021-02-21 NOTE — Patient Instructions (Addendum)
good diet and exercise significantly improve the control of your diabetes.  please let me know if you wish to be referred to a dietician.  high blood sugar is very risky to your health.  you should see an eye doctor and dentist every year.  It is very important to get all recommended vaccinations.  Controlling your blood pressure and cholesterol drastically reduces the damage diabetes does to your body.  Those who smoke should quit.  Please discuss these with your doctor.  check your blood sugar once a day.  vary the time of day when you check, between before the 3 meals, and at bedtime.  also check if you have symptoms of your blood sugar being too high or too low.  please keep a record of the readings and bring it to your next appointment here (or you can bring the meter itself).  You can write it on any piece of paper.  please call us sooner if your blood sugar goes below 70, or if most of your readings are over 200.  We will need to take this complex situation in stages.   For now, please reduce the glipizide to, and: I have sent a prescription to your pharmacy, to add "[place]," and: Please change the metformin to extended-release (I hope that this change will help your stomach tolerate other meds in the future). Please come back for a follow-up appointment in 3 months.

## 2021-02-21 NOTE — Progress Notes (Addendum)
Subjective:    Patient ID: Brenda Rosales, female    DOB: 21-Dec-1985, 35 y.o.   MRN: [id number]  HPI pt is referred by Dr Raoul Pitch, for diabetes.  Pt states DM was dx'ed in 2012; she is unaware of any chronic complications; she has never been on insulin; pt says her diet and exercise are good; she has never had GDM, pancreatitis, pancreatic surgery, severe hypoglycemia or DKA.  She has lost a few lbs due to her efforts.  She did not tolerate Januvia (N/D).  She takes 2 oral meds.  She does not check cbg's.   Past Medical History:  Diagnosis Date  . Allergy   . Anxiety   . ASTHMA 11/19/2007  . CHICKENPOX 11/19/2007  . Diabetes mellitus without complication (HCC)    diet controlled  . Duodenitis   . Dysmenorrhea 11/19/2007  . DYSPHAGIA 11/19/2010  . ECZEMA 11/19/2007  . Esophageal stricture   . Esophagitis   . FATTY LIVER DISEASE 04/17/2008  . GERD 11/20/2010  . Hyperlipidemia   . IBS (irritable bowel syndrome)   . Infection of eyelid 08/07/2011  . Low vitamin D level 2018  . Other and unspecified noninfectious gastroenteritis and colitis(558.9) 02/2014  . RHINOSINUSITIS, CHRONIC 12/09/2007  . TB SKIN TEST, POSITIVE 11/19/2007    Past Surgical History:  Procedure Laterality Date  . CT SINUS LTD W/O CM  09/23/07  . Mole excision     x's 4    Social History   Socioeconomic History  . Marital status: Single    Spouse name: Not on file  . Number of children: 0  . Years of education: Not on file  . Highest education level: Not on file  Occupational History  . Occupation: Product manager: Psychologist, sport and exercise [employer]  Tobacco Use  . Smoking status: Never Smoker  . Smokeless tobacco: Never Used  Vaping Use  . Vaping Use: Never used  Substance and Sexual Activity  . Alcohol use: Yes    Alcohol/week: 1.0 standard drink    Types: 1 Standard drinks or equivalent per week    Comment: SOCIALLY  . Drug use: No  . Sexual activity: Not Currently    Partners: Male    Birth control/protection:  Inserts    Comment: Nuvaring  Other Topics Concern  . Not on file  Social History Narrative   Adopted from [place].    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   Drinks caffeine, uses herbal remedies   Wears her seatbelt, wears bicycle helmet   Reports routine exercise    Smoke detector in the home.   Feels safe in relationships.     Social Determinants of Health   Financial Resource Strain: Not on file  Food Insecurity: Not on file  Transportation Needs: Not on file  Physical Activity: Not on file  Stress: Not on file  Social Connections: Not on file  Intimate Partner Violence: Not on file    Current Outpatient Medications on File Prior to Visit  Medication Sig Dispense Refill  . blood glucose meter kit and supplies Dispense based on patient and insurance preference. Use up to four times daily as directed. DX E11.9 1 each 11  . EPINEPHrine 0.3 mg/0.3 mL IJ SOAJ injection Inject 0.3 mg into the muscle once.    . etonogestrel-ethinyl estradiol (NUVARING) 0.12-0.015 MG/24HR vaginal ring Place 1 each vaginally every 28 (twenty-eight) days. Insert vaginally and leave in place for 3 consecutive  weeks, then remove for 1 week. 3 each 3  . fenofibrate (TRICOR) 145 MG tablet Take 1 tablet (145 mg total) by mouth daily. 90 tablet 3  . hydrOXYzine (ATARAX/VISTARIL) 25 MG tablet TAKE 1 TABLET BY MOUTH EVERYDAY AT BEDTIME 90 tablet 1  . levocetirizine (XYZAL) 5 MG tablet TAKE 1 TABLET BY MOUTH EVERY DAY IN THE EVENING 90 tablet 0  . montelukast (SINGULAIR) 10 MG tablet every evening.    . SYMBICORT 160-4.5 MCG/ACT inhaler Inhale 2 puffs into the lungs 2 (two) times daily.  3  . tacrolimus (PROTOPIC) 0.1 % ointment Apply topically 2 (two) times daily. (Patient taking differently: Apply 1 application topically 2 (two) times daily as needed (for rash, skin issues).) 100 g 1  . triamcinolone cream (KENALOG) 0.1 % Apply 1 application topically 2 (two) times  daily as needed. 453.6 g 2   No current facility-administered medications on file prior to visit.    Allergies  Allergen Reactions  . Vancomycin Other (See Comments)    redmans syndrome   . Januvia [Sitagliptin] Other (See Comments)    Dizziness    Family History  Adopted: Yes    BP 120/70 (BP Location: Right Arm, Patient Position: Sitting, Cuff Size: Normal)   Pulse 78   Ht _0  (1.6 m)   Wt 153 lb (69.4 kg)   SpO2 99%   BMI 27.10 kg/m   Review of Systems denies sob, n/v, and depression.      Objective:   Physical Exam VITAL SIGNS:  See vs page GENERAL: no distress Pulses: dorsalis pedis intact bilat.   MSK: no deformity of the feet CV: no leg edema Skin:  no ulcer on the feet.  normal color and temp on the feet.  Neuro: sensation is intact to touch on the feet.    Lab Results  Component Value Date   HGBA1C 7.6 (A) 02/21/2021   Lab Results  Component Value Date   CREATININE 0.69 12/10/2020   BUN 10 12/10/2020   NA 136 12/10/2020   K 4.1 12/10/2020   CL 103 12/10/2020   CO2 19 (L) 12/10/2020   I have reviewed outside records, and summarized: Pt was noted to have elevated A1c, and referred here.  GYN wellness was also addressed.       Assessment & Plan:  Type 2 DM: uncontrolled.  Nausea, prob due to several meds.   Patient Instructions  good diet and exercise significantly improve the control of your diabetes.  please let me know if you wish to be referred to a dietician.  high blood sugar is very risky to your health.  you should see an eye doctor and dentist every year.  It is very important to get all recommended vaccinations.  Controlling your blood pressure and cholesterol drastically reduces the damage diabetes does to your body.  Those who smoke should quit.  Please discuss these with your doctor.  check your blood sugar once a day.  vary the time of day when you check, between before the 3 meals, and at bedtime.  also check if you have symptoms  of your blood sugar being too high or too low.  please keep a record of the readings and bring it to your next appointment here (or you can bring the meter itself).  You can write it on any piece of paper.  please call us sooner if your blood sugar goes below 70, or if most of your readings are over 200.  We  will need to take this complex situation in stages.   For now, please reduce the glipizide to, and: I have sent a prescription to your pharmacy, to add "[place]," and: Please change the metformin to extended-release (I hope that this change will help your stomach tolerate other meds in the future). Please come back for a follow-up appointment in 3 months.

## 2021-03-07 ENCOUNTER — Encounter: Payer: Self-pay | Admitting: Family Medicine

## 2021-03-07 ENCOUNTER — Ambulatory Visit: Payer: BC Managed Care – PPO | Admitting: Family Medicine

## 2021-03-07 ENCOUNTER — Other Ambulatory Visit: Payer: Self-pay

## 2021-03-07 VITALS — BP 109/72 | HR 92 | Temp 98.3°F | Ht 63.0 in | Wt 152.0 lb

## 2021-03-07 DIAGNOSIS — E663 Overweight: Secondary | ICD-10-CM

## 2021-03-07 DIAGNOSIS — E781 Pure hyperglyceridemia: Secondary | ICD-10-CM

## 2021-03-07 DIAGNOSIS — E119 Type 2 diabetes mellitus without complications: Secondary | ICD-10-CM | POA: Diagnosis not present

## 2021-03-07 LAB — COMPREHENSIVE METABOLIC PANEL
ALT: 22 U/L (ref 0–35)
AST: 17 U/L (ref 0–37)
Albumin: 4.3 g/dL (ref 3.5–5.2)
Alkaline Phosphatase: 62 U/L (ref 39–117)
BUN: 13 mg/dL (ref 6–23)
CO2: 24 mEq/L (ref 19–32)
Calcium: 9 mg/dL (ref 8.4–10.5)
Chloride: 104 mEq/L (ref 96–112)
Creatinine, Ser: 0.64 mg/dL (ref 0.40–1.20)
GFR: 114.67 mL/min (ref >60.00)
Glucose, Bld: 146 mg/dL — ABNORMAL HIGH (ref 70–99)
Potassium: 4.4 mEq/L (ref 3.5–5.1)
Sodium: 136 mEq/L (ref 135–145)
Total Bilirubin: 0.4 mg/dL (ref 0.2–1.2)
Total Protein: 7 g/dL (ref 6.0–8.3)

## 2021-03-07 LAB — LIPID PANEL
Cholesterol: 152 mg/dL (ref 0–200)
HDL: 39.5 mg/dL (ref >39.00)
LDL Cholesterol: 77 mg/dL (ref 0–99)
NonHDL: 112.17
Total CHOL/HDL Ratio: 4
Triglycerides: 174 mg/dL — ABNORMAL HIGH (ref 0.0–149.0)
VLDL: 34.8 mg/dL (ref 0.0–40.0)

## 2021-03-07 NOTE — Progress Notes (Signed)
Patient ID: Brenda Rosales, female   DOB: Aug 06, 1986, 35 y.o.   MRN: [id number]    Patient Care Team    Relationship Specialty Notifications Start End  Brenda Hillock, DO PCP - General Family Medicine  04/23/16    Comment: Patient request  Brenda Cobbs, MD Consulting Physician Obstetrics and Gynecology  04/23/16   Brenda Lever, MD Consulting Physician Pulmonary Disease  04/23/16   Brenda Shipper, MD Consulting Physician Gastroenterology  04/23/16   Brenda Napoleon, PA-C (Inactive)  Dermatology  05/30/20     Chief Complaint  Patient presents with  . Follow-up    [facility]; pt is fasting  . Hyperlipidemia  . Diabetes    Subjective: Brenda Rosales is a 35 y.o. female present for follow up on diabetes.  Diabetes:  Pt now est with Dr. Loanne Drilling for diabetic management.  Prescribed  metformin 1000 mg BID, Farxiga 5 mg and Glipizide 5 mg QD. Patient denies dizziness, hyperglycemic or hypoglycemic events. Patient denies numbness, tingling in the extremities or nonhealing wounds of feet.  Januvia  Caused dizziness.   HLD/overweight: Pt reports she is taking her tricor daily now. She is back to exercising and eating healthier options. She has lost 8 lbs this year  Recent Results (from the past 2160 hour(s))  POCT HgB A1C     Status: Abnormal   Collection Time: 12/10/20  3:13 PM  Result Value Ref Range   Hemoglobin A1C 9.1 (A) 4.0 - 5.6 %   HbA1c POC (<> result, manual entry) 9.1 4.0 - 5.6 %   HbA1c, POC (prediabetic range) 9.1 (A) 5.7 - 6.4 %   HbA1c, POC (controlled diabetic range) 9.1 (A) 0.0 - 7.0 %  Microalbumin / creatinine urine ratio     Status: None   Collection Time: 12/10/20  3:28 PM  Result Value Ref Range   Creatinine, Urine 67 20 - 275 mg/dL   Microalb, Ur 0.4 mg/dL    Comment: Reference Range Not established    Microalb Creat Ratio 6 <30 mcg/mg creat    Comment: . The ADA defines abnormalities in albumin excretion as follows: Marland Kitchen Albuminuria  Category        Result (mcg/mg creatinine) . Normal to Mildly increased   <30 Moderately increased         30-299  Severely increased           > OR = 300 . The ADA recommends that at least two of three specimens collected within a 3-6 month period be abnormal before considering a patient to be within a diagnostic category.   Basic Metabolic Panel (BMET)     Status: Abnormal   Collection Time: 12/10/20  3:28 PM  Result Value Ref Range   Glucose, Bld 145 (H) 65 - 99 mg/dL    Comment: .            Fasting reference interval . For someone without known diabetes, a glucose value >125 mg/dL indicates that they may have diabetes and this should be confirmed with a follow-up test. .    BUN 10 7 - 25 mg/dL   Creat 0.69 0.50 - 1.10 mg/dL   BUN/Creatinine Ratio NOT APPLICABLE 6 - 22 (calc)   Sodium 136 135 - 146 mmol/L   Potassium 4.1 3.5 - 5.3 mmol/L   Chloride 103 98 - 110 mmol/L   CO2 19 (L) 20 - 32 mmol/L   Calcium 9.8 8.6 - 10.2 mg/dL  TSH  Status: None   Collection Time: 12/10/20  3:28 PM  Result Value Ref Range   TSH 3.03 mIU/L    Comment:           Reference Range .           > or = 20 Years  0.40-4.50 .                Pregnancy Ranges           First trimester    0.26-2.66           Second trimester   0.55-2.73           Third trimester    0.43-2.91   Lipid panel     Status: Abnormal   Collection Time: 12/10/20  3:28 PM  Result Value Ref Range   Cholesterol 263 (H) <200 mg/dL   HDL 50 > OR = 50 mg/dL   Triglycerides 279 (H) <150 mg/dL    Comment: . If a non-fasting specimen was collected, consider repeat triglyceride testing on a fasting specimen if clinically indicated.  Yates Decamp et al. J. of Clin. Lipidol. 2353;6:144-315. Marland Kitchen    LDL Cholesterol (Calc) 167 (H) mg/dL (calc)    Comment: Reference range: <100 . Desirable range <100 mg/dL for primary prevention;   <70 mg/dL for patients with CHD or diabetic patients  with > or = 2 CHD risk  factors. Marland Kitchen LDL-C is now calculated using the Martin-Hopkins  calculation, which is a validated novel method providing  better accuracy than the Friedewald equation in the  estimation of LDL-C.  Cresenciano Genre et al. Annamaria Helling. 4008;676(19): 2061-2068  (http://education.[facility].com/faq/FAQ164)    Total CHOL/HDL Ratio 5.3 (H) <5.0 (calc)   Non-HDL Cholesterol (Calc) 213 (H) <130 mg/dL (calc)    Comment: For patients with diabetes plus 1 major ASCVD risk  factor, treating to a non-HDL-C goal of <100 mg/dL  (LDL-C of <70 mg/dL) is considered a therapeutic  option.   POCT glycosylated hemoglobin (Hb A1C)     Status: Abnormal   Collection Time: 02/21/21  3:31 PM  Result Value Ref Range   Hemoglobin A1C 7.6 (A) 4.0 - 5.6 %   HbA1c POC (<> result, manual entry)     HbA1c, POC (prediabetic range)     HbA1c, POC (controlled diabetic range)      Immunization History  Administered Date(s) Administered  . Influenza Inj Mdck Quad With Preservative 08/19/2018  . Influenza Whole 08/19/2009  . Influenza,inj,Quad PF,6+ Mos 07/31/2016, 08/02/2019  . Influenza-Unspecified 08/11/2015, 07/12/2017, 07/11/2020  . Moderna Sars-Covid-2 Vaccination 01/07/2020, 02/04/2020, 09/19/2020  . Pneumococcal Conjugate-13 11/06/2016  . Pneumococcal Polysaccharide-23 07/23/2007, 06/09/2018  . Td 09/11/2004  . Tdap 07/04/2012    Past Medical History:  Diagnosis Date  . Allergy   . Anxiety   . ASTHMA 11/19/2007  . CHICKENPOX 11/19/2007  . Diabetes mellitus without complication (HCC)    diet controlled  . Duodenitis   . Dysmenorrhea 11/19/2007  . DYSPHAGIA 11/19/2010  . ECZEMA 11/19/2007  . Esophageal stricture   . Esophagitis   . FATTY LIVER DISEASE 04/17/2008  . GERD 11/20/2010  . Hyperlipidemia   . IBS (irritable bowel syndrome)   . Infection of eyelid 08/07/2011  . Low vitamin D level 2018  . Other and unspecified noninfectious gastroenteritis and colitis(558.9) 02/2014  . RHINOSINUSITIS, CHRONIC 12/09/2007  .  TB SKIN TEST, POSITIVE 11/19/2007   Allergies  Allergen Reactions  . Vancomycin Other (See Comments)    redmans syndrome   .  Januvia [Sitagliptin] Other (See Comments)    Dizziness   Past Surgical History:  Procedure Laterality Date  . CT SINUS LTD W/O CM  09/23/07  . Mole excision     x's 4   Family History  Adopted: Yes   Social History   Socioeconomic History  . Marital status: Single    Spouse name: Not on file  . Number of children: 0  . Years of education: Not on file  . Highest education level: Not on file  Occupational History  . Occupation: Product manager: Psychologist, sport and exercise [employer]  Tobacco Use  . Smoking status: Never Smoker  . Smokeless tobacco: Never Used  Vaping Use  . Vaping Use: Never used  Substance and Sexual Activity  . Alcohol use: Yes    Alcohol/week: 1.0 standard drink    Types: 1 Standard drinks or equivalent per week    Comment: SOCIALLY  . Drug use: No  . Sexual activity: Not Currently    Partners: Male    Birth control/protection: Inserts    Comment: Nuvaring  Other Topics Concern  . Not on file  Social History Narrative   Adopted from [place].    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   Drinks caffeine, uses herbal remedies   Wears her seatbelt, wears bicycle helmet   Reports routine exercise    Smoke detector in the home.   Feels safe in relationships.     Social Determinants of Health   Financial Resource Strain: Not on file  Food Insecurity: Not on file  Transportation Needs: Not on file  Physical Activity: Not on file  Stress: Not on file  Social Connections: Not on file  Intimate Partner Violence: Not on file     ROS: Negative, with the exception of above mentioned in HPI  Objective: BP 109/72   Pulse 92   Temp 98.3 F (36.8 C) (Oral)   Ht '5\' 3"'  (1.6 m)   Wt 152 lb (68.9 kg)   SpO2 98%   BMI 26.93 kg/m  Gen: Afebrile. No acute distress.nontoxic, very pleasant, mildly  overweight female.  HENT: AT. [place].  Eyes:Pupils Equal Round Reactive to light, Extraocular movements intact,  Conjunctiva without redness, discharge or icterus. CV: RRR  Chest: CTAB, no wheeze or crackles.  Neuro: Normal gait. PERLA. EOMi. Alert. Oriented x3 Psych: Normal affect, dress and demeanor. Normal speech. Normal thought content and judgment.   No results found for this or any previous visit (from the past 24 hour(s)).  Assessment/plan: Sydney Hasten is a 35 y.o. female present for  Diabetes mellitus without complication (HCC)/overweight - reviewed recent labs collected at Endocrine.  -  Prescribed: Metformin, Farxiga and glip through endo -Tried medications: Januvia >> upset her stomach  A1c: 6.0 --> 6.6--> 7.4 --> 6.7--> 7.3--> 8.0--> 8.0 >> 8.9 >>> 9.1>>>6.5 (lost to follow up)> 9.1 today > 7.6 at endo  Urine Microalbumin w/creat.: UTD - diabetic diet and routine exercise encouraged.  - eye exam: [facility] on [address]. 06/2020 - foot exam completed 12/10/2020 - flu shot UTD 2021 - PPSV 23 completed 06/09/18, prevnar 10/2016  Kathlen Brunswick continue to follow w/ endo  Hyperlipidemia/overweight:  - She is back to exercising and has lost weight.  - encouraged routine exercise and dietary modifications.  - educated pt on LDL goals in a diabetic.  - continue tricor 145 mg qd.  If LDL above goal still, will need to add statin. She  understands the need for statin, ldl goal and statin is not to be used if pregnancy occurs.      Return in about 7 months (around 10/21/2021) for CPE (30 min), [facility] (30 min).  No orders of the defined types were placed in this encounter.  Orders Placed This Encounter  Procedures  . Lipid panel  . Comp Met (CMET)     Electronically signed by: Howard Pouch, [place]

## 2021-03-07 NOTE — Patient Instructions (Addendum)
Great to see you today.  I have refilled the medication(s) we provide.   If labs were collected, we will inform you of lab results once received either by echart message or telephone call.   - echart message- for normal results that have been seen by the patient already.   - telephone call: abnormal results or if patient has not viewed results in their echart.  Next appt schedule as your physical in Early December- come fasting that day as well.     High Cholesterol  High cholesterol is a condition in which the blood has high levels of a white, waxy substance similar to fat (cholesterol). The liver makes all the cholesterol that the body needs. The human body needs small amounts of cholesterol to help build cells. A person gets extra or excess cholesterol from the food that he or she eats. The blood carries cholesterol from the liver to the rest of the body. If you have high cholesterol, deposits (plaques) may build up on the walls of your arteries. Arteries are the blood vessels that carry blood away from your heart. These plaques make the arteries narrow and stiff. Cholesterol plaques increase your risk for heart attack and stroke. Work with your health care provider to keep your cholesterol levels in a healthy range. What increases the risk? The following factors may make you more likely to develop this condition:  Eating foods that are high in animal fat (saturated fat) or cholesterol.  Being overweight.  Not getting enough exercise.  A family history of high cholesterol (familial hypercholesterolemia).  Use of tobacco products.  Having diabetes. What are the signs or symptoms? There are no symptoms of this condition. How is this diagnosed? This condition may be diagnosed based on the results of a blood test.  If you are older than 35 years of age, your health care provider may check your cholesterol levels every 4-6 years.  You may be checked more often if you have high  cholesterol or other risk factors for heart disease. The blood test for cholesterol measures:  "Bad" cholesterol, or LDL cholesterol. This is the main type of cholesterol that causes heart disease. The desired level is less than 100 mg/dL.  "Good" cholesterol, or HDL cholesterol. HDL helps protect against heart disease by cleaning the arteries and carrying the LDL to the liver for processing. The desired level for HDL is 60 mg/dL or higher.  Triglycerides. These are fats that your body can store or burn for energy. The desired level is less than 150 mg/dL.  Total cholesterol. This measures the total amount of cholesterol in your blood and includes LDL, HDL, and triglycerides. The desired level is less than 200 mg/dL. How is this treated? This condition may be treated with:  Diet changes. You may be asked to eat foods that have more fiber and less saturated fats or added sugar.  Lifestyle changes. These may include regular exercise, maintaining a healthy weight, and quitting use of tobacco products.  Medicines. These are given when diet and lifestyle changes have not worked. You may be prescribed a statin medicine to help lower your cholesterol levels. Follow these instructions at home: Eating and drinking  Eat a healthy, balanced diet. This diet includes: ? Daily servings of a variety of fresh, frozen, or canned fruits and vegetables. ? Daily servings of whole grain foods that are rich in fiber. ? Foods that are low in saturated fats and trans fats. These include poultry and fish without skin,  lean cuts of meat, and low-fat dairy products. ? A variety of fish, especially oily fish that contain omega-3 fatty acids. Aim to eat fish at least 2 times a week.  Avoid foods and drinks that have added sugar.  Use healthy cooking methods, such as roasting, grilling, broiling, baking, poaching, steaming, and stir-frying. Do not fry your food except for stir-frying.   Lifestyle  Get regular  exercise. Aim to exercise for a total of 150 minutes a week. Increase your activity level by doing activities such as gardening, walking, and taking the stairs.  Do not use any products that contain nicotine or tobacco, such as cigarettes, e-cigarettes, and chewing tobacco. If you need help quitting, ask your health care provider.   General instructions  Take over-the-counter and prescription medicines only as told by your health care provider.  Keep all follow-up visits as told by your health care provider. This is important. Where to find more information  American Heart Association: www.heart.org  National Heart, Lung, and Blood Institute: https://wilson-eaton.com/ Contact a health care provider if:  You have trouble achieving or maintaining a healthy diet or weight.  You are starting an exercise program.  You are unable to stop smoking. Get help right away if:  You have chest pain.  You have trouble breathing.  You have any symptoms of a stroke. "BE FAST" is an easy way to remember the main warning signs of a stroke: ? B - Balance. Signs are dizziness, sudden trouble walking, or loss of balance. ? E - Eyes. Signs are trouble seeing or a sudden change in vision. ? F - Face. Signs are sudden weakness or numbness of the face, or the face or eyelid drooping on one side. ? A - Arms. Signs are weakness or numbness in an arm. This happens suddenly and usually on one side of the body. ? S - Speech. Signs are sudden trouble speaking, slurred speech, or trouble understanding what people say. ? T - Time. Time to call emergency services. Write down what time symptoms started.  You have other signs of a stroke, such as: ? A sudden, severe headache with no known cause. ? Nausea or vomiting. ? Seizure. These symptoms may represent a serious problem that is an emergency. Do not wait to see if the symptoms will go away. Get medical help right away. Call your local emergency services (911 in the  [place] Do not drive yourself to the hospital. Summary  Cholesterol plaques increase your risk for heart attack and stroke. Work with your health care provider to keep your cholesterol levels in a healthy range.  Eat a healthy, balanced diet, get regular exercise, and maintain a healthy weight.  Do not use any products that contain nicotine or tobacco, such as cigarettes, e-cigarettes, and chewing tobacco.  Get help right away if you have any symptoms of a stroke. This information is not intended to replace advice given to you by your health care provider. Make sure you discuss any questions you have with your health care provider. Document Revised: 09/26/2019 Document Reviewed: 09/26/2019 Elsevier Patient Education  2021 [employer].

## 2021-03-08 ENCOUNTER — Telehealth: Payer: Self-pay | Admitting: Family Medicine

## 2021-03-08 NOTE — Telephone Encounter (Signed)
Please inform patient the following information: Her cholesterol panel was much improved with ldl 77 and triglycerides 174.  Please ask her if she is still taking the crestor and fenfibrate?  She had called in a couple weeks ago and was having symptoms she felt may be a side affect of crestor and we told her to stop crestor and see if symptoms resolved, then restart to see if it was indeed coming from the crestor.   It appears the statin/fenofibrate combo worked great for her cholesterol levels, so I would recommend she be on both if she is able to tolerate. If she did not restart the crestor, we can try a different statin- some ppl have side effects to one statin, but necessarily all statin.   Please advise on her response so I may refill the crestor or call in a different statin for her.  Thanks.

## 2021-03-08 NOTE — Telephone Encounter (Signed)
LM for pt to return call to discuss.  

## 2021-03-11 MED ORDER — ATORVASTATIN CALCIUM 10 MG PO TABS: 10.0000 mg | ORAL_TABLET | Freq: Every day | ORAL | 3 refills | Status: DC

## 2021-03-11 NOTE — Telephone Encounter (Signed)
Pt has not been taking crestor but has agreed to try a different stain Rx (30 d/s). I advised pt to call to let let us know how she tolerates new Rx in about 2 weeks.

## 2021-03-11 NOTE — Addendum Note (Signed)
Addended by: Howard Pouch A on: 03/11/2021 10:33 AM   Modules accepted: Orders

## 2021-03-11 NOTE — Telephone Encounter (Signed)
LVM for pt to CB regarding results.  

## 2021-03-11 NOTE — Telephone Encounter (Signed)
Atorvastatin prescribed

## 2021-04-04 ENCOUNTER — Telehealth (INDEPENDENT_AMBULATORY_CARE_PROVIDER_SITE_OTHER): Payer: BC Managed Care – PPO | Admitting: Family Medicine

## 2021-04-04 ENCOUNTER — Encounter: Payer: Self-pay | Admitting: Family Medicine

## 2021-04-04 DIAGNOSIS — J4541 Moderate persistent asthma with (acute) exacerbation: Secondary | ICD-10-CM | POA: Diagnosis not present

## 2021-04-04 MED ORDER — BENZONATATE 200 MG PO CAPS: 200.0000 mg | ORAL_CAPSULE | Freq: Two times a day (BID) | ORAL | 0 refills | Status: DC | PRN

## 2021-04-04 MED ORDER — ALBUTEROL SULFATE HFA 108 (90 BASE) MCG/ACT IN AERS: 2.0000 | INHALATION_SPRAY | Freq: Four times a day (QID) | RESPIRATORY_TRACT | 11 refills | Status: AC | PRN

## 2021-04-04 MED ORDER — AMOXICILLIN-POT CLAVULANATE 875-125 MG PO TABS: 1.0000 | ORAL_TABLET | Freq: Two times a day (BID) | ORAL | 0 refills | Status: DC

## 2021-04-04 NOTE — Patient Instructions (Signed)
Sinusitis, Adult Sinusitis is soreness and swelling (inflammation) of your sinuses. Sinuses are hollow spaces in the bones around your face. They are located:  Around your eyes.  In the middle of your forehead.  Behind your nose.  In your cheekbones. Your sinuses and nasal passages are lined with a fluid called mucus. Mucus drains out of your sinuses. Swelling can trap mucus in your sinuses. This lets germs (bacteria, virus, or fungus) grow, which leads to infection. Most of the time, this condition is caused by a virus. What are the causes? This condition is caused by:  Allergies.  Asthma.  Germs.  Things that block your nose or sinuses.  Growths in the nose (nasal polyps).  Chemicals or irritants in the air.  Fungus (rare). What increases the risk? You are more likely to develop this condition if:  You have a weak body defense system (immune system).  You do a lot of swimming or diving.  You use nasal sprays too much.  You smoke. What are the signs or symptoms? The main symptoms of this condition are pain and a feeling of pressure around the sinuses. Other symptoms include:  Stuffy nose (congestion).  Runny nose (drainage).  Swelling and warmth in the sinuses.  Headache.  Toothache.  A cough that may get worse at night.  Mucus that collects in the throat or the back of the nose (postnasal drip).  Being unable to smell and taste.  Being very tired (fatigue).  A fever.  Sore throat.  Bad breath. How is this diagnosed? This condition is diagnosed based on:  Your symptoms.  Your medical history.  A physical exam.  Tests to find out if your condition is short-term (acute) or long-term (chronic). Your doctor may: ? Check your nose for growths (polyps). ? Check your sinuses using a tool that has a light (endoscope). ? Check for allergies or germs. ? Do imaging tests, such as an MRI or CT scan. How is this treated? Treatment for this  condition depends on the cause and whether it is short-term or long-term.  If caused by a virus, your symptoms should go away on their own within 10 days. You may be given medicines to relieve symptoms. They include: ? Medicines that shrink swollen tissue in the nose. ? Medicines that treat allergies (antihistamines). ? A spray that treats swelling of the nostrils. ? Rinses that help get rid of thick mucus in your nose (nasal saline washes).  If caused by bacteria, your doctor may wait to see if you will get better without treatment. You may be given antibiotic medicine if you have: ? A very bad infection. ? A weak body defense system.  If caused by growths in the nose, you may need to have surgery. Follow these instructions at home: Medicines  Take, use, or apply over-the-counter and prescription medicines only as told by your doctor. These may include nasal sprays.  If you were prescribed an antibiotic medicine, take it as told by your doctor. Do not stop taking the antibiotic even if you start to feel better. Hydrate and humidify  Drink enough water to keep your pee (urine) pale yellow.  Use a cool mist humidifier to keep the humidity level in your home above 50%.  Breathe in steam for 10-15 minutes, 3-4 times a day, or as told by your doctor. You can do this in the bathroom while a hot shower is running.  Try not to spend time in cool or dry air.  Rest  Rest as much as you can.  Sleep with your head raised (elevated).  Make sure you get enough sleep each night. General instructions  Put a warm, moist washcloth on your face 3-4 times a day, or as often as told by your doctor. This will help with discomfort.  Wash your hands often with soap and water. If there is no soap and water, use hand sanitizer.  Do not smoke. Avoid being around people who are smoking (secondhand smoke).  Keep all follow-up visits as told by your doctor. This is important.   Contact a doctor  if:  You have a fever.  Your symptoms get worse.  Your symptoms do not get better within 10 days. Get help right away if:  You have a very bad headache.  You cannot stop throwing up (vomiting).  You have very bad pain or swelling around your face or eyes.  You have trouble seeing.  You feel confused.  Your neck is stiff.  You have trouble breathing. Summary  Sinusitis is swelling of your sinuses. Sinuses are hollow spaces in the bones around your face.  This condition is caused by tissues in your nose that become inflamed or swollen. This traps germs. These can lead to infection.  If you were prescribed an antibiotic medicine, take it as told by your doctor. Do not stop taking it even if you start to feel better.  Keep all follow-up visits as told by your doctor. This is important. This information is not intended to replace advice given to you by your health care provider. Make sure you discuss any questions you have with your health care provider. Document Revised: 03/29/2018 Document Reviewed: 03/29/2018 Elsevier Patient Education  2021 [place].  Acute Bronchitis, Adult  Acute bronchitis is when air tubes in the lungs (bronchi) suddenly get swollen. The condition can make it hard for you to breathe. In adults, acute bronchitis usually goes away within 2 weeks. A cough caused by bronchitis may last up to 3 weeks. Smoking, allergies, and asthma can make the condition worse. What are the causes? This condition is caused by:  Cold and flu viruses. The most common cause of this condition is the virus that causes the common cold.  Bacteria.  Substances that irritate the lungs, including: ? Smoke from cigarettes and other types of tobacco. ? Dust and pollen. ? Fumes from chemicals, gases, or burned fuel. ? Other materials that pollute indoor or outdoor air.  Close contact with someone who has acute bronchitis. What increases the risk? The following factors may  make you more likely to develop this condition:  A weak body's defense system. This is also called the immune system.  Any condition that affects your lungs and breathing, such as asthma. What are the signs or symptoms? Symptoms of this condition include:  A cough.  Coughing up clear, yellow, or green mucus.  Wheezing.  Chest congestion.  Shortness of breath.  A fever.  Body aches.  Chills.  A sore throat. How is this treated? Acute bronchitis may go away over time without treatment. Your doctor may recommend:  Drinking more fluids.  Taking a medicine for a fever or cough.  Using a device that gets medicine into your lungs (inhaler).  Using a vaporizer or a humidifier. These are machines that add water or moisture in the air to help with coughing and poor breathing. Follow these instructions at home: Activity  Get a lot of rest.  Avoid places where  there are fumes from chemicals.  Return to your normal activities as told by your doctor. Ask your doctor what activities are safe for you. Lifestyle  Drink enough fluids to keep your pee (urine) pale yellow.  Do not drink alcohol.  Do not use any products that contain nicotine or tobacco, such as cigarettes, e-cigarettes, and chewing tobacco. If you need help quitting, ask your doctor. Be aware that: ? Your bronchitis will get worse if you smoke or breathe in other people's smoke (secondhand smoke). ? Your lungs will heal faster if you quit smoking. General instructions  Take over-the-counter and prescription medicines only as told by your doctor.  Use an inhaler, cool mist vaporizer, or humidifier as told by your doctor.  Rinse your mouth often with salt water. To make salt water, dissolve -1 tsp (3-6 g) of salt in 1 cup (237 mL) of warm water.  Keep all follow-up visits as told by your doctor. This is important.   How is this prevented? To lower your risk of getting this condition again:  Wash your hands  often with soap and water. If soap and water are not available, use hand sanitizer.  Avoid contact with people who have cold symptoms.  Try not to touch your mouth, nose, or eyes with your hands.  Make sure to get the flu shot every year.   Contact a doctor if:  Your symptoms do not get better in 2 weeks.  You vomit more than once or twice.  You have symptoms of loss of fluid from your body (dehydration). These include: ? Dark urine. ? Dry skin or eyes. ? Increased thirst. ? Headaches. ? Confusion. ? Muscle cramps. Get help right away if:  You cough up blood.  You have chest pain.  You have very bad shortness of breath.  You become dehydrated.  You faint or keep feeling like you are going to faint.  You keep vomiting.  You have a very bad headache.  Your fever or chills get worse. These symptoms may be an emergency. Do not wait to see if the symptoms will go away. Get medical help right away. Call your local emergency services (911 in the [place] Do not drive yourself to the hospital. Summary  Acute bronchitis is when air tubes in the lungs (bronchi) suddenly get swollen. In adults, acute bronchitis usually goes away within 2 weeks.  Take over-the-counter and prescription medicines only as told by your doctor.  Drink enough fluid to keep your pee (urine) pale yellow.  Contact a doctor if your symptoms do not improve after 2 weeks of treatment.  Get help right away if you cough up blood, faint, or have chest pain or shortness of breath. This information is not intended to replace advice given to you by your health care provider. Make sure you discuss any questions you have with your health care provider. Document Revised: 05/20/2019 Document Reviewed: 05/20/2019 Elsevier Patient Education  [place].

## 2021-04-04 NOTE — Progress Notes (Signed)
VIRTUAL VISIT VIA VIDEO  I connected with Brenda Rosales on 04/04/21 at 11:30 AM EDT by elemedicine application and verified that I am speaking with the correct person using two identifiers. Location patient: Home Location provider: [facility], Office Persons participating in the virtual visit: Patient, Dr. Raoul Pitch and Darnell Level. Cesar, CMA  I discussed the limitations of evaluation and management by telemedicine and the availability of in person appointments. The patient expressed understanding and agreed to proceed.   SUBJECTIVE Chief Complaint  Patient presents with  . Cough    Pt c/o productive cough, congestion, runny nose, post nasal drip, x 4 days; neg covid test at home;     HPI: Brenda Rosales is a 35 y.o. female present for acute illness. She reports cough, congestion , runny nose and PND of 4 days. She has taken a covid test on day 2-3 of illness and it was negative. Her symptoms are not improving and her cough became productive today.  Denies fever, chills. N/v/d.  moderna x3.  She has a sig PMH of asthma and diabetes. She reports sugars are controlled and she is complaint with inhalers. She has used her albuterol as needed for mild wheeze.  ROS: See pertinent positives and negatives per HPI.  Patient Active Problem List   Diagnosis Date Noted  . Allergic rhinitis due to animal (cat) (dog) hair and dander 11/20/2020  . Allergic rhinitis due to pollen 11/20/2020  . Atopic dermatitis 11/20/2020  . Overweight (BMI 25.0-29.9) 11/30/2017  . Diabetes mellitus without complication ([place]) 76/72/0947  . Hypertriglyceridemia 04/23/2016  . GERD 11/20/2010  . FATTY LIVER DISEASE 04/17/2008  . Asthma, persistent controlled 11/19/2007    Social History   Tobacco Use  . Smoking status: Never Smoker  . Smokeless tobacco: Never Used  Substance Use Topics  . Alcohol use: Yes    Alcohol/week: 1.0 standard drink    Types: 1 Standard drinks or equivalent per week    Comment:  SOCIALLY    Current Outpatient Medications:  .  albuterol (VENTOLIN HFA) 108 (90 Base) MCG/ACT inhaler, Inhale 2 puffs into the lungs every 6 (six) hours as needed for wheezing or shortness of breath. Refill upon request only. Can be filled with any albuterol on formulary., Disp: 8 g, Rfl: 11 .  amoxicillin-clavulanate (AUGMENTIN) 875-125 MG tablet, Take 1 tablet by mouth 2 (two) times daily., Disp: 20 tablet, Rfl: 0 .  atorvastatin (LIPITOR) 10 MG tablet, Take 1 tablet (10 mg total) by mouth daily., Disp: 90 tablet, Rfl: 3 .  benzonatate (TESSALON) 200 MG capsule, Take 1 capsule (200 mg total) by mouth 2 (two) times daily as needed for cough., Disp: 20 capsule, Rfl: 0 .  blood glucose meter kit and supplies, Dispense based on patient and insurance preference. Use up to four times daily as directed. DX E11.9, Disp: 1 each, Rfl: 11 .  Blood Glucose Monitoring Suppl (CONTOUR NEXT ONE) KIT, See admin instructions., Disp: , Rfl:  .  dapagliflozin propanediol (FARXIGA) 5 MG TABS tablet, Take 1 tablet (5 mg total) by mouth daily., Disp: 90 tablet, Rfl: 3 .  EPINEPHrine 0.3 mg/0.3 mL IJ SOAJ injection, Inject 0.3 mg into the muscle once., Disp: , Rfl:  .  etonogestrel-ethinyl estradiol (NUVARING) 0.12-0.015 MG/24HR vaginal ring, Place 1 each vaginally every 28 (twenty-eight) days. Insert vaginally and leave in place for 3 consecutive weeks, then remove for 1 week., Disp: 3 each, Rfl: 3 .  fenofibrate (TRICOR) 145 MG tablet, Take 1 tablet (145  mg total) by mouth daily., Disp: 90 tablet, Rfl: 3 .  glipiZIDE (GLUCOTROL) 5 MG tablet, Take 1 tablet (5 mg total) by mouth daily before breakfast., Disp: 90 tablet, Rfl: 3 .  glucose blood (CONTOUR NEXT TEST) test strip, 1 each by Other route daily. And lancets 1/day, Disp: 100 each, Rfl: 3 .  hydrOXYzine (ATARAX/VISTARIL) 25 MG tablet, TAKE 1 TABLET BY MOUTH EVERYDAY AT BEDTIME, Disp: 90 tablet, Rfl: 1 .  levocetirizine (XYZAL) 5 MG tablet, TAKE 1 TABLET BY MOUTH  EVERY DAY IN THE EVENING, Disp: 90 tablet, Rfl: 0 .  metFORMIN (GLUCOPHAGE-XR) 500 MG 24 hr tablet, Take 4 tablets (2,000 mg total) by mouth daily., Disp: 360 tablet, Rfl: 3 .  montelukast (SINGULAIR) 10 MG tablet, every evening., Disp: , Rfl:  .  SYMBICORT 160-4.5 MCG/ACT inhaler, Inhale 2 puffs into the lungs 2 (two) times daily., Disp: , Rfl: 3 .  tacrolimus (PROTOPIC) 0.1 % ointment, Apply topically 2 (two) times daily. (Patient taking differently: Apply 1 application topically 2 (two) times daily as needed (for rash, skin issues).), Disp: 100 g, Rfl: 1 .  triamcinolone cream (KENALOG) 0.1 %, Apply 1 application topically 2 (two) times daily as needed., Disp: 453.6 g, Rfl: 2  Allergies  Allergen Reactions  . Vancomycin Other (See Comments)    redmans syndrome   . Januvia [Sitagliptin] Other (See Comments)    Dizziness    OBJECTIVE: There were no vitals taken for this visit. Gen: No acute distress. Nontoxic in appearance.  HENT: AT. [place].  MMM.  Eyes:Pupils Equal Round Reactive to light, Extraocular movements intact,  Conjunctiva without redness, discharge or icterus. Chest: Cough or shortness of breath not present on exam.  Neuro:  Alert. Oriented x3  Psych: Normal affect and demeanor. Normal speech. Normal thought content and judgment.  ASSESSMENT AND PLAN: Brenda Rosales is a 35 y.o. female present for  1. Moderate persistent asthmatic bronchitis with acute exacerbation Rest, hydrate.  +/- flonase, mucinex (DM if cough), nettie pot or nasal saline.  augmentin prescribed, take until completed.  Tessalon perles for cough.  Would like to try to avoid steroids with her dm if possible- she agrees and feels her albuterol is helping with occ. Wheeze  If cough present it can last up to 6-8 weeks.  F/U 2 weeks if not improved.     Howard Pouch, DO 04/04/2021   Return if symptoms worsen or fail to improve.  No orders of the defined types were placed in this encounter.  Meds  ordered this encounter  Medications  . albuterol (VENTOLIN HFA) 108 (90 Base) MCG/ACT inhaler    Sig: Inhale 2 puffs into the lungs every 6 (six) hours as needed for wheezing or shortness of breath. Refill upon request only. Can be filled with any albuterol on formulary.    Dispense:  8 g    Refill:  11  . benzonatate (TESSALON) 200 MG capsule    Sig: Take 1 capsule (200 mg total) by mouth 2 (two) times daily as needed for cough.    Dispense:  20 capsule    Refill:  0  . amoxicillin-clavulanate (AUGMENTIN) 875-125 MG tablet    Sig: Take 1 tablet by mouth 2 (two) times daily.    Dispense:  20 tablet    Refill:  0   Referral Orders  No referral(s) requested today

## 2021-04-09 ENCOUNTER — Encounter: Payer: Self-pay | Admitting: Family Medicine

## 2021-04-10 MED ORDER — PREDNISONE 50 MG PO TABS: 50.0000 mg | ORAL_TABLET | Freq: Every day | ORAL | 0 refills | Status: DC

## 2021-04-10 NOTE — Telephone Encounter (Signed)
Please advise patient I have called in the prednisone for her.  Hope she feels better soon.

## 2021-05-23 ENCOUNTER — Ambulatory Visit: Payer: BC Managed Care – PPO | Admitting: Endocrinology

## 2021-05-23 ENCOUNTER — Other Ambulatory Visit: Payer: Self-pay

## 2021-05-23 VITALS — BP 110/70 | HR 91 | Ht 63.0 in | Wt 153.4 lb

## 2021-05-23 DIAGNOSIS — E119 Type 2 diabetes mellitus without complications: Secondary | ICD-10-CM

## 2021-05-23 LAB — POCT GLYCOSYLATED HEMOGLOBIN (HGB A1C): Hemoglobin A1C: 7.8 % — AB (ref 4.0–5.6)

## 2021-05-23 MED ORDER — RYBELSUS 3 MG PO TABS: 3.0000 mg | ORAL_TABLET | Freq: Every day | ORAL | 3 refills | Status: DC

## 2021-05-23 NOTE — Patient Instructions (Addendum)
check your blood sugar once a day.  vary the time of day when you check, between before the 3 meals, and at bedtime.  also check if you have symptoms of your blood sugar being too high or too low.  please keep a record of the readings and bring it to your next appointment here (or you can bring the meter itself).  You can write it on any piece of paper.  please call us sooner if your blood sugar goes below 70, or if most of your readings are over 200.  We will need to take this complex situation in stages.  I have sent a prescription to your pharmacy, to add "Rybelsus" and: Please continue the same other medications.   Please come back for a follow-up appointment in 3 months.

## 2021-05-23 NOTE — Progress Notes (Signed)
Subjective:    Patient ID: Brenda Rosales, female    DOB: 1986-03-04, 35 y.o.   MRN: [id number]  HPI Pt returns for f/u of diabetes mellitus: DM type: 2 Dx'ed: 1655 Complications: none Therapy: 3 oral meds GDM: never DKA: never Severe hypoglycemia: never Pancreatitis: never Pancreatic imaging: normal on 2015 CT SDOH: none Other: She did not tolerate Januvia (N/D).  Interval history: Pt says cbg varies from 216-250.  She took prednisone x 2 weeks, 1 month ago.  Past Medical History:  Diagnosis Date   Allergy    Anxiety    ASTHMA 11/19/2007   CHICKENPOX 11/19/2007   Diabetes mellitus without complication ([place])    diet controlled   Duodenitis    Dysmenorrhea 11/19/2007   DYSPHAGIA 11/19/2010   ECZEMA 11/19/2007   Esophageal stricture    Esophagitis    FATTY LIVER DISEASE 04/17/2008   GERD 11/20/2010   Hyperlipidemia    IBS (irritable bowel syndrome)    Infection of eyelid 08/07/2011   Low vitamin D level 2018   Other and unspecified noninfectious gastroenteritis and colitis(558.9) 02/2014   RHINOSINUSITIS, CHRONIC 12/09/2007   TB SKIN TEST, POSITIVE 11/19/2007    Past Surgical History:  Procedure Laterality Date   CT SINUS LTD W/O CM  09/23/07   Mole excision     x's 4    Social History   Socioeconomic History   Marital status: Single    Spouse name: Not on file   Number of children: 0   Years of education: Not on file   Highest education level: Not on file  Occupational History   Occupation: Product manager: [employer]  Tobacco Use   Smoking status: Never   Smokeless tobacco: Never  Vaping Use   Vaping Use: Never used  Substance and Sexual Activity   Alcohol use: Yes    Alcohol/week: 1.0 standard drink    Types: 1 Standard drinks or equivalent per week    Comment: SOCIALLY   Drug use: No   Sexual activity: Not Currently    Partners: Male    Birth control/protection: Inserts    Comment: Nuvaring  Other Topics Concern   Not on file  Social  History Narrative   Adopted from [place].    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   Drinks caffeine, uses herbal remedies   Wears her seatbelt, wears bicycle helmet   Reports routine exercise    Smoke detector in the home.   Feels safe in relationships.     Social Determinants of Health   Financial Resource Strain: Not on file  Food Insecurity: Not on file  Transportation Needs: Not on file  Physical Activity: Not on file  Stress: Not on file  Social Connections: Not on file  Intimate Partner Violence: Not on file    Current Outpatient Medications on File Prior to Visit  Medication Sig Dispense Refill   albuterol (VENTOLIN HFA) 108 (90 Base) MCG/ACT inhaler Inhale 2 puffs into the lungs every 6 (six) hours as needed for wheezing or shortness of breath. Refill upon request only. Can be filled with any albuterol on formulary. 8 g 11   atorvastatin (LIPITOR) 10 MG tablet Take 1 tablet (10 mg total) by mouth daily. 90 tablet 3   blood glucose meter kit and supplies Dispense based on patient and insurance preference. Use up to four times daily as directed. DX E11.9 1 each 11   Blood  Glucose Monitoring Suppl (CONTOUR NEXT ONE) KIT See admin instructions.     dapagliflozin propanediol (FARXIGA) 5 MG TABS tablet Take 1 tablet (5 mg total) by mouth daily. 90 tablet 3   EPINEPHrine 0.3 mg/0.3 mL IJ SOAJ injection Inject 0.3 mg into the muscle once.     etonogestrel-ethinyl estradiol (NUVARING) 0.12-0.015 MG/24HR vaginal ring Place 1 each vaginally every 28 (twenty-eight) days. Insert vaginally and leave in place for 3 consecutive weeks, then remove for 1 week. 3 each 3   fenofibrate (TRICOR) 145 MG tablet Take 1 tablet (145 mg total) by mouth daily. 90 tablet 3   glipiZIDE (GLUCOTROL) 5 MG tablet Take 1 tablet (5 mg total) by mouth daily before breakfast. 90 tablet 3   glucose blood (CONTOUR NEXT TEST) test strip 1 each by Other route daily. And  lancets 1/day 100 each 3   hydrOXYzine (ATARAX/VISTARIL) 25 MG tablet TAKE 1 TABLET BY MOUTH EVERYDAY AT BEDTIME 90 tablet 1   levocetirizine (XYZAL) 5 MG tablet TAKE 1 TABLET BY MOUTH EVERY DAY IN THE EVENING 90 tablet 0   metFORMIN (GLUCOPHAGE-XR) 500 MG 24 hr tablet Take 4 tablets (2,000 mg total) by mouth daily. 360 tablet 3   montelukast (SINGULAIR) 10 MG tablet every evening.     SYMBICORT 160-4.5 MCG/ACT inhaler Inhale 2 puffs into the lungs 2 (two) times daily.  3   tacrolimus (PROTOPIC) 0.1 % ointment Apply topically 2 (two) times daily. (Patient taking differently: Apply 1 application topically 2 (two) times daily as needed (for rash, skin issues).) 100 g 1   triamcinolone cream (KENALOG) 0.1 % Apply 1 application topically 2 (two) times daily as needed. 453.6 g 2   No current facility-administered medications on file prior to visit.    Allergies  Allergen Reactions   Vancomycin Other (See Comments)    redmans syndrome    Januvia [Sitagliptin] Other (See Comments)    Dizziness    Family History  Adopted: Yes    BP 110/70 (BP Location: Left Arm, Patient Position: Sitting, Cuff Size: Normal)   Pulse 91   Ht _0  (1.6 m)   Wt 153 lb 6.4 oz (69.6 kg)   SpO2 98%   BMI 27.17 kg/m   Review of Systems     Objective:   Physical Exam Pulses: dorsalis pedis intact bilat.   MSK: no deformity of the feet CV: no leg edema Skin:  no ulcer on the feet.  normal color and temp on the feet. Neuro: sensation is intact to touch on the feet  Lab Results  Component Value Date   HGBA1C 7.8 (A) 05/23/2021       Assessment & Plan:  Type 2 DM: uncontrolled, prob due to prednisone  Patient Instructions  check your blood sugar once a day.  vary the time of day when you check, between before the 3 meals, and at bedtime.  also check if you have symptoms of your blood sugar being too high or too low.  please keep a record of the readings and bring it to your next appointment here (or  you can bring the meter itself).  You can write it on any piece of paper.  please call us sooner if your blood sugar goes below 70, or if most of your readings are over 200.  We will need to take this complex situation in stages.  I have sent a prescription to your pharmacy, to add "Rybelsus" and: Please continue the same other medications.  Please come back for a follow-up appointment in 3 months.

## 2021-07-19 ENCOUNTER — Other Ambulatory Visit: Payer: Self-pay

## 2021-08-28 ENCOUNTER — Ambulatory Visit: Payer: BC Managed Care – PPO | Admitting: Endocrinology

## 2021-08-28 ENCOUNTER — Other Ambulatory Visit: Payer: Self-pay

## 2021-08-28 VITALS — BP 100/60 | HR 93 | Ht 63.0 in | Wt 150.0 lb

## 2021-08-28 DIAGNOSIS — E119 Type 2 diabetes mellitus without complications: Secondary | ICD-10-CM

## 2021-08-28 LAB — POCT GLYCOSYLATED HEMOGLOBIN (HGB A1C): Hemoglobin A1C: 6.8 % — AB (ref 4.0–5.6)

## 2021-08-28 MED ORDER — DAPAGLIFLOZIN PROPANEDIOL 10 MG PO TABS: 10.0000 mg | ORAL_TABLET | Freq: Every day | ORAL | 3 refills | Status: DC

## 2021-08-28 MED ORDER — GLIPIZIDE 5 MG PO TABS: 2.5000 mg | ORAL_TABLET | Freq: Every day | ORAL | 3 refills | Status: DC

## 2021-08-28 NOTE — Progress Notes (Signed)
Subjective:    Patient ID: Brenda Rosales, female    DOB: [birth year], 35 y.o.   MRN: [id number]  HPI Pt returns for f/u of diabetes mellitus: DM type: 2 Dx'ed: 0045 Complications: none Therapy: 4 oral meds GDM: never DKA: never Severe hypoglycemia: never Pancreatitis: never Pancreatic imaging: normal on 2015 CT SDOH: none Other: She did not tolerate Januvia (N/D).  Interval history: Pt says cbg varies from 125-160.  pt states she feels well in general.  She takes meds as rx'ed.  Past Medical History:  Diagnosis Date   Allergy    Anxiety    ASTHMA 11/19/2007   CHICKENPOX 11/19/2007   Diabetes mellitus without complication ([place])    diet controlled   Duodenitis    Dysmenorrhea 11/19/2007   DYSPHAGIA 11/19/2010   ECZEMA 11/19/2007   Esophageal stricture    Esophagitis    FATTY LIVER DISEASE 04/17/2008   GERD 11/20/2010   Hyperlipidemia    IBS (irritable bowel syndrome)    Infection of eyelid 08/07/2011   Low vitamin D level 2018   Other and unspecified noninfectious gastroenteritis and colitis(558.9) 02/2014   RHINOSINUSITIS, CHRONIC 12/09/2007   TB SKIN TEST, POSITIVE 11/19/2007    Past Surgical History:  Procedure Laterality Date   CT SINUS LTD W/O CM  09/23/07   Mole excision     x's 4    Social History   Socioeconomic History   Marital status: Single    Spouse name: Not on file   Number of children: 0   Years of education: Not on file   Highest education level: Not on file  Occupational History   Occupation: Product manager: [employer]  Tobacco Use   Smoking status: Never   Smokeless tobacco: Never  Vaping Use   Vaping Use: Never used  Substance and Sexual Activity   Alcohol use: Yes    Alcohol/week: 1.0 standard drink    Types: 1 Standard drinks or equivalent per week    Comment: SOCIALLY   Drug use: No   Sexual activity: Not Currently    Partners: Male    Birth control/protection: Inserts    Comment: Nuvaring  Other Topics Concern   Not  on file  Social History Narrative   Adopted from [place].    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   Drinks caffeine, uses herbal remedies   Wears her seatbelt, wears bicycle helmet   Reports routine exercise    Smoke detector in the home.   Feels safe in relationships.     Social Determinants of Health   Financial Resource Strain: Not on file  Food Insecurity: Not on file  Transportation Needs: Not on file  Physical Activity: Not on file  Stress: Not on file  Social Connections: Not on file  Intimate Partner Violence: Not on file    Current Outpatient Medications on File Prior to Visit  Medication Sig Dispense Refill   albuterol (VENTOLIN HFA) 108 (90 Base) MCG/ACT inhaler Inhale 2 puffs into the lungs every 6 (six) hours as needed for wheezing or shortness of breath. Refill upon request only. Can be filled with any albuterol on formulary. 8 g 11   atorvastatin (LIPITOR) 10 MG tablet Take 1 tablet (10 mg total) by mouth daily. 90 tablet 3   blood glucose meter kit and supplies Dispense based on patient and insurance preference. Use up to four times daily as directed. DX E11.9 1 each  11   Blood Glucose Monitoring Suppl (CONTOUR NEXT ONE) KIT See admin instructions.     EPINEPHrine 0.3 mg/0.3 mL IJ SOAJ injection Inject 0.3 mg into the muscle once.     etonogestrel-ethinyl estradiol (NUVARING) 0.12-0.015 MG/24HR vaginal ring Place 1 each vaginally every 28 (twenty-eight) days. Insert vaginally and leave in place for 3 consecutive weeks, then remove for 1 week. 3 each 3   fenofibrate (TRICOR) 145 MG tablet Take 1 tablet (145 mg total) by mouth daily. 90 tablet 3   glucose blood (CONTOUR NEXT TEST) test strip 1 each by Other route daily. And lancets 1/day 100 each 3   hydrOXYzine (ATARAX/VISTARIL) 25 MG tablet TAKE 1 TABLET BY MOUTH EVERYDAY AT BEDTIME 90 tablet 1   levocetirizine (XYZAL) 5 MG tablet TAKE 1 TABLET BY MOUTH EVERY DAY IN THE  EVENING 90 tablet 0   metFORMIN (GLUCOPHAGE-XR) 500 MG 24 hr tablet Take 4 tablets (2,000 mg total) by mouth daily. 360 tablet 3   montelukast (SINGULAIR) 10 MG tablet every evening.     Semaglutide (RYBELSUS) 3 MG TABS Take 3 mg by mouth daily. 90 tablet 3   SYMBICORT 160-4.5 MCG/ACT inhaler Inhale 2 puffs into the lungs 2 (two) times daily.  3   tacrolimus (PROTOPIC) 0.1 % ointment Apply topically 2 (two) times daily. (Patient taking differently: Apply 1 application topically 2 (two) times daily as needed (for rash, skin issues).) 100 g 1   triamcinolone cream (KENALOG) 0.1 % Apply 1 application topically 2 (two) times daily as needed. 453.6 g 2   No current facility-administered medications on file prior to visit.    Allergies  Allergen Reactions   Vancomycin Other (See Comments)    redmans syndrome    Januvia [Sitagliptin] Other (See Comments)    Dizziness    Family History  Adopted: Yes    BP 100/60 (BP Location: Right Arm, Patient Position: Sitting, Cuff Size: Normal)   Pulse 93   Ht '5\' 3"'  (1.6 m)   Wt 150 lb (68 kg)   SpO2 96%   BMI 26.57 kg/m    Review of Systems She denies hypoglycemia    Objective:   Physical Exam Pulses: dorsalis pedis intact bilat.   MSK: no deformity of the feet CV: no leg edema Skin:  no ulcer on the feet.  normal color and temp on the feet. Neuro: sensation is intact to touch on the feet  Lab Results  Component Value Date   CREATININE 0.64 03/07/2021   BUN 13 03/07/2021   NA 136 03/07/2021   K 4.4 03/07/2021   CL 104 03/07/2021   CO2 24 03/07/2021   A1c=6.8%    Assessment & Plan:  Type 2 DM: overcontrolled, for this SU-containing regimen  Patient Instructions  check your blood sugar once a day.  vary the time of day when you check, between before the 3 meals, and at bedtime.  also check if you have symptoms of your blood sugar being too high or too low.  please keep a record of the readings and bring it to your next appointment  here (or you can bring the meter itself).  You can write it on any piece of paper.  please call us sooner if your blood sugar goes below 70, or if most of your readings are over 200.  We will need to take this complex situation in stages.  I have sent a prescription to your pharmacy, to increase the Farxiga, and reduce the glipizide, and:  Please continue the same other medications.   Please come back for a follow-up appointment in 3 months.

## 2021-08-28 NOTE — Patient Instructions (Signed)
check your blood sugar once a day.  vary the time of day when you check, between before the 3 meals, and at bedtime.  also check if you have symptoms of your blood sugar being too high or too low.  please keep a record of the readings and bring it to your next appointment here (or you can bring the meter itself).  You can write it on any piece of paper.  please call us sooner if your blood sugar goes below 70, or if most of your readings are over 200.  We will need to take this complex situation in stages.  I have sent a prescription to your pharmacy, to increase the Farxiga, and reduce the glipizide, and: Please continue the same other medications.   Please come back for a follow-up appointment in 3 months.

## 2021-09-10 ENCOUNTER — Telehealth: Payer: Self-pay | Admitting: Family Medicine

## 2021-09-10 NOTE — Telephone Encounter (Signed)
Pt states she will call [facility]

## 2021-09-10 NOTE — Telephone Encounter (Signed)
Pt called and said that she was [facility] at [facility] of [address] yesterday and that they diagnosed her with Flu type A, She said that they gave her tamiflu but today she cant keep anything down, she said it will happen even after she tries to drink water and shes unsure what to do. She said they are supposed to be sending the results over here as well. Please advise. Pt call back [phone]

## 2021-09-10 NOTE — Telephone Encounter (Signed)
LVM for pt to CB regarding prior message.   Note: Pt will need an appt or will have to call [facility] where she was evaluated.Marland Kitchen

## 2021-10-21 ENCOUNTER — Encounter: Payer: Self-pay | Admitting: Family Medicine

## 2021-10-21 ENCOUNTER — Ambulatory Visit (INDEPENDENT_AMBULATORY_CARE_PROVIDER_SITE_OTHER): Payer: BC Managed Care – PPO | Admitting: Family Medicine

## 2021-10-21 ENCOUNTER — Other Ambulatory Visit: Payer: Self-pay

## 2021-10-21 VITALS — BP 106/65 | HR 76 | Temp 98.3°F | Ht 63.5 in | Wt 154.0 lb

## 2021-10-21 DIAGNOSIS — Z23 Encounter for immunization: Secondary | ICD-10-CM | POA: Diagnosis not present

## 2021-10-21 DIAGNOSIS — Z0000 Encounter for general adult medical examination without abnormal findings: Secondary | ICD-10-CM

## 2021-10-21 DIAGNOSIS — E781 Pure hyperglyceridemia: Secondary | ICD-10-CM | POA: Diagnosis not present

## 2021-10-21 DIAGNOSIS — E663 Overweight: Secondary | ICD-10-CM | POA: Diagnosis not present

## 2021-10-21 DIAGNOSIS — E1169 Type 2 diabetes mellitus with other specified complication: Secondary | ICD-10-CM | POA: Diagnosis not present

## 2021-10-21 LAB — MICROALBUMIN / CREATININE URINE RATIO
Creatinine,U: 35.7 mg/dL
Microalb Creat Ratio: 2 mg/g (ref 0.0–30.0)
Microalb, Ur: <0.7 mg/dL (ref 0.0–1.9)

## 2021-10-21 LAB — COMPREHENSIVE METABOLIC PANEL
ALT: 22 U/L (ref 0–35)
AST: 20 U/L (ref 0–37)
Albumin: 4 g/dL (ref 3.5–5.2)
Alkaline Phosphatase: 55 U/L (ref 39–117)
BUN: 13 mg/dL (ref 6–23)
CO2: 23 mEq/L (ref 19–32)
Calcium: 9.1 mg/dL (ref 8.4–10.5)
Chloride: 98 mEq/L (ref 96–112)
Creatinine, Ser: 0.68 mg/dL (ref 0.40–1.20)
GFR: 112.51 mL/min (ref >60.00)
Glucose, Bld: 407 mg/dL — ABNORMAL HIGH (ref 70–99)
Potassium: 4.3 mEq/L (ref 3.5–5.1)
Sodium: 132 mEq/L — ABNORMAL LOW (ref 135–145)
Total Bilirubin: 0.3 mg/dL (ref 0.2–1.2)
Total Protein: 6.8 g/dL (ref 6.0–8.3)

## 2021-10-21 LAB — LIPID PANEL
Cholesterol: 172 mg/dL (ref 0–200)
HDL: 55.2 mg/dL (ref >39.00)
LDL Cholesterol: 82 mg/dL (ref 0–99)
NonHDL: 116.92
Total CHOL/HDL Ratio: 3
Triglycerides: 176 mg/dL — ABNORMAL HIGH (ref 0.0–149.0)
VLDL: 35.2 mg/dL (ref 0.0–40.0)

## 2021-10-21 LAB — CBC WITH DIFFERENTIAL/PLATELET
Basophils Absolute: 0.1 10*3/uL (ref 0.0–0.1)
Basophils Relative: 1.4 % (ref 0.0–3.0)
Eosinophils Absolute: 0.9 10*3/uL — ABNORMAL HIGH (ref 0.0–0.7)
Eosinophils Relative: 14.1 % — ABNORMAL HIGH (ref 0.0–5.0)
HCT: 40.1 % (ref 36.0–46.0)
Hemoglobin: 13.6 g/dL (ref 12.0–15.0)
Lymphocytes Relative: 27.4 % (ref 12.0–46.0)
Lymphs Abs: 1.8 10*3/uL (ref 0.7–4.0)
MCHC: 33.9 g/dL (ref 30.0–36.0)
MCV: 91.4 fl (ref 78.0–100.0)
Monocytes Absolute: 0.4 10*3/uL (ref 0.1–1.0)
Monocytes Relative: 6.4 % (ref 3.0–12.0)
Neutro Abs: 3.3 10*3/uL (ref 1.4–7.7)
Neutrophils Relative %: 50.7 % (ref 43.0–77.0)
Platelets: 262 10*3/uL (ref 150.0–400.0)
RBC: 4.38 Mil/uL (ref 3.87–5.11)
RDW: 13.2 % (ref 11.5–15.5)
WBC: 6.5 10*3/uL (ref 4.0–10.5)

## 2021-10-21 LAB — TSH: TSH: 1.84 u[IU]/mL (ref 0.35–5.50)

## 2021-10-21 MED ORDER — FENOFIBRATE 145 MG PO TABS: 145.0000 mg | ORAL_TABLET | Freq: Every day | ORAL | 3 refills | Status: DC

## 2021-10-21 MED ORDER — ATORVASTATIN CALCIUM 10 MG PO TABS: 10.0000 mg | ORAL_TABLET | Freq: Every day | ORAL | 3 refills | Status: DC

## 2021-10-21 NOTE — Progress Notes (Signed)
This visit occurred during the SARS-CoV-2 public health emergency.  Safety protocols were in place, including screening questions prior to the visit, additional usage of staff PPE, and extensive cleaning of exam room while observing appropriate contact time as indicated for disinfecting solutions.    Patient ID: Brenda Rosales, female  DOB: 03/05/1986, 35 y.o.   MRN: [id number] Patient Care Team    Relationship Specialty Notifications Start End  Ma Hillock, DO PCP - General Family Medicine  04/23/16    Comment: Patient request  Nunzio Cobbs, MD Consulting Physician Obstetrics and Gynecology  04/23/16   Deneise Lever, MD Consulting Physician Pulmonary Disease  04/23/16   Irene Shipper, MD Consulting Physician Gastroenterology  04/23/16   Janne Napoleon, PA-C (Inactive)  Dermatology  05/30/20     Chief Complaint  Patient presents with   Annual Exam    Pt is not fasting;    Subjective: Brenda Rosales is a 35 y.o.  Female  present for CPE . All past medical history, surgical history, allergies, family history, immunizations, medications and social history were updated in the electronic medical record today. All recent labs, ED visits and hospitalizations within the last year were reviewed.  Health maintenance:  Colonoscopy: adopted. Routine screen at 49.  Mammogram: adopted. Routine screen at 40 Cervical cancer screening: last pap: 2021- GYN Immunizations: tdap updated today, Influenza UTD 2022 (encouraged yearly), PNA series completed Infectious disease screening: HIV and Hep C completed DEXA: routine screen Assistive device: none Oxygen MWU:XLKG Patient has a Dental home. Hospitalizations/ED visits: reviewed  Diabetes:  Pt now est with Dr. Loanne Drilling for diabetic management.  Prescribed  metformin 1000 mg BID, Farxiga 5 mg and Glipizide 5 mg QD. Patient denies dizziness, hyperglycemic or hypoglycemic events. Patient denies numbness, tingling in the  extremities or nonhealing wounds of feet.  Januvia  Caused dizziness.    HLD/overweight: Pt reports she is taking her tricor daily and Lipitor. now. She is back to exercising and eating healthier options. She has lost 8 lbs this year  Depression screen [facility] 2/9 10/21/2021 11/20/2020 06/09/2018 11/30/2017 08/14/2017  Decreased Interest 0 0 0 0 0  Down, Depressed, Hopeless 0 0 0 0 0  PHQ - 2 Score 0 0 0 0 0   No flowsheet data found.   Immunization History  Administered Date(s) Administered   Influenza Inj Mdck Quad With Preservative 08/19/2018   Influenza Whole 08/19/2009   Influenza,inj,Quad PF,6+ Mos 07/31/2016, 08/02/2019   Influenza-Unspecified 08/11/2015, 07/12/2017, 07/11/2020, 10/18/2021   Moderna Covid-19 Vaccine Bivalent Booster 30yr & up 10/18/2021   Moderna Sars-Covid-2 Vaccination 01/07/2020, 02/04/2020, 09/19/2020   Pneumococcal Conjugate-13 11/06/2016   Pneumococcal Polysaccharide-23 07/23/2007, 06/09/2018   Td 09/11/2004   Tdap 07/04/2012, 10/21/2021     Past Medical History:  Diagnosis Date   Allergy    Anxiety    ASTHMA 11/19/2007   Asthma    CHICKENPOX 11/19/2007   [place]    diet controlled   Duodenitis    Dysmenorrhea 11/19/2007   DYSPHAGIA 11/19/2010   ECZEMA 11/19/2007   Esophageal stricture    Esophagitis    FATTY LIVER DISEASE 04/17/2008   GERD 11/20/2010   Hyperlipidemia    IBS (irritable bowel syndrome)    Infection of eyelid 08/07/2011   Low vitamin D level 2018   Other and unspecified noninfectious gastroenteritis and colitis(558.9) 02/2014   RHINOSINUSITIS, CHRONIC 12/09/2007   TB SKIN TEST, POSITIVE 11/19/2007   Allergies  Allergen Reactions  Vancomycin Other (See Comments)    redmans syndrome    Januvia [Sitagliptin] Other (See Comments)    Dizziness   Past Surgical History:  Procedure Laterality Date   CT SINUS LTD W/O CM  09/23/2007   Mole excision     x's 4   Family History  Adopted:  Yes   Social History   Social History Narrative   Adopted from [place].    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   Drinks caffeine, uses herbal remedies   Wears her seatbelt, wears bicycle helmet   Reports routine exercise    Smoke detector in the home.   Feels safe in relationships.      Allergies as of 10/21/2021       Reactions   Vancomycin Other (See Comments)   redmans syndrome    Januvia [sitagliptin] Other (See Comments)   Dizziness        Medication List        Accurate as of October 21, 2021  8:25 AM. If you have any questions, ask your nurse or doctor.          albuterol 108 (90 Base) MCG/ACT inhaler Commonly known as: VENTOLIN HFA Inhale 2 puffs into the lungs every 6 (six) hours as needed for wheezing or shortness of breath. Refill upon request only. Can be filled with any albuterol on formulary.   atorvastatin 10 MG tablet Commonly known as: LIPITOR Take 1 tablet (10 mg total) by mouth daily.   blood glucose meter kit and supplies Dispense based on patient and insurance preference. Use up to four times daily as directed. DX E11.9   Contour Next One Kit See admin instructions.   Contour Next Test test strip Generic drug: glucose blood 1 each by Other route daily. And lancets 1/day   dapagliflozin propanediol 10 MG Tabs tablet Commonly known as: Farxiga Take 1 tablet (10 mg total) by mouth daily before breakfast.   EPINEPHrine 0.3 mg/0.3 mL Soaj injection Commonly known as: EPI-PEN Inject 0.3 mg into the muscle once.   etonogestrel-ethinyl estradiol 0.12-0.015 MG/24HR vaginal ring Commonly known as: NuvaRing Place 1 each vaginally every 28 (twenty-eight) days. Insert vaginally and leave in place for 3 consecutive weeks, then remove for 1 week.   fenofibrate 145 MG tablet Commonly known as: TRICOR Take 1 tablet (145 mg total) by mouth daily.   glipiZIDE 5 MG tablet Commonly known as:  GLUCOTROL Take 0.5 tablets (2.5 mg total) by mouth daily before breakfast.   hydrOXYzine 25 MG tablet Commonly known as: ATARAX TAKE 1 TABLET BY MOUTH EVERYDAY AT BEDTIME   levocetirizine 5 MG tablet Commonly known as: XYZAL TAKE 1 TABLET BY MOUTH EVERY DAY IN THE EVENING   metFORMIN 500 MG 24 hr tablet Commonly known as: GLUCOPHAGE-XR Take 4 tablets (2,000 mg total) by mouth daily.   montelukast 10 MG tablet Commonly known as: SINGULAIR every evening.   Rybelsus 3 MG Tabs Generic drug: Semaglutide Take 3 mg by mouth daily.   Symbicort 160-4.5 MCG/ACT inhaler Generic drug: budesonide-formoterol Inhale 2 puffs into the lungs 2 (two) times daily.   tacrolimus 0.1 % ointment Commonly known as: Protopic Apply topically 2 (two) times daily. What changed:  how much to take when to take this reasons to take this   triamcinolone cream 0.1 % Commonly known as: KENALOG Apply 1 application topically 2 (two) times daily as needed.        All past  medical history, surgical history, allergies, family history, immunizations andmedications were updated in the EMR today and reviewed under the history and medication portions of their EMR.     Recent Results (from the past 2160 hour(s))  POCT glycosylated hemoglobin (Hb A1C)     Status: Abnormal   Collection Time: 08/28/21  3:38 PM  Result Value Ref Range   Hemoglobin A1C 6.8 (A) 4.0 - 5.6 %   HbA1c POC (<> result, manual entry)     HbA1c, POC (prediabetic range)     HbA1c, POC (controlled diabetic range)       ROS 14 pt review of systems performed and negative (unless mentioned in an HPI)  Objective:  BP 106/65   Pulse 76   Temp 98.3 F (36.8 C) (Oral)   Ht 5' 3.5" (1.613 m)   Wt 154 lb (69.9 kg)   LMP 10/13/2021   SpO2 99%   BMI 26.85 kg/m   Physical Exam Constitutional:      General: She is not in acute distress.    Appearance: Normal appearance. She is not ill-appearing or toxic-appearing.  HENT:      Head: Normocephalic and atraumatic.     Right Ear: Tympanic membrane, ear canal and external ear normal. There is no impacted cerumen.     Left Ear: Tympanic membrane, ear canal and external ear normal. There is no impacted cerumen.     Nose: No congestion or rhinorrhea.     Mouth/Throat:     Mouth: Mucous membranes are moist.     Pharynx: Oropharynx is clear. No oropharyngeal exudate or posterior oropharyngeal erythema.  Eyes:     General: No scleral icterus.       Right eye: No discharge.        Left eye: No discharge.     Extraocular Movements: Extraocular movements intact.     Conjunctiva/sclera: Conjunctivae normal.     Pupils: Pupils are equal, round, and reactive to light.  Cardiovascular:     Rate and Rhythm: Normal rate and regular rhythm.     Pulses: Normal pulses.     Heart sounds: Normal heart sounds. No murmur heard.   No friction rub. No gallop.  Pulmonary:     Effort: Pulmonary effort is normal. No respiratory distress.     Breath sounds: Normal breath sounds. No stridor. No wheezing, rhonchi or rales.  Chest:     Chest wall: No tenderness.  Abdominal:     General: Abdomen is flat. Bowel sounds are normal. There is no distension.     Palpations: Abdomen is soft. There is no mass.     Tenderness: There is no abdominal tenderness. There is no right CVA tenderness, left CVA tenderness, guarding or rebound.     Hernia: No hernia is present.  Musculoskeletal:        General: No swelling, tenderness or deformity. Normal range of motion.     Cervical back: Normal range of motion and neck supple. No rigidity or tenderness.     Right lower leg: No edema.     Left lower leg: No edema.  Lymphadenopathy:     Cervical: No cervical adenopathy.  Skin:    General: Skin is warm and dry.     Coloration: Skin is not jaundiced or pale.     Findings: No bruising, erythema, lesion or rash.  Neurological:     General: No focal deficit present.     Mental Status: She is alert and  oriented to person,  place, and time. Mental status is at baseline.     Cranial Nerves: No cranial nerve deficit.     Sensory: No sensory deficit.     Motor: No weakness.     Coordination: Coordination normal.     Gait: Gait normal.     Deep Tendon Reflexes: Reflexes normal.  Psychiatric:        Mood and Affect: Mood normal.        Behavior: Behavior normal.        Thought Content: Thought content normal.        Judgment: Judgment normal.      No results found.  Assessment/plan: Brenda Rosales is a 35 y.o. female present for CPE  Diabetes mellitus without complication (HCC)/overweight - now established with Dr. Loanne Drilling- endo.  reviewed recent labs collected at Endocrine.  -  Prescribed: Metformin, Farxiga and glip through endo -Tried medications: Januvia >> upset her stomach  A1c: 6.0 --> 6.6--> 7.4 --> 6.7--> 7.3--> 8.0--> 8.0 >> 8.9 >>> 9.1>>>6.5 (lost to follow up)> 9.1 today > 7.6>6.8 at endo  Urine Microalbumin w/creat.: UTD - diabetic diet and routine exercise encouraged.  - eye exam: [facility] on [address]. 06/2020>encouraged her to schedule - foot exam completed 12/10/2020 - flu shot UTD 2021 - PPSV 23 completed 06/09/18, prevnar 10/2016  - Willl continue to follow w/ endo   Hyperlipidemia/overweight:  - She is back to exercising and has lost weight.  - encouraged routine exercise and dietary modifications.  - educated pt on LDL goals in a diabetic.  - continue tricor 145 mg qd.  - continue lipitor 10 mg - CBC with Differential/Platelet - Comprehensive metabolic panel - Lipid panel - TSH  Type 2 diabetes mellitus with other specified complication, without long-term current use of insulin (HCC) - Urine Microalbumin w/creat. ratio   Routine general medical examination at a health care facility Colonoscopy: adopted. Routine screen at 52.  Mammogram: adopted. Routine screen at 40 Cervical cancer screening: last pap: 2021- GYN Immunizations: tdap updated today,  Influenza UTD 2022 (encouraged yearly), PNA series completed Infectious disease screening: HIV and Hep C completed DEXA: routine screen Patient was encouraged to exercise greater than 150 minutes a week. Patient was encouraged to choose a diet filled with fresh fruits and vegetables, and lean meats. AVS provided to patient today for education/recommendation on gender specific health and safety maintenance. Return in about 1 year (around 10/22/2022) for CPE (30 min).  Orders Placed This Encounter  Procedures   Tdap vaccine greater than or equal to 7yo IM   CBC with Differential/Platelet   Comprehensive metabolic panel   Lipid panel   TSH   Urine Microalbumin w/creat. ratio    Orders Placed This Encounter  Procedures   Tdap vaccine greater than or equal to 7yo IM   CBC with Differential/Platelet   Comprehensive metabolic panel   Lipid panel   TSH   Urine Microalbumin w/creat. ratio   Meds ordered this encounter  Medications   fenofibrate (TRICOR) 145 MG tablet    Sig: Take 1 tablet (145 mg total) by mouth daily.    Dispense:  90 tablet    Refill:  3   atorvastatin (LIPITOR) 10 MG tablet    Sig: Take 1 tablet (10 mg total) by mouth daily.    Dispense:  90 tablet    Refill:  3   Referral Orders  No referral(s) requested today     Electronically signed by: Howard Pouch, [place]

## 2021-10-21 NOTE — Patient Instructions (Signed)
°Great to see you today.  °I have refilled the medication(s) we provide.  ° °If labs were collected, we will inform you of lab results once received either by echart message or telephone call.  ° - echart message- for normal results that have been seen by the patient already.  ° - telephone call: abnormal results or if patient has not viewed results in their echart. ° °Health Maintenance, Female °Adopting a healthy lifestyle and getting preventive care are important in promoting health and wellness. Ask your health care provider about: °The right schedule for you to have regular tests and exams. °Things you can do on your own to prevent diseases and keep yourself healthy. °What should I know about diet, weight, and exercise? °Eat a healthy diet ° °Eat a diet that includes plenty of vegetables, fruits, low-fat dairy products, and lean protein. °Do not eat a lot of foods that are high in solid fats, added sugars, or sodium. °Maintain a healthy weight °Body mass index (BMI) is used to identify weight problems. It estimates body fat based on height and weight. Your health care provider can help determine your BMI and help you achieve or maintain a healthy weight. °Get regular exercise °Get regular exercise. This is one of the most important things you can do for your health. Most adults should: °Exercise for at least 150 minutes each week. The exercise should increase your heart rate and make you sweat (moderate-intensity exercise). °Do strengthening exercises at least twice a week. This is in addition to the moderate-intensity exercise. °Spend less time sitting. Even light physical activity can be beneficial. °Watch cholesterol and blood lipids °Have your blood tested for lipids and cholesterol at 35 years of age, then have this test every 5 years. °Have your cholesterol levels checked more often if: °Your lipid or cholesterol levels are high. °You are older than 35 years of age. °You are at high risk for heart  disease. °What should I know about cancer screening? °Depending on your health history and family history, you may need to have cancer screening at various ages. This may include screening for: °Breast cancer. °Cervical cancer. °Colorectal cancer. °Skin cancer. °Lung cancer. °What should I know about heart disease, diabetes, and high blood pressure? °Blood pressure and heart disease °High blood pressure causes heart disease and increases the risk of stroke. This is more likely to develop in people who have high blood pressure readings or are overweight. °Have your blood pressure checked: °Every 3-5 years if you are 18-39 years of age. °Every year if you are 40 years old or older. °Diabetes °Have regular diabetes screenings. This checks your fasting blood sugar level. Have the screening done: °Once every three years after age 40 if you are at a normal weight and have a low risk for diabetes. °More often and at a younger age if you are overweight or have a high risk for diabetes. °What should I know about preventing infection? °Hepatitis B °If you have a higher risk for hepatitis B, you should be screened for this virus. Talk with your health care provider to find out if you are at risk for hepatitis B infection. °Hepatitis C °Testing is recommended for: °Everyone born from 1945 through 1965. °Anyone with known risk factors for hepatitis C. °Sexually transmitted infections (STIs) °Get screened for STIs, including gonorrhea and chlamydia, if: °You are sexually active and are younger than 35 years of age. °You are older than 35 years of age and your health care provider   tells you that you are at risk for this type of infection. °Your sexual activity has changed since you were last screened, and you are at increased risk for chlamydia or gonorrhea. Ask your health care provider if you are at risk. °Ask your health care provider about whether you are at high risk for HIV. Your health care provider may recommend a  prescription medicine to help prevent HIV infection. If you choose to take medicine to prevent HIV, you should first get tested for HIV. You should then be tested every 3 months for as long as you are taking the medicine. °Pregnancy °If you are about to stop having your period (premenopausal) and you may become pregnant, seek counseling before you get pregnant. °Take 400 to 800 micrograms (mcg) of folic acid every day if you become pregnant. °Ask for birth control (contraception) if you want to prevent pregnancy. °Osteoporosis and menopause °Osteoporosis is a disease in which the bones lose minerals and strength with aging. This can result in bone fractures. If you are 65 years old or older, or if you are at risk for osteoporosis and fractures, ask your health care provider if you should: °Be screened for bone loss. °Take a calcium or vitamin D supplement to lower your risk of fractures. °Be given hormone replacement therapy (HRT) to treat symptoms of menopause. °Follow these instructions at home: °Alcohol use °Do not drink alcohol if: °Your health care provider tells you not to drink. °You are pregnant, may be pregnant, or are planning to become pregnant. °If you drink alcohol: °Limit how much you have to: °0-1 drink a day. °Know how much alcohol is in your drink. In the [place] one drink equals one 12 oz bottle of beer (355 mL), one 5 oz glass of wine (148 mL), or one 1½ oz glass of hard liquor (44 mL). °Lifestyle °Do not use any products that contain nicotine or tobacco. These products include cigarettes, chewing tobacco, and vaping devices, such as e-cigarettes. If you need help quitting, ask your health care provider. °Do not use street drugs. °Do not share needles. °Ask your health care provider for help if you need support or information about quitting drugs. °General instructions °Schedule regular health, dental, and eye exams. °Stay current with your vaccines. °Tell your health care provider if: °You often  feel depressed. °You have ever been abused or do not feel safe at home. °Summary °Adopting a healthy lifestyle and getting preventive care are important in promoting health and wellness. °Follow your health care provider's instructions about healthy diet, exercising, and getting tested or screened for diseases. °Follow your health care provider's instructions on monitoring your cholesterol and blood pressure. °This information is not intended to replace advice given to you by your health care provider. Make sure you discuss any questions you have with your health care provider. °Document Revised: 03/18/2021 Document Reviewed: 03/18/2021 °Elsevier Patient Education © 2022 [employer] Inc. ° °

## 2021-11-25 ENCOUNTER — Other Ambulatory Visit: Payer: Self-pay | Admitting: Family Medicine

## 2021-11-29 ENCOUNTER — Ambulatory Visit: Payer: BC Managed Care – PPO | Admitting: Endocrinology

## 2021-11-29 ENCOUNTER — Other Ambulatory Visit: Payer: Self-pay

## 2021-11-29 VITALS — BP 118/84 | HR 92 | Ht 63.5 in | Wt 150.8 lb

## 2021-11-29 DIAGNOSIS — E1165 Type 2 diabetes mellitus with hyperglycemia: Secondary | ICD-10-CM

## 2021-11-29 DIAGNOSIS — E119 Type 2 diabetes mellitus without complications: Secondary | ICD-10-CM

## 2021-11-29 DIAGNOSIS — E1169 Type 2 diabetes mellitus with other specified complication: Secondary | ICD-10-CM

## 2021-11-29 LAB — POCT GLYCOSYLATED HEMOGLOBIN (HGB A1C): Hemoglobin A1C: 8.3 % — AB (ref 4.0–5.6)

## 2021-11-29 MED ORDER — RYBELSUS 7 MG PO TABS: 7.0000 mg | ORAL_TABLET | Freq: Every day | ORAL | 3 refills | Status: DC

## 2021-11-29 NOTE — Progress Notes (Signed)
Subjective:    Patient ID: Brenda Rosales, female    DOB: 16-Jan-1986, 36 y.o.   MRN: [id number]  HPI Pt returns for f/u of diabetes mellitus: DM type: 2 Dx'ed: 3845 Complications: none Therapy: 4 oral meds GDM: never DKA: never Severe hypoglycemia: never Pancreatitis: never Pancreatic imaging: normal on 2015 CT SDOH: none Other: She did not tolerate Januvia (N/D); she has a Nuva-ring device.   Interval history: Pt says cbg varies from 118-145.  pt states she feels well in general.  She takes meds as rx'ed.   Past Medical History:  Diagnosis Date   Allergy    Anxiety    ASTHMA 11/19/2007   Asthma    CHICKENPOX 11/19/2007   Diabetes mellitus without complication ([place])    diet controlled   Duodenitis    Dysmenorrhea 11/19/2007   DYSPHAGIA 11/19/2010   ECZEMA 11/19/2007   Esophageal stricture    Esophagitis    FATTY LIVER DISEASE 04/17/2008   GERD 11/20/2010   Hyperlipidemia    IBS (irritable bowel syndrome)    Infection of eyelid 08/07/2011   Low vitamin D level 2018   Other and unspecified noninfectious gastroenteritis and colitis(558.9) 02/2014   RHINOSINUSITIS, CHRONIC 12/09/2007   TB SKIN TEST, POSITIVE 11/19/2007    Past Surgical History:  Procedure Laterality Date   CT SINUS LTD W/O CM  09/23/2007   Mole excision     x's 4    Social History   Socioeconomic History   Marital status: Single    Spouse name: Not on file   Number of children: 0   Years of education: Not on file   Highest education level: Not on file  Occupational History   Occupation: Product manager: [employer]  Tobacco Use   Smoking status: Never   Smokeless tobacco: Never  Vaping Use   Vaping Use: Never used  Substance and Sexual Activity   Alcohol use: Yes    Alcohol/week: 1.0 standard drink    Types: 1 Standard drinks or equivalent per week    Comment: SOCIALLY   Drug use: No   Sexual activity: Not Currently    Partners: Male    Birth control/protection:  Inserts    Comment: Nuvaring  Other Topics Concern   Not on file  Social History Narrative   Adopted from [place].    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   Drinks caffeine, uses herbal remedies   Wears her seatbelt, wears bicycle helmet   Reports routine exercise    Smoke detector in the home.   Feels safe in relationships.     Social Determinants of Health   Financial Resource Strain: Not on file  Food Insecurity: Not on file  Transportation Needs: Not on file  Physical Activity: Not on file  Stress: Not on file  Social Connections: Not on file  Intimate Partner Violence: Not on file    Current Outpatient Medications on File Prior to Visit  Medication Sig Dispense Refill   albuterol (VENTOLIN HFA) 108 (90 Base) MCG/ACT inhaler Inhale 2 puffs into the lungs every 6 (six) hours as needed for wheezing or shortness of breath. Refill upon request only. Can be filled with any albuterol on formulary. 8 g 11   atorvastatin (LIPITOR) 10 MG tablet Take 1 tablet (10 mg total) by mouth daily. 90 tablet 3   blood glucose meter kit and supplies Dispense based on patient and insurance preference. Use  up to four times daily as directed. DX E11.9 1 each 11   Blood Glucose Monitoring Suppl (CONTOUR NEXT ONE) KIT See admin instructions.     dapagliflozin propanediol (FARXIGA) 10 MG TABS tablet Take 1 tablet (10 mg total) by mouth daily before breakfast. 90 tablet 3   EPINEPHrine 0.3 mg/0.3 mL IJ SOAJ injection Inject 0.3 mg into the muscle once.     etonogestrel-ethinyl estradiol (NUVARING) 0.12-0.015 MG/24HR vaginal ring Place 1 each vaginally every 28 (twenty-eight) days. Insert vaginally and leave in place for 3 consecutive weeks, then remove for 1 week. 3 each 3   fenofibrate (TRICOR) 145 MG tablet Take 1 tablet (145 mg total) by mouth daily. 90 tablet 3   glipiZIDE (GLUCOTROL) 5 MG tablet Take 0.5 tablets (2.5 mg total) by mouth daily before  breakfast. 45 tablet 3   glucose blood (CONTOUR NEXT TEST) test strip 1 each by Other route daily. And lancets 1/day 100 each 3   hydrOXYzine (ATARAX/VISTARIL) 25 MG tablet TAKE 1 TABLET BY MOUTH EVERYDAY AT BEDTIME 90 tablet 1   levocetirizine (XYZAL) 5 MG tablet TAKE 1 TABLET BY MOUTH EVERY DAY IN THE EVENING 90 tablet 0   metFORMIN (GLUCOPHAGE-XR) 500 MG 24 hr tablet Take 4 tablets (2,000 mg total) by mouth daily. 360 tablet 3   montelukast (SINGULAIR) 10 MG tablet every evening.     SYMBICORT 160-4.5 MCG/ACT inhaler Inhale 2 puffs into the lungs 2 (two) times daily.  3   tacrolimus (PROTOPIC) 0.1 % ointment Apply topically 2 (two) times daily. (Patient taking differently: Apply 1 application topically 2 (two) times daily as needed (for rash, skin issues).) 100 g 1   triamcinolone cream (KENALOG) 0.1 % Apply 1 application topically 2 (two) times daily as needed. 453.6 g 2   No current facility-administered medications on file prior to visit.    Allergies  Allergen Reactions   Vancomycin Other (See Comments)    redmans syndrome    Januvia [Sitagliptin] Other (See Comments)    Dizziness    Family History  Adopted: Yes    BP 118/84    Pulse 92    Ht 5' 3.5" (1.613 m)    Wt 150 lb 12.8 oz (68.4 kg)    SpO2 98%    BMI 26.29 kg/m    Review of Systems She denies hypoglycemia/N/V/HB.      Objective:   Physical Exam    A1c=8.3%    Assessment & Plan:  Type 2 DM: uncontrolled.    Patient Instructions  check your blood sugar once a day.  vary the time of day when you check, between before the 3 meals, and at bedtime.  also check if you have symptoms of your blood sugar being too high or too low.  please keep a record of the readings and bring it to your next appointment here (or you can bring the meter itself).  You can write it on any piece of paper.  please call us sooner if your blood sugar goes below 70, or if most of your readings are over 200.   I have sent a prescription to  your pharmacy, to increase the Rybelsus.   Please continue the same other diabetes medications.   Please come back for a follow-up appointment in 2-3 months.

## 2021-11-29 NOTE — Patient Instructions (Addendum)
check your blood sugar once a day.  vary the time of day when you check, between before the 3 meals, and at bedtime.  also check if you have symptoms of your blood sugar being too high or too low.  please keep a record of the readings and bring it to your next appointment here (or you can bring the meter itself).  You can write it on any piece of paper.  please call us sooner if your blood sugar goes below 70, or if most of your readings are over 200.   I have sent a prescription to your pharmacy, to increase the Rybelsus.   Please continue the same other diabetes medications.   Please come back for a follow-up appointment in 2-3 months.

## 2022-01-30 ENCOUNTER — Other Ambulatory Visit: Payer: Self-pay | Admitting: Family Medicine

## 2022-02-05 NOTE — Progress Notes (Signed)
36 y.o. G0P0000 Single Hispanic female here for annual exam.   ? ?Having some cramping for the first day of her cycle for the last 3 cycles.  ?Ibuprofen helps.  ?No bleeding in between cycles.  ? ?Happy using NuvaRing.  ? ?Notes increased issues with her eczema since starting new job.  ? ?Last A1C 8.3. ? ?Working for [employer].  ? ?PCP:  Howard Pouch, DO ? ?Patient's last menstrual period was 01/19/2022 (exact date).     ?Period Cycle (Days): 30 ?Period Duration (Days): 3-5 ?Period Pattern: Regular ?Menstrual Flow:  (heavy fist 2 days then tapers) ?Menstrual Control: Tampon ?Menstrual Control Change Freq (Hours): changes super tampon every 3-4 hours ?Dysmenorrhea: (!) Moderate ?Dysmenorrhea Symptoms: Cramping, Diarrhea (only day one) ?    ?Sexually active: No.  ?The current method of family planning is NuvaRing vaginal inserts.    ?Exercising: Yes.     Cardio and light weights ?Smoker:  no ? ?Health Maintenance: ?Pap:  02-01-20 Neg:Neg HR HPV, 02-21-16 Neg:Neg HR HPV, 02-08-15 Neg ?History of abnormal Pap:  yes,  abnormal pap 2011 with colposcopy but no treatment to cervix.  ?MMG:  n/a ?Colonoscopy:  04/2008 for abdominal pain with Dr. Perry:normal ?BMD:   n/a  Result  n/a ?TDaP:  02-20-17 ?Gardasil:   yes, completed ?HIV: 02-20-17 NR ?Hep C: 02-20-17 Neg ?Screening Labs:  PCP, endocrinology. ? ? reports that she has never smoked. She has never used smokeless tobacco. She reports that she does not currently use alcohol. She reports that she does not use drugs. ? ?Past Medical History:  ?Diagnosis Date  ? Allergy   ? Anxiety   ? ASTHMA 11/19/2007  ? Asthma   ? CHICKENPOX 11/19/2007  ? Diabetes mellitus without complication ([place])   ? diet controlled  ? Duodenitis   ? Dysmenorrhea 11/19/2007  ? DYSPHAGIA 11/19/2010  ? ECZEMA 11/19/2007  ? Esophageal stricture   ? Esophagitis   ? FATTY LIVER DISEASE 04/17/2008  ? GERD 11/20/2010  ? Hyperlipidemia   ? IBS (irritable bowel syndrome)   ? Infection of eyelid 08/07/2011  ?  Low vitamin D level 2018  ? Other and unspecified noninfectious gastroenteritis and colitis(558.9) 02/2014  ? RHINOSINUSITIS, CHRONIC 12/09/2007  ? TB SKIN TEST, POSITIVE 11/19/2007  ? ? ?Past Surgical History:  ?Procedure Laterality Date  ? CT SINUS LTD W/O CM  09/23/2007  ? Mole excision    ? x's 4  ? ? ?Current Outpatient Medications  ?Medication Sig Dispense Refill  ? albuterol (VENTOLIN HFA) 108 (90 Base) MCG/ACT inhaler Inhale 2 puffs into the lungs every 6 (six) hours as needed for wheezing or shortness of breath. Refill upon request only. Can be filled with any albuterol on formulary. 8 g 11  ? atorvastatin (LIPITOR) 10 MG tablet Take 1 tablet (10 mg total) by mouth daily. 90 tablet 3  ? blood glucose meter kit and supplies Dispense based on patient and insurance preference. Use up to four times daily as directed. DX E11.9 1 each 11  ? Blood Glucose Monitoring Suppl (CONTOUR NEXT ONE) KIT See admin instructions.    ? dapagliflozin propanediol (FARXIGA) 10 MG TABS tablet Take 1 tablet (10 mg total) by mouth daily before breakfast. 90 tablet 3  ? EPINEPHrine 0.3 mg/0.3 mL IJ SOAJ injection Inject 0.3 mg into the muscle once.    ? etonogestrel-ethinyl estradiol (NUVARING) 0.12-0.015 MG/24HR vaginal ring Place 1 each vaginally every 28 (twenty-eight) days. Insert vaginally and leave in place for 3 consecutive  weeks, then remove for 1 week. 3 each 3  ? fenofibrate (TRICOR) 145 MG tablet Take 1 tablet (145 mg total) by mouth daily. 90 tablet 3  ? glipiZIDE (GLUCOTROL) 5 MG tablet Take 0.5 tablets (2.5 mg total) by mouth daily before breakfast. 45 tablet 3  ? glucose blood (CONTOUR NEXT TEST) test strip 1 each by Other route daily. And lancets 1/day 100 each 3  ? hydrOXYzine (ATARAX/VISTARIL) 25 MG tablet TAKE 1 TABLET BY MOUTH EVERYDAY AT BEDTIME 90 tablet 1  ? levocetirizine (XYZAL) 5 MG tablet TAKE 1 TABLET BY MOUTH EVERY DAY IN THE EVENING 90 tablet 0  ? metFORMIN (GLUCOPHAGE-XR) 500 MG 24 hr tablet Take 4  tablets (2,000 mg total) by mouth daily. 360 tablet 3  ? montelukast (SINGULAIR) 10 MG tablet every evening.    ? Semaglutide (RYBELSUS) 7 MG TABS Take 7 mg by mouth daily. 90 tablet 3  ? SYMBICORT 160-4.5 MCG/ACT inhaler Inhale 2 puffs into the lungs 2 (two) times daily.  3  ? tacrolimus (PROTOPIC) 0.1 % ointment Apply topically 2 (two) times daily. (Patient taking differently: Apply 1 application. topically 2 (two) times daily as needed (for rash, skin issues).) 100 g 1  ? ?No current facility-administered medications for this visit.  ? ? ?Family History  ?Adopted: Yes  ? ? ?Review of Systems  ?All other systems reviewed and are negative. ? ?Exam:   ?BP 100/62   Pulse 81   Ht 5' 3.5" (1.613 m)   Wt 155 lb (70.3 kg)   LMP 01/19/2022 (Exact Date)   SpO2 100%   BMI 27.03 kg/m?     ?General appearance: alert, cooperative and appears stated age ?Head: normocephalic, without obvious abnormality, atraumatic ?Neck: no adenopathy, supple, symmetrical, trachea midline and thyroid normal to inspection and palpation ?Lungs: clear to auscultation bilaterally ?Breasts: normal appearance, no masses or tenderness, No nipple retraction or dimpling, No nipple discharge or bleeding, right nipple with hypopigmentation.  No axillary adenopathy ?Heart: regular rate and rhythm ?Abdomen: soft, non-tender; no masses, no organomegaly ?Extremities: extremities normal, atraumatic, no cyanosis or edema ?Skin: skin color, texture, turgor normal. No rashes or lesions ?Lymph nodes: cervical, supraclavicular, and axillary nodes normal. ?Neurologic: grossly normal ? ?Pelvic: External genitalia:  no lesions ?             No abnormal inguinal nodes palpated. ?             Urethra:  normal appearing urethra with no masses, tenderness or lesions ?             Bartholins and Skenes: normal    ?             Vagina: normal appearing vagina with normal color and discharge, no lesions ?             Cervix: no lesions ?             Pap taken:  no ?Bimanual Exam:  Uterus:  normal size, contour, position, consistency, mobility, non-tender ?             Adnexa: no mass, fullness, tenderness ?      ? ?Chaperone was present for exam:  Estill Bamberg, CMA ? ?Assessment:   ?Well woman visit with gynecologic exam. ?Dysmenorrhea. ?Hx LGSIL.  ?Eczema.  ?DM.    ? ?Plan: ?Mammogram screening discussed. ?Self breast awareness reviewed. ?Pap and HR HPV as above. ?Guidelines for Calcium, Vitamin D, regular exercise program including cardiovascular and weight bearing  exercise. ?Refill of Nuvaring.  She will do continuous rings x 3 and then have a menstrual cycle for one week.  Disp:  3, RF 5.  ?She will follow up with dermatology for a skin check.  ?Follow up annually and prn.  ? ?After visit summary provided.  ? ? ? ?

## 2022-02-10 ENCOUNTER — Encounter: Payer: Self-pay | Admitting: Obstetrics and Gynecology

## 2022-02-10 ENCOUNTER — Ambulatory Visit (INDEPENDENT_AMBULATORY_CARE_PROVIDER_SITE_OTHER): Payer: 59 | Admitting: Obstetrics and Gynecology

## 2022-02-10 VITALS — BP 100/62 | HR 81 | Ht 63.5 in | Wt 155.0 lb

## 2022-02-10 DIAGNOSIS — Z01419 Encounter for gynecological examination (general) (routine) without abnormal findings: Secondary | ICD-10-CM | POA: Diagnosis not present

## 2022-02-10 MED ORDER — ETONOGESTREL-ETHINYL ESTRADIOL 0.12-0.015 MG/24HR VA RING: VAGINAL_RING | VAGINAL | 5 refills | Status: DC

## 2022-02-10 NOTE — Patient Instructions (Signed)

## 2022-03-02 ENCOUNTER — Other Ambulatory Visit: Payer: Self-pay | Admitting: Endocrinology

## 2022-03-03 ENCOUNTER — Other Ambulatory Visit (HOSPITAL_COMMUNITY): Payer: Self-pay

## 2022-03-03 ENCOUNTER — Other Ambulatory Visit: Payer: Self-pay | Admitting: Endocrinology

## 2022-03-03 MED ORDER — EMPAGLIFLOZIN 25 MG PO TABS: 25.0000 mg | ORAL_TABLET | Freq: Every day | ORAL | 1 refills | Status: DC

## 2022-03-04 ENCOUNTER — Ambulatory Visit: Payer: BC Managed Care – PPO | Admitting: Endocrinology

## 2022-05-06 ENCOUNTER — Ambulatory Visit: Payer: BC Managed Care – PPO | Admitting: Endocrinology

## 2022-06-24 ENCOUNTER — Ambulatory Visit: Payer: Self-pay | Admitting: Physician Assistant

## 2022-07-13 ENCOUNTER — Other Ambulatory Visit: Payer: Self-pay | Admitting: Endocrinology

## 2022-07-13 DIAGNOSIS — E119 Type 2 diabetes mellitus without complications: Secondary | ICD-10-CM

## 2022-07-18 ENCOUNTER — Other Ambulatory Visit (INDEPENDENT_AMBULATORY_CARE_PROVIDER_SITE_OTHER): Payer: Self-pay

## 2022-07-18 DIAGNOSIS — E119 Type 2 diabetes mellitus without complications: Secondary | ICD-10-CM

## 2022-07-18 LAB — BASIC METABOLIC PANEL
BUN: 16 mg/dL (ref 6–23)
CO2: 25 mEq/L (ref 19–32)
Calcium: 9.5 mg/dL (ref 8.4–10.5)
Chloride: 105 mEq/L (ref 96–112)
Creatinine, Ser: 0.71 mg/dL (ref 0.40–1.20)
GFR: 109.27 mL/min (ref >60.00)
Glucose, Bld: 135 mg/dL — ABNORMAL HIGH (ref 70–99)
Potassium: 3.8 mEq/L (ref 3.5–5.1)
Sodium: 138 mEq/L (ref 135–145)

## 2022-07-18 LAB — HEMOGLOBIN A1C: Hgb A1c MFr Bld: 8.4 % — ABNORMAL HIGH (ref 4.6–6.5)

## 2022-07-23 ENCOUNTER — Ambulatory Visit: Payer: 59 | Admitting: Endocrinology

## 2022-07-23 ENCOUNTER — Encounter: Payer: Self-pay | Admitting: Endocrinology

## 2022-07-23 VITALS — BP 120/62 | HR 70 | Ht 63.5 in | Wt 148.6 lb

## 2022-07-23 DIAGNOSIS — E1165 Type 2 diabetes mellitus with hyperglycemia: Secondary | ICD-10-CM | POA: Diagnosis not present

## 2022-07-23 DIAGNOSIS — E781 Pure hyperglyceridemia: Secondary | ICD-10-CM | POA: Diagnosis not present

## 2022-07-23 MED ORDER — TIRZEPATIDE 5 MG/0.5ML ~~LOC~~ SOAJ: 5.0000 mg | SUBCUTANEOUS | 2 refills | Status: DC

## 2022-07-23 NOTE — Patient Instructions (Addendum)
Stop Glipizide  Start Mounjaro with the pen as shown once weekly on the same day of the week.  You may inject in the stomach, thigh or arm as indicated in the brochure given.  You will feel fullness of the stomach with starting the medication and should try to keep the portions at meals small.  You may experience nausea in the first few days which usually gets better over time   If any questions or concerns are present call the office or the  [facility] at [phone]. Also visit  website for more useful information  Sample of 2.5 for 4 weeks  Then go to 5 mg weekly

## 2022-07-23 NOTE — Progress Notes (Signed)
Patient ID: Brenda Rosales, female   DOB: [birth year], 36 y.o.   MRN: [id number]           Reason for Appointment: Type II Diabetes follow-up   History of Present Illness   Diagnosis date: 2012  Previous history:  Non-insulin hypoglycemic drugs previously used: Metformin glipizide Januvia, Janumet, Rybelsus, Jardiance  A1c range in the last few years is: 6.5-9.1  Recent history:     Non-insulin hypoglycemic drugs: Metformin 2 g daily, glipizide 5 mg daily, Farxiga mg daily, Rybelsus 7 mg daily     Side effects from medications: None  Current self management, blood sugar patterns and problems identified:  A1c is 8.3, previously 8.4 She was last seen in 1/23 At that time Rybelsus was started apparently with 7 mg daily However she now says that she takes it with her morning medications at breakfast and not 30 minutes before as directed She lost her glucose monitor and did not bring this with her She thinks she checks blood sugars after meals is also in the morning However she thinks her blood sugars are not controlled because of not watching her diet or exercising until recently is also getting prednisone a couple of weeks ago from her allergist Now as she is going to the gym in the last 2 weeks is doing walking and kickboxing Also is following a relatively low carbohydrate dairy free diet and she thinks she has lost 10 pounds  Exercise: 3/7 and walking Diet management: Very small meals and protein shakes, no dairy, breakfast usually low carbs      Hypoglycemia:  none    Glucometer:    Contour Blood Glucose readings from meter recall:   PRE-MEAL Fasting Lunch Dinner Bedtime Overall  Glucose range: 120-140      Mean/median:        POST-MEAL PC Breakfast PC Lunch PC Dinner  Glucose range:   150-225  Mean/median:       Dietician visit: Most recent:      Weight control: Max weight 170lbs  Wt Readings from Last 3 Encounters:  07/23/22 148 lb 9.6 oz (67.4 kg)  02/10/22  155 lb (70.3 kg)  11/29/21 150 lb 12.8 oz (68.4 kg)            Diabetes labs:  Lab Results  Component Value Date   HGBA1C 8.4 (H) 07/18/2022   HGBA1C 8.3 (A) 11/29/2021   HGBA1C 6.8 (A) 08/28/2021   Lab Results  Component Value Date   MICROALBUR <0.7 10/21/2021   LDLCALC 82 10/21/2021   CREATININE 0.71 07/18/2022    No results found for: "FRUCTOSAMINE"   Allergies as of 07/23/2022       Reactions   Vancomycin Other (See Comments)   redmans syndrome    Januvia [sitagliptin] Other (See Comments)   Dizziness        Medication List        Accurate as of July 23, 2022 10:33 AM. If you have any questions, ask your nurse or doctor.          albuterol 108 (90 Base) MCG/ACT inhaler Commonly known as: VENTOLIN HFA Inhale 2 puffs into the lungs every 6 (six) hours as needed for wheezing or shortness of breath. Refill upon request only. Can be filled with any albuterol on formulary.   atorvastatin 10 MG tablet Commonly known as: LIPITOR Take 1 tablet (10 mg total) by mouth daily.   blood glucose meter kit and supplies Dispense based on patient and insurance preference.  Use up to four times daily as directed. DX E11.9   Contour Next One Kit See admin instructions.   Contour Next Test test strip Generic drug: glucose blood 1 EACH BY OTHER ROUTE DAILY. AND LANCETS 1/DAY   empagliflozin 25 MG Tabs tablet Commonly known as: Jardiance Take 1 tablet (25 mg total) by mouth daily before breakfast.   EPINEPHrine 0.3 mg/0.3 mL Soaj injection Commonly known as: EPI-PEN Inject 0.3 mg into the muscle once.   etonogestrel-ethinyl estradiol 0.12-0.015 MG/24HR vaginal ring Commonly known as: NuvaRing Insert vaginally and leave in place for 3 consecutive weeks, then remove and place a new ring for 3 weeks.  Repeat one additional time and then remove third ring and have a menstrual period.   fenofibrate 145 MG tablet Commonly known as: TRICOR Take 1 tablet (145 mg  total) by mouth daily.   glipiZIDE 5 MG tablet Commonly known as: GLUCOTROL Take 0.5 tablets (2.5 mg total) by mouth daily before breakfast.   hydrOXYzine 25 MG tablet Commonly known as: ATARAX TAKE 1 TABLET BY MOUTH EVERYDAY AT BEDTIME   levocetirizine 5 MG tablet Commonly known as: XYZAL TAKE 1 TABLET BY MOUTH EVERY DAY IN THE EVENING   metFORMIN 500 MG 24 hr tablet Commonly known as: GLUCOPHAGE-XR TAKE 4 TABLETS (2,000 MG TOTAL) BY MOUTH DAILY.   montelukast 10 MG tablet Commonly known as: SINGULAIR every evening.   Rybelsus 7 MG Tabs Generic drug: Semaglutide Take 7 mg by mouth daily.   Symbicort 160-4.5 MCG/ACT inhaler Generic drug: budesonide-formoterol Inhale 2 puffs into the lungs 2 (two) times daily.   tacrolimus 0.1 % ointment Commonly known as: Protopic Apply topically 2 (two) times daily. What changed:  how much to take when to take this reasons to take this        Allergies:  Allergies  Allergen Reactions   Vancomycin Other (See Comments)    redmans syndrome    Januvia [Sitagliptin] Other (See Comments)    Dizziness    Past Medical History:  Diagnosis Date   Allergy    Anxiety    ASTHMA 11/19/2007   Asthma    CHICKENPOX 11/19/2007   Diabetes mellitus without complication ([place])    diet controlled   Duodenitis    Dysmenorrhea 11/19/2007   DYSPHAGIA 11/19/2010   ECZEMA 11/19/2007   Esophageal stricture    Esophagitis    FATTY LIVER DISEASE 04/17/2008   GERD 11/20/2010   Hyperlipidemia    IBS (irritable bowel syndrome)    Infection of eyelid 08/07/2011   Low vitamin D level 2018   Other and unspecified noninfectious gastroenteritis and colitis(558.9) 02/2014   RHINOSINUSITIS, CHRONIC 12/09/2007   TB SKIN TEST, POSITIVE 11/19/2007    Past Surgical History:  Procedure Laterality Date   CT SINUS LTD W/O CM  09/23/2007   Mole excision     x's 4    Family History  Adopted: Yes    Social History:  reports that she has never  smoked. She has never used smokeless tobacco. She reports that she does not currently use alcohol. She reports that she does not use drugs.  Review of Systems:  Last diabetic eye exam date 6/21  Last urine microalbumin date: 12/22  Last foot exam date: 10/22  Symptoms of neuropathy: None  Hypertension: Not present   BP Readings from Last 3 Encounters:  07/23/22 120/62  02/10/22 100/62  11/29/21 118/84    Lipid management: She ran out of Lipitor because she has not been back  to her PCP    Lab Results  Component Value Date   CHOL 172 10/21/2021   CHOL 152 03/07/2021   CHOL 263 (H) 12/10/2020   Lab Results  Component Value Date   HDL 55.20 10/21/2021   HDL 39.50 03/07/2021   HDL 50 12/10/2020   Lab Results  Component Value Date   LDLCALC 82 10/21/2021   LDLCALC 77 03/07/2021   LDLCALC 167 (H) 12/10/2020   Lab Results  Component Value Date   TRIG 176.0 (H) 10/21/2021   TRIG 174.0 (H) 03/07/2021   TRIG 279 (H) 12/10/2020   Lab Results  Component Value Date   CHOLHDL 3 10/21/2021   CHOLHDL 4 03/07/2021   CHOLHDL 5.3 (H) 12/10/2020   Lab Results  Component Value Date   LDLDIRECT 142.0 04/27/2019    Unable to calculate Fibrosis 4 Score. Requires ALT, AST, and platelet count within the last 6 months.      Examination:   BP 120/62   Pulse 70   Ht 5' 3.5" (1.613 m)   Wt 148 lb 9.6 oz (67.4 kg)   SpO2 99%   BMI 25.91 kg/m   Body mass index is 25.91 kg/m.    ASSESSMENT/ PLAN:    Diabetes type 2:   Current regimen: Farxiga, glipizide, metformin, Rybelsus  See history of present illness for detailed discussion of current diabetes management, blood sugar patterns and problems identified  A1c is 8.4  Although she is on a multiple drug regimen and not improved since her last visit she is likely not getting much benefit from Rybelsus because of taking it with food She reports much better blood sugar readings in the last couple of weeks and she has  been on a low carbohydrate diet as well as regular exercise regimen and recent weight loss Also discussed that she is unlikely benefiting much from a low-dose regular glipizide in the morning  Recommendations:  She will be given a new glucose monitor to restart checking blood sugars regularly Discussed blood sugar targets both fasting and after meals She will switch from Rybelsus to [facility] since this will be much more convenient and simpler for her to take a she prefers not to take her medication 30 minutes before breakfast Discussed with the patient the action of GIP/GLP-1 drugs, the effects on pancreatic and liver function, effects on brain and stomach with improved satiety, slowing gastric emptying, improving satiety and reducing liver glucose output.  Discussed the effects on promoting weight loss. Explained possible side effects of MOUNJARO, most commonly nausea that usually improves over time; discussed safety information in package insert.  Demonstrated the medication injection device and injection technique to the patient.  Showed patient the injection sites for his medication To start with 2.5 mg dosage weekly for the first 4 injections and then increase the dose to 5 mg weekly Sample of 2.5 mg device given and she will use the written prescription for 5 mg after 4 weeks if tolerated well Patient brochure on Mounjaro and explained use of the available co-pay card  Continue metformin and [facility] but may consider combining to [facility] and she will let us know if she wants to do this Continue excellent regimen of exercise and calorie reduced diet  Hyperlipidemia: She will follow-up with her PCP for renewing her medications and following up on her labs  Recheck A1c in about 3 months  Total visit time for evaluation and management, review of relevant records and labs and counseling = 30 minutes  There are no Patient Instructions on file for this visit.   Elayne Snare 07/23/2022, 10:33 AM

## 2022-10-14 ENCOUNTER — Other Ambulatory Visit (INDEPENDENT_AMBULATORY_CARE_PROVIDER_SITE_OTHER): Payer: 59

## 2022-10-14 DIAGNOSIS — E781 Pure hyperglyceridemia: Secondary | ICD-10-CM | POA: Diagnosis not present

## 2022-10-14 DIAGNOSIS — E1165 Type 2 diabetes mellitus with hyperglycemia: Secondary | ICD-10-CM

## 2022-10-14 LAB — BASIC METABOLIC PANEL
BUN: 12 mg/dL (ref 6–23)
CO2: 26 mEq/L (ref 19–32)
Calcium: 9.6 mg/dL (ref 8.4–10.5)
Chloride: 104 mEq/L (ref 96–112)
Creatinine, Ser: 0.7 mg/dL (ref 0.40–1.20)
GFR: 110.96 mL/min (ref >60.00)
Glucose, Bld: 144 mg/dL — ABNORMAL HIGH (ref 70–99)
Potassium: 4 mEq/L (ref 3.5–5.1)
Sodium: 139 mEq/L (ref 135–145)

## 2022-10-14 LAB — LIPID PANEL
Cholesterol: 165 mg/dL (ref 0–200)
HDL: 51.2 mg/dL (ref >39.00)
LDL Cholesterol: 96 mg/dL (ref 0–99)
NonHDL: 114.03
Total CHOL/HDL Ratio: 3
Triglycerides: 91 mg/dL (ref 0.0–149.0)
VLDL: 18.2 mg/dL (ref 0.0–40.0)

## 2022-10-14 LAB — HEMOGLOBIN A1C: Hgb A1c MFr Bld: 7.1 % — ABNORMAL HIGH (ref 4.6–6.5)

## 2022-10-17 ENCOUNTER — Encounter (HOSPITAL_COMMUNITY): Payer: Self-pay

## 2022-10-17 ENCOUNTER — Other Ambulatory Visit: Payer: Self-pay

## 2022-10-17 ENCOUNTER — Emergency Department (HOSPITAL_COMMUNITY)
Admission: EM | Admit: 2022-10-17 | Discharge: 2022-10-18 | Disposition: A | Discharge status: Home / Self Care | Payer: 59 | Attending: Emergency Medicine | Admitting: Emergency Medicine

## 2022-10-17 DIAGNOSIS — M25512 Pain in left shoulder: Secondary | ICD-10-CM | POA: Insufficient documentation

## 2022-10-17 DIAGNOSIS — Y92410 Unspecified street and highway as the place of occurrence of the external cause: Secondary | ICD-10-CM | POA: Insufficient documentation

## 2022-10-17 DIAGNOSIS — E119 Type 2 diabetes mellitus without complications: Secondary | ICD-10-CM | POA: Insufficient documentation

## 2022-10-17 DIAGNOSIS — M7918 Myalgia, other site: Secondary | ICD-10-CM

## 2022-10-17 DIAGNOSIS — S20212A Contusion of left front wall of thorax, initial encounter: Secondary | ICD-10-CM | POA: Diagnosis not present

## 2022-10-17 DIAGNOSIS — Z7984 Long term (current) use of oral hypoglycemic drugs: Secondary | ICD-10-CM | POA: Diagnosis not present

## 2022-10-17 DIAGNOSIS — S299XXA Unspecified injury of thorax, initial encounter: Secondary | ICD-10-CM | POA: Diagnosis present
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
## 2022-10-18 ENCOUNTER — Emergency Department (HOSPITAL_COMMUNITY): Payer: 59

## 2022-10-18 MED ORDER — NAPROXEN 500 MG PO TABS: 500.0000 mg | ORAL_TABLET | Freq: Two times a day (BID) | ORAL | 0 refills | Status: DC | PRN

## 2022-10-18 MED ORDER — METHOCARBAMOL 500 MG PO TABS: 500.0000 mg | ORAL_TABLET | Freq: Two times a day (BID) | ORAL | 0 refills | Status: DC | PRN

## 2022-10-18 NOTE — Discharge Instructions (Signed)
Alternate ice and heat to areas of injury 3-4 times per day to limit inflammation and spasm.  Avoid strenuous activity and heavy lifting.  We recommend consistent use of naproxen in addition to Robaxin for muscle spasms. Do not drive or drink alcohol after taking Robaxin as it may make you drowsy and impair your judgment.  We recommend follow-up with a primary care doctor to ensure resolution of symptoms.  Return to the ED for any new or concerning symptoms. 
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
## 2022-10-20 NOTE — Progress Notes (Signed)
Patient ID: Brenda Rosales, female   DOB: 07-19-1986, 36 y.o.   MRN: [id number]           Reason for Appointment: Type II Diabetes follow-up   History of Present Illness   Diagnosis date: 2012  Previous history:  Non-insulin hypoglycemic drugs previously used: Metformin glipizide Januvia, Janumet, Rybelsus, Jardiance  A1c range in the last few years is: 6.5-9.1  Recent history:     Non-insulin hypoglycemic drugs: Metformin 2 g daily, glipizide 5 mg daily, Farxiga mg daily, Rybelsus 7 mg daily     Side effects from medications: None  Current self management, blood sugar patterns and problems identified:  A1c is 8.3, previously 8.4 She was last seen in 1/23 At that time Rybelsus was started apparently with 7 mg daily However she now says that she takes it with her morning medications at breakfast and not 30 minutes before as directed She lost her glucose monitor and did not bring this with her She thinks she checks blood sugars after meals is also in the morning However she thinks her blood sugars are not controlled because of not watching her diet or exercising until recently is also getting prednisone a couple of weeks ago from her allergist Now as she is going to the gym in the last 2 weeks is doing walking and kickboxing Also is following a relatively low carbohydrate dairy free diet and she thinks she has lost 10 pounds  Exercise: 3/7 and walking Diet management: Very small meals and protein shakes, no dairy, breakfast usually low carbs      Hypoglycemia:  none    Glucometer:    Contour Blood Glucose readings from meter recall:   PRE-MEAL Fasting Lunch Dinner Bedtime Overall  Glucose range: 120-140      Mean/median:        POST-MEAL PC Breakfast PC Lunch PC Dinner  Glucose range:   150-225  Mean/median:       Dietician visit: Most recent:      Weight control: Max weight 170lbs  Wt Readings from Last 3 Encounters:  07/23/22 148 lb 9.6 oz (67.4 kg)  02/10/22  155 lb (70.3 kg)  11/29/21 150 lb 12.8 oz (68.4 kg)            Diabetes labs:  Lab Results  Component Value Date   HGBA1C 7.1 (H) 10/14/2022   HGBA1C 8.4 (H) 07/18/2022   HGBA1C 8.3 (A) 11/29/2021   Lab Results  Component Value Date   MICROALBUR <0.7 10/21/2021   LDLCALC 96 10/14/2022   CREATININE 0.70 10/14/2022    No results found for: "FRUCTOSAMINE"   Allergies as of 10/21/2022       Reactions   Vancomycin Other (See Comments)   redmans syndrome    Januvia [sitagliptin] Other (See Comments)   Dizziness        Medication List        Accurate as of October 20, 2022  4:31 PM. If you have any questions, ask your nurse or doctor.          albuterol 108 (90 Base) MCG/ACT inhaler Commonly known as: VENTOLIN HFA Inhale 2 puffs into the lungs every 6 (six) hours as needed for wheezing or shortness of breath. Refill upon request only. Can be filled with any albuterol on formulary.   atorvastatin 10 MG tablet Commonly known as: LIPITOR Take 1 tablet (10 mg total) by mouth daily.   blood glucose meter kit and supplies Dispense based on patient and insurance  preference. Use up to four times daily as directed. DX E11.9   Contour Next One Kit See admin instructions.   Contour Next Test test strip Generic drug: glucose blood 1 EACH BY OTHER ROUTE DAILY. AND LANCETS 1/DAY   EPINEPHrine 0.3 mg/0.3 mL Soaj injection Commonly known as: EPI-PEN Inject 0.3 mg into the muscle once.   etonogestrel-ethinyl estradiol 0.12-0.015 MG/24HR vaginal ring Commonly known as: NuvaRing Insert vaginally and leave in place for 3 consecutive weeks, then remove and place a new ring for 3 weeks.  Repeat one additional time and then remove third ring and have a menstrual period.   FARXIGA PO Take by mouth.   fenofibrate 145 MG tablet Commonly known as: TRICOR Take 1 tablet (145 mg total) by mouth daily.   hydrOXYzine 25 MG tablet Commonly known as: ATARAX TAKE 1 TABLET BY  MOUTH EVERYDAY AT BEDTIME   levocetirizine 5 MG tablet Commonly known as: XYZAL TAKE 1 TABLET BY MOUTH EVERY DAY IN THE EVENING   metFORMIN 500 MG 24 hr tablet Commonly known as: GLUCOPHAGE-XR TAKE 4 TABLETS (2,000 MG TOTAL) BY MOUTH DAILY.   methocarbamol 500 MG tablet Commonly known as: ROBAXIN Take 1 tablet (500 mg total) by mouth every 12 (twelve) hours as needed for muscle spasms.   montelukast 10 MG tablet Commonly known as: SINGULAIR every evening.   naproxen 500 MG tablet Commonly known as: NAPROSYN Take 1 tablet (500 mg total) by mouth every 12 (twelve) hours as needed for moderate pain or mild pain.   Symbicort 160-4.5 MCG/ACT inhaler Generic drug: budesonide-formoterol Inhale 2 puffs into the lungs 2 (two) times daily.   tacrolimus 0.1 % ointment Commonly known as: Protopic Apply topically 2 (two) times daily. What changed:  how much to take when to take this reasons to take this   tirzepatide 5 MG/0.5ML Pen Commonly known as: MOUNJARO Inject 5 mg into the skin once a week.        Allergies:  Allergies  Allergen Reactions   Vancomycin Other (See Comments)    redmans syndrome    Januvia [Sitagliptin] Other (See Comments)    Dizziness    Past Medical History:  Diagnosis Date   Allergy    Anxiety    ASTHMA 11/19/2007   Asthma    CHICKENPOX 11/19/2007   Diabetes mellitus without complication ([place])    diet controlled   Duodenitis    Dysmenorrhea 11/19/2007   DYSPHAGIA 11/19/2010   ECZEMA 11/19/2007   Esophageal stricture    Esophagitis    FATTY LIVER DISEASE 04/17/2008   GERD 11/20/2010   Hyperlipidemia    IBS (irritable bowel syndrome)    Infection of eyelid 08/07/2011   Low vitamin D level 2018   Other and unspecified noninfectious gastroenteritis and colitis(558.9) 02/2014   RHINOSINUSITIS, CHRONIC 12/09/2007   TB SKIN TEST, POSITIVE 11/19/2007    Past Surgical History:  Procedure Laterality Date   CT SINUS LTD W/O CM  09/23/2007    Mole excision     x's 4    Family History  Adopted: Yes    Social History:  reports that she has never smoked. She has never used smokeless tobacco. She reports that she does not currently use alcohol. She reports that she does not use drugs.  Review of Systems:  Last diabetic eye exam date 6/21  Last urine microalbumin date: 12/22  Last foot exam date: 10/22  Symptoms of neuropathy: None  Hypertension: Not present   BP Readings from Last 3  Encounters:  10/17/22 115/72  07/23/22 120/62  02/10/22 100/62    Lipid management: She ran out of Lipitor because she has not been back to her PCP    Lab Results  Component Value Date   CHOL 165 10/14/2022   CHOL 172 10/21/2021   CHOL 152 03/07/2021   Lab Results  Component Value Date   HDL 51.20 10/14/2022   HDL 55.20 10/21/2021   HDL 39.50 03/07/2021   Lab Results  Component Value Date   LDLCALC 96 10/14/2022   LDLCALC 82 10/21/2021   LDLCALC 77 03/07/2021   Lab Results  Component Value Date   TRIG 91.0 10/14/2022   TRIG 176.0 (H) 10/21/2021   TRIG 174.0 (H) 03/07/2021   Lab Results  Component Value Date   CHOLHDL 3 10/14/2022   CHOLHDL 3 10/21/2021   CHOLHDL 4 03/07/2021   Lab Results  Component Value Date   LDLDIRECT 142.0 04/27/2019    Unable to calculate Fibrosis 4 Score. Requires ALT, AST, and platelet count within the last 6 months.      Examination:   There were no vitals taken for this visit.  There is no height or weight on file to calculate BMI.    ASSESSMENT/ PLAN:    Diabetes type 2:   Current regimen: Farxiga, glipizide, metformin, Rybelsus  See history of present illness for detailed discussion of current diabetes management, blood sugar patterns and problems identified  A1c is 8.4  Although she is on a multiple drug regimen and not improved since her last visit she is likely not getting much benefit from Rybelsus because of taking it with food She reports much better blood  sugar readings in the last couple of weeks and she has been on a low carbohydrate diet as well as regular exercise regimen and recent weight loss Also discussed that she is unlikely benefiting much from a low-dose regular glipizide in the morning  Recommendations:  She will be given a new glucose monitor to restart checking blood sugars regularly Discussed blood sugar targets both fasting and after meals She will switch from Rybelsus to [facility] since this will be much more convenient and simpler for her to take a she prefers not to take her medication 30 minutes before breakfast Discussed with the patient the action of GIP/GLP-1 drugs, the effects on pancreatic and liver function, effects on brain and stomach with improved satiety, slowing gastric emptying, improving satiety and reducing liver glucose output.  Discussed the effects on promoting weight loss. Explained possible side effects of MOUNJARO, most commonly nausea that usually improves over time; discussed safety information in package insert.  Demonstrated the medication injection device and injection technique to the patient.  Showed patient the injection sites for his medication To start with 2.5 mg dosage weekly for the first 4 injections and then increase the dose to 5 mg weekly Sample of 2.5 mg device given and she will use the written prescription for 5 mg after 4 weeks if tolerated well Patient brochure on Mounjaro and explained use of the available co-pay card  Continue metformin and [facility] [place] but may consider combining to [facility] and she will let us know if she wants to do this Continue excellent regimen of exercise and calorie reduced diet  Hyperlipidemia: She will follow-up with her PCP for renewing her medications and following up on her labs    There are no Patient Instructions on file for this visit.   Elayne Snare 10/20/2022, 4:31 PM

## 2022-10-21 ENCOUNTER — Ambulatory Visit: Payer: 59 | Admitting: Endocrinology

## 2022-10-21 ENCOUNTER — Encounter: Payer: BC Managed Care – PPO | Admitting: Family Medicine

## 2022-10-21 ENCOUNTER — Encounter: Payer: Self-pay | Admitting: Endocrinology

## 2022-10-21 ENCOUNTER — Other Ambulatory Visit: Payer: Self-pay | Admitting: Endocrinology

## 2022-10-21 VITALS — BP 106/60 | HR 67 | Ht 63.0 in | Wt 147.0 lb

## 2022-10-21 DIAGNOSIS — E1165 Type 2 diabetes mellitus with hyperglycemia: Secondary | ICD-10-CM

## 2022-10-21 DIAGNOSIS — E782 Mixed hyperlipidemia: Secondary | ICD-10-CM

## 2022-10-21 MED ORDER — TIRZEPATIDE 5 MG/0.5ML ~~LOC~~ SOAJ: 5.0000 mg | SUBCUTANEOUS | 0 refills | Status: DC

## 2022-10-21 MED ORDER — XIGDUO XR 5-1000 MG PO TB24: 2.0000 | ORAL_TABLET | Freq: Every day | ORAL | 1 refills | Status: DC

## 2022-10-21 MED ORDER — TIRZEPATIDE 2.5 MG/0.5ML ~~LOC~~ SOAJ: 2.5000 mg | SUBCUTANEOUS | 0 refills | Status: DC

## 2022-10-22 ENCOUNTER — Encounter: Payer: Self-pay | Admitting: Family Medicine

## 2022-10-22 ENCOUNTER — Ambulatory Visit: Payer: 59 | Admitting: Family Medicine

## 2022-10-22 VITALS — BP 97/63 | HR 74 | Temp 97.8°F | Ht 63.0 in | Wt 148.0 lb

## 2022-10-22 DIAGNOSIS — G44311 Acute post-traumatic headache, intractable: Secondary | ICD-10-CM | POA: Diagnosis not present

## 2022-10-22 DIAGNOSIS — T148XXA Other injury of unspecified body region, initial encounter: Secondary | ICD-10-CM

## 2022-10-22 NOTE — Patient Instructions (Signed)
Head Injury, Adult There are many types of head injuries. They can be as minor as a small bump. Some head injuries can be worse. Worse injuries include: A strong hit to the head that shakes the brain back and forth, causing damage (concussion). A bruise (contusion) of the brain. This means there is bleeding in the brain that can cause swelling. A cracked skull (skull fracture). Bleeding in the brain that gathers, gets thick (makes a clot), and forms a bump (hematoma). Most problems from a head injury come in the first 24 hours. However, you may still have side effects up to 7-10 days after your injury. It is important to watch your condition for any changes. You may need to be watched in the emergency department or urgent care, or you may need to stay in the hospital. What are the causes? There are many possible causes of a head injury. A serious head injury may be caused by: A car accident. Bicycle or motorcycle accidents. Sports injuries. Falls. Being hit by an object. What are the signs or symptoms? Symptoms of a head injury include a bruise, bump, or bleeding where the injury happened. Other physical symptoms may include: Headache. Feeling like you may vomit (nauseous) or vomiting. Dizziness. Blurred or double vision. Being uncomfortable around bright lights or loud noises. Shaking movements that you cannot control (seizures). Feeling tired. Trouble being woken up. Fainting or loss of consciousness. Mental or emotional symptoms may include: Feeling grumpy or cranky. Confusion and memory problems. Having trouble paying attention or concentrating. Changes in eating or sleeping habits. Feeling worried or nervous (anxious). Feeling sad (depressed). How is this treated? Treatment for this condition depends on how severe the injury is and the type of injury you have. The main goal is to prevent problems and to allow the brain time to heal. Mild head injury If you have a mild head  injury, you may be sent home, and treatment may include: Being watched. A responsible adult should stay with you for 24 hours after your injury and check on you often. Physical rest. Brain rest. Pain medicines. Severe head injury If you have a severe head injury, treatment may include: Being watched closely. This includes staying in the hospital. Medicines to: Help with pain. Prevent seizures. Help with brain swelling. Protecting your airway and using a machine that helps you breathe (ventilator). Treatments to watch for and manage swelling inside the brain. Brain surgery. This may be needed to: Remove a collection of blood or blood clots. Stop the bleeding. Remove a part of the skull. This allows room for the brain to swell. Follow these instructions at home: Activity Rest. Avoid activities that are hard or tiring. Make sure you get enough sleep. Let your brain rest. Do this by limiting activities that need a lot of thought or attention, such as: Watching TV. Playing memory games and puzzles. Job-related work or homework. Working on [employer], [employer], and texting. Avoid activities that could cause another head injury until your doctor says it is okay. This includes playing sports. Having another head injury, especially before the first one has healed, can be dangerous. Ask your doctor when it is safe for you to go back to your normal activities, such as work or school. Ask your doctor for a step-by-step plan for slowly going back to your normal activities. Ask your doctor when you can drive, ride a bicycle, or use heavy machinery. Do not do these activities if you are dizzy. Lifestyle  Do  not drink alcohol until your doctor says it is okay. Do not use drugs. If it is harder than usual to remember things, write them down. If you are easily distracted, try to do one thing at a time. Talk with family members or close friends when making important decisions. Tell your  friends, family, a trusted co-worker, and work Fre[employer] about your injury, symptoms, and limits (restrictions). Have them watch for any problems that are new or getting worse. General instructions Take over-the-counter and prescription medicines only as told by your doctor. Have someone stay with you for 24 hours after your head injury. This person should watch you for any changes in your symptoms and be ready to get help. Keep all follow-up visits as told by your doctor. This is important. How is this prevented? Work on [employer]. This can help you avoid falls. Wear a seat belt when you are in a moving vehicle. Wear a helmet when you: Ride a bicycle. Ski. Do any other sport or activity that has a risk of injury. If you drink alcohol: Limit how much you use to: 0-1 drink a day for nonpregnant women. 0-2 drinks a day for men. Be aware of how much alcohol is in your drink. In the [place] one drink equals one 12 oz bottle of beer (355 mL), one 5 oz glass of wine (148 mL), or one 1 oz glass of hard liquor (44 mL). Make your home safer by: Getting rid of clutter from the floors and stairs. This includes things that can make you trip. Using grab bars in bathrooms and handrails by stairs. Placing non-slip mats on floors and in bathtubs. Putting more light in dim areas. Where to find more information Centers for Disease Control and Prevention: http://www.wolf.info/ Get help right away if: You have: A very bad headache that is not helped by medicine. Trouble walking or weakness in your arms and legs. Clear or bloody fluid coming from your nose or ears. Changes in how you see (vision). A seizure. More confusion or more grumpy moods. Your symptoms get worse. You are sleepier than normal and have trouble staying awake. You lose your balance. The black centers of your eyes (pupils) change in size. Your speech is slurred. Your dizziness gets worse. You vomit. These symptoms may be an  emergency. Do not wait to see if the symptoms will go away. Get medical help right away. Call your local emergency services (911 in the [place] Do not drive yourself to the hospital. Summary Head injuries can be as minor as a small bump. Some head injuries can be worse. Treatment for this condition depends on how severe the injury is and the type of injury you have. Have someone stay with you for 24 hours after your head injury. Ask your doctor when it is safe for you to go back to your normal activities, such as work or school. To prevent a head injury, wear a seat belt in a car, wear a helmet when you use a bicycle, limit your alcohol use, and make your home safer. This information is not intended to replace advice given to you by your health care provider. Make sure you discuss any questions you have with your health care provider. Document Revised: 09/09/2019 Document Reviewed: 09/09/2019 Elsevier Patient Education  [place].
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
## 2022-10-23 ENCOUNTER — Other Ambulatory Visit (HOSPITAL_COMMUNITY): Payer: Self-pay

## 2022-10-23 ENCOUNTER — Telehealth: Payer: Self-pay

## 2022-10-23 DIAGNOSIS — G44311 Acute post-traumatic headache, intractable: Secondary | ICD-10-CM | POA: Diagnosis not present

## 2022-10-23 MED ORDER — KETOROLAC TROMETHAMINE 30 MG/ML IJ SOLN
30.0000 mg | Freq: Once | INTRAMUSCULAR | Status: AC
  Administered 2022-10-23: 30 mg via INTRAVENOUS

## 2022-10-23 NOTE — Telephone Encounter (Signed)
Pharmacy Patient Advocate Encounter    Please advise if suggested medication is appropriate. A message has been sent to the provider.      After running a test claim. The patient insurance suggested ''invokamet'' as an alternative. However, after running invokamet as a test claim, the insurance is suggesting an alternative which is (Synjardy or Synjardy XR . Which is at a cost of 75.00. A coupon should also be available for this medication as well to reduce the cost. If this medication is appropriate are you willing to send this in as an alternative?

## 2022-10-23 NOTE — Addendum Note (Signed)
Addended by: Kavin Leech on: 10/23/2022 08:36 AM   Modules accepted: Orders

## 2022-10-26 ENCOUNTER — Other Ambulatory Visit: Payer: Self-pay | Admitting: Endocrinology

## 2022-10-26 MED ORDER — SYNJARDY XR 12.5-1000 MG PO TB24: 2.0000 | ORAL_TABLET | Freq: Every day | ORAL | 5 refills | Status: DC

## 2022-10-31 ENCOUNTER — Ambulatory Visit (HOSPITAL_BASED_OUTPATIENT_CLINIC_OR_DEPARTMENT_OTHER)
Admission: RE | Admit: 2022-10-31 | Discharge: 2022-10-31 | Disposition: A | Discharge status: Home / Self Care | Payer: 59 | Source: Ambulatory Visit | Attending: Family Medicine | Admitting: Family Medicine

## 2022-10-31 DIAGNOSIS — G44311 Acute post-traumatic headache, intractable: Secondary | ICD-10-CM | POA: Insufficient documentation

## 2022-11-06 ENCOUNTER — Telehealth: Payer: Self-pay | Admitting: Family Medicine

## 2022-11-06 DIAGNOSIS — I7090 Unspecified atherosclerosis: Secondary | ICD-10-CM | POA: Insufficient documentation

## 2022-11-06 DIAGNOSIS — J341 Cyst and mucocele of nose and nasal sinus: Secondary | ICD-10-CM

## 2022-11-06 NOTE — Telephone Encounter (Signed)
Please inform patient, her CT head showed no evidence of acute intracranial abnormality. She does have some evidence of calcification/cholesterol in her right internal carotid artery.  We had started her on Lipitor low-dose last year to provide her cardiovascular protection, it appears she reported not taking this medication.  That medication would be indicated more so now that there is evidence of atherosclerotic calcification/cholesterol in her carotid artery.  If she would like to restart the Lipitor 10 mg nightly please refill this for her.   CT also resulted with rather significant sinus disease with thickening of her sinus cavities as well as mucous retention cysts. Would encourage her to continue her allergy regimen daily.  If she is not already established with an ENT, would encourage her to become established with ENT and see if they have any more to offer considering the severity of her sinus conditions.  If needing referral please place referral to ENT for chronic sinusitis by CT Thank you

## 2022-11-06 NOTE — Telephone Encounter (Signed)
LVM for pt to CB regarding results.  

## 2022-11-07 NOTE — Telephone Encounter (Signed)
LVM for pt to CB regarding results.  

## 2022-11-11 MED ORDER — ATORVASTATIN CALCIUM 10 MG PO TABS: 10.0000 mg | ORAL_TABLET | Freq: Every day | ORAL | 1 refills | Status: DC

## 2022-11-11 NOTE — Addendum Note (Signed)
Addended by: Kavin Leech on: 11/11/2022 09:27 AM   Modules accepted: Orders

## 2022-11-11 NOTE — Telephone Encounter (Signed)
Spoke with pt regarding labs and instructions.   

## 2022-11-12 ENCOUNTER — Encounter: Payer: Self-pay | Admitting: Family Medicine

## 2022-11-12 ENCOUNTER — Ambulatory Visit (INDEPENDENT_AMBULATORY_CARE_PROVIDER_SITE_OTHER): Payer: 59 | Admitting: Family Medicine

## 2022-11-12 VITALS — BP 96/66 | HR 70 | Temp 98.7°F | Ht 63.98 in | Wt 144.0 lb

## 2022-11-12 DIAGNOSIS — E1169 Type 2 diabetes mellitus with other specified complication: Secondary | ICD-10-CM

## 2022-11-12 DIAGNOSIS — L309 Dermatitis, unspecified: Secondary | ICD-10-CM

## 2022-11-12 DIAGNOSIS — Z0000 Encounter for general adult medical examination without abnormal findings: Secondary | ICD-10-CM

## 2022-11-12 DIAGNOSIS — L2089 Other atopic dermatitis: Secondary | ICD-10-CM | POA: Diagnosis not present

## 2022-11-12 DIAGNOSIS — L659 Nonscarring hair loss, unspecified: Secondary | ICD-10-CM

## 2022-11-12 DIAGNOSIS — E559 Vitamin D deficiency, unspecified: Secondary | ICD-10-CM | POA: Diagnosis not present

## 2022-11-12 MED ORDER — ATORVASTATIN CALCIUM 10 MG PO TABS: 10.0000 mg | ORAL_TABLET | Freq: Every day | ORAL | 3 refills | Status: DC

## 2022-11-12 MED ORDER — FENOFIBRATE 145 MG PO TABS: 145.0000 mg | ORAL_TABLET | Freq: Every day | ORAL | 3 refills | Status: DC

## 2022-11-12 NOTE — Progress Notes (Signed)
Patient ID: Brenda Rosales, female  DOB: 02-04-1986, 37 y.o.   MRN: [id number] Patient Care Team    Relationship Specialty Notifications Start End  Ma Hillock, DO PCP - General Family Medicine  04/23/16    Comment: Patient request  Nunzio Cobbs, MD Consulting Physician Obstetrics and Gynecology  04/23/16   Deneise Lever, MD Consulting Physician Pulmonary Disease  04/23/16   Irene Shipper, MD Consulting Physician Gastroenterology  04/23/16   Collier Salina (Inactive)  Dermatology  05/30/20     Chief Complaint  Patient presents with   Annual Exam    Subjective: Brenda Rosales is a 37 y.o.  Female  present for CPE . All past medical history, surgical history, allergies, family history, immunizations, medications and social history were updated in the electronic medical record today. All recent labs, ED visits and hospitalizations within the last year were reviewed.  Health maintenance:  Colonoscopy: adopted. Routine screen at 24.  Mammogram: adopted. Routine screen at 40 Cervical cancer screening: last pap: 2021- GYN Immunizations: tdap UTD 10/2021, Influenza UTD 2023 (encouraged yearly), PNA series completed Infectious disease screening: HIV and Hep C completed DEXA: routine screen Patient has a Dental home. Hospitalizations/ED visits: reviewed  Diabetes:  Pt now est with endocrine for dm management   HLD/overweight: Pt reports she is taking her tricor daily. She is back to exercising and eating healthier options.      11/12/2022    1:57 PM 10/22/2022    3:13 PM 10/21/2021    8:04 AM 11/20/2020    1:25 PM 06/09/2018    2:58 PM  Depression screen PHQ 2/9  Decreased Interest 0 0 0 0 0  Down, Depressed, Hopeless 0 0 0 0 0  PHQ - 2 Score 0 0 0 0 0      11/12/2022    1:57 PM  GAD 7 : Generalized Anxiety Score  Nervous, Anxious, on Edge 1  Control/stop worrying 3  Worry too much - different things 0  Trouble relaxing 1  Restless 3   Easily annoyed or irritable 1  Afraid - awful might happen 1  Total GAD 7 Score 10     Immunization History  Administered Date(s) Administered   Influenza Inj Mdck Quad With Preservative 08/19/2018   Influenza Whole 08/19/2009   Influenza,inj,Quad PF,6+ Mos 07/31/2016, 08/02/2019   Influenza-Unspecified 08/11/2015, 07/12/2017, 07/11/2020, 10/18/2021, 08/10/2022   Moderna Covid-19 Vaccine Bivalent Booster 81yr & up 10/18/2021   Moderna Sars-Covid-2 Vaccination 01/07/2020, 02/04/2020, 09/19/2020   Pneumococcal Conjugate-13 11/06/2016   Pneumococcal Polysaccharide-23 07/23/2007, 06/09/2018   Td 09/11/2004   Tdap 07/04/2012, 10/21/2021     Past Medical History:  Diagnosis Date   Allergy    Anxiety    ASTHMA 11/19/2007   Asthma    CHICKENPOX 11/19/2007   Diabetes mellitus without complication ([place]    diet controlled   Duodenitis    Dysmenorrhea 11/19/2007   DYSPHAGIA 11/19/2010   ECZEMA 11/19/2007   Esophageal stricture    Esophagitis    FATTY LIVER DISEASE 04/17/2008   GERD 11/20/2010   Hyperlipidemia    IBS (irritable bowel syndrome)    Infection of eyelid 08/07/2011   Low vitamin D level 2018   Other and unspecified noninfectious gastroenteritis and colitis(558.9) 02/2014   RHINOSINUSITIS, CHRONIC 12/09/2007   TB SKIN TEST, POSITIVE 11/19/2007   Allergies  Allergen Reactions   Vancomycin Other (See Comments)    redmans syndrome    Januvia [Sitagliptin] Other (  See Comments)    Dizziness   Past Surgical History:  Procedure Laterality Date   CT SINUS LTD W/O CM  09/23/2007   Mole excision     x's 4   Family History  Adopted: Yes   Social History   Social History Narrative   Adopted from [place].    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   Drinks caffeine, uses herbal remedies   Wears her seatbelt, wears bicycle helmet   Reports routine exercise    Smoke detector in the home.   Feels safe in  relationships.      Allergies as of 11/12/2022       Reactions   Vancomycin Other (See Comments)   redmans syndrome    Januvia [sitagliptin] Other (See Comments)   Dizziness        Medication List        Accurate as of November 12, 2022  2:27 PM. If you have any questions, ask your nurse or doctor.          STOP taking these medications    methocarbamol 500 MG tablet Commonly known as: ROBAXIN Stopped by: Howard Pouch, DO   naproxen 500 MG tablet Commonly known as: NAPROSYN Stopped by: Howard Pouch, DO   tacrolimus 0.1 % ointment Commonly known as: Protopic Stopped by: Howard Pouch, DO       TAKE these medications    albuterol 108 (90 Base) MCG/ACT inhaler Commonly known as: VENTOLIN HFA Inhale 2 puffs into the lungs every 6 (six) hours as needed for wheezing or shortness of breath. Refill upon request only. Can be filled with any albuterol on formulary.   atorvastatin 10 MG tablet Commonly known as: LIPITOR Take 1 tablet (10 mg total) by mouth daily.   blood glucose meter kit and supplies Dispense based on patient and insurance preference. Use up to four times daily as directed. DX E11.9   Contour Next One Kit See admin instructions.   Contour Next Test test strip Generic drug: glucose blood 1 EACH BY OTHER ROUTE DAILY. AND LANCETS 1/DAY   EPINEPHrine 0.3 mg/0.3 mL Soaj injection Commonly known as: EPI-PEN Inject 0.3 mg into the muscle once.   etonogestrel-ethinyl estradiol 0.12-0.015 MG/24HR vaginal ring Commonly known as: NuvaRing Insert vaginally and leave in place for 3 consecutive weeks, then remove and place a new ring for 3 weeks.  Repeat one additional time and then remove third ring and have a menstrual period.   fenofibrate 145 MG tablet Commonly known as: TRICOR Take 1 tablet (145 mg total) by mouth daily.   levocetirizine 5 MG tablet Commonly known as: XYZAL TAKE 1 TABLET BY MOUTH EVERY DAY IN THE EVENING   montelukast 10 MG  tablet Commonly known as: SINGULAIR every evening.   Symbicort 160-4.5 MCG/ACT inhaler Generic drug: budesonide-formoterol Inhale 2 puffs into the lungs 2 (two) times daily.   Synjardy XR 12.03-999 MG Tb24 Generic drug: Empagliflozin-metFORMIN HCl ER Take 2 tablets by mouth daily.   tirzepatide 2.5 MG/0.5ML Pen Commonly known as: MOUNJARO Inject 2.5 mg into the skin once a week.   tirzepatide 5 MG/0.5ML Pen Commonly known as: MOUNJARO Inject 5 mg into the skin once a week.        All past medical history, surgical history, allergies, family history, immunizations andmedications were updated in the EMR today and reviewed under the history and medication portions of their EMR.     ROS 14 pt review  of systems performed and negative (unless mentioned in an HPI)  Objective:  BP 96/66   Pulse 70   Temp 98.7 F (37.1 C)   Ht 5' 3.98" (1.625 m)   Wt 144 lb (65.3 kg)   LMP 10/11/2022   SpO2 97%   BMI 24.74 kg/m   Physical Exam Constitutional:      General: She is not in acute distress.    Appearance: Normal appearance. She is not ill-appearing or toxic-appearing.  HENT:     Head: Normocephalic and atraumatic.     Comments: Scalp: large shiny balding area right parietal and smaller area right sideburn. Small area x2 occipital.     Right Ear: Tympanic membrane, ear canal and external ear normal. There is no impacted cerumen.     Left Ear: Tympanic membrane, ear canal and external ear normal. There is no impacted cerumen.     Nose: No congestion or rhinorrhea.     Mouth/Throat:     Mouth: Mucous membranes are moist.     Pharynx: Oropharynx is clear. No oropharyngeal exudate or posterior oropharyngeal erythema.  Eyes:     General: No scleral icterus.       Right eye: No discharge.        Left eye: No discharge.     Extraocular Movements: Extraocular movements intact.     Conjunctiva/sclera: Conjunctivae normal.     Pupils: Pupils are equal, round, and reactive to  light.  Cardiovascular:     Rate and Rhythm: Normal rate and regular rhythm.     Pulses: Normal pulses.     Heart sounds: Normal heart sounds. No murmur heard.    No friction rub. No gallop.  Pulmonary:     Effort: Pulmonary effort is normal. No respiratory distress.     Breath sounds: Normal breath sounds. No stridor. No wheezing, rhonchi or rales.  Chest:     Chest wall: No tenderness.  Abdominal:     General: Abdomen is flat. Bowel sounds are normal. There is no distension.     Palpations: Abdomen is soft. There is no mass.     Tenderness: There is no abdominal tenderness. There is no right CVA tenderness, left CVA tenderness, guarding or rebound.     Hernia: No hernia is present.  Musculoskeletal:        General: No swelling, tenderness or deformity. Normal range of motion.     Cervical back: Normal range of motion and neck supple. No rigidity or tenderness.     Right lower leg: No edema.     Left lower leg: No edema.  Lymphadenopathy:     Cervical: No cervical adenopathy.  Skin:    General: Skin is warm and dry.     Coloration: Skin is not jaundiced or pale.     Findings: No bruising, erythema, lesion or rash.  Neurological:     General: No focal deficit present.     Mental Status: She is alert and oriented to person, place, and time. Mental status is at baseline.     Cranial Nerves: No cranial nerve deficit.     Sensory: No sensory deficit.     Motor: No weakness.     Coordination: Coordination normal.     Gait: Gait normal.     Deep Tendon Reflexes: Reflexes normal.  Psychiatric:        Mood and Affect: Mood normal.        Behavior: Behavior normal.  Thought Content: Thought content normal.        Judgment: Judgment normal.      Diabetic Foot Exam - Simple   Simple Foot Form Diabetic Foot exam was performed with the following findings: Yes 11/12/2022  2:01 PM  Visual Inspection No deformities, no ulcerations, no other skin breakdown bilaterally:  Yes Sensation Testing Intact to touch and monofilament testing bilaterally: Yes Pulse Check Posterior Tibialis and Dorsalis pulse intact bilaterally: Yes Comments     No results found.  Assessment/plan: Sinda Leedom is a 37 y.o. female present for CPE  Diabetes mellitus without complication (HCC)/overweight - now established with - endo.  reviewed recent labs collected at Endocrine.  -Tried medications: Januvia >> upset her stomach   Urine Microalbumin w/creat.: collected today - diabetic diet and routine exercise encouraged.  - eye exam: [facility] on [address]. 06/2020>encouraged her to schedule - foot exam completed 11/12/2022 - flu shot UTD  - PPSV 23 completed 06/09/18, prevnar 10/2016  - Willl continue to follow w/ endo   Hyperlipidemia/overweight:  - encouraged routine exercise and dietary modifications.  - educated pt on LDL goals in a diabetic.  - continue tricor 145 mg qd.  - continue lipitor 10 mg qd  - cbc, tsh collected today  Type 2 diabetes mellitus with other specified complication, without long-term current use of insulin (HCC) - Urine Microalbumin w/creat. ratio  Alopecia- New problem She has noticed over the last 6 months. She has multiple balding areas of her scalp.  She denies pulling or twisting hair. Wears a cap at work.  Referral to derm placed.   Routine general medical examination at a health care facility Colonoscopy: adopted. Routine screen at 32.  Mammogram: adopted. Routine screen at 40 Cervical cancer screening: last pap: 2021- GYN Immunizations: tdap updated today, Influenza UTD 2022 (encouraged yearly), PNA series completed Infectious disease screening: HIV and Hep C completed DEXA: routine screen Patient was encouraged to exercise greater than 150 minutes a week. Patient was encouraged to choose a diet filled with fresh fruits and vegetables, and lean meats. AVS provided to patient today for education/recommendation on gender specific  health and safety maintenance.  No follow-ups on file.  Orders Placed This Encounter  Procedures   Urine Microalbumin w/creat. ratio   CBC   TSH   Vitamin D (25 hydroxy)   Ambulatory referral to Dermatology   Meds ordered this encounter  Medications   fenofibrate (TRICOR) 145 MG tablet    Sig: Take 1 tablet (145 mg total) by mouth daily.    Dispense:  90 tablet    Refill:  3   atorvastatin (LIPITOR) 10 MG tablet    Sig: Take 1 tablet (10 mg total) by mouth daily.    Dispense:  90 tablet    Refill:  3   Referral Orders         Ambulatory referral to Dermatology       Electronically signed by: Howard Pouch, DO [place]

## 2022-11-13 ENCOUNTER — Telehealth: Payer: Self-pay | Admitting: Family Medicine

## 2022-11-13 LAB — CBC
HCT: 41.4 % (ref 35.0–45.0)
Hemoglobin: 14.7 g/dL (ref 11.7–15.5)
MCH: 31.5 pg (ref 27.0–33.0)
MCHC: 35.5 g/dL (ref 32.0–36.0)
MCV: 88.7 fL (ref 80.0–100.0)
MPV: 11.2 fL (ref 7.5–12.5)
Platelets: 315 10*3/uL (ref 140–400)
RBC: 4.67 10*6/uL (ref 3.80–5.10)
RDW: 11.8 % (ref 11.0–15.0)
WBC: 10.1 10*3/uL (ref 3.8–10.8)

## 2022-11-13 LAB — TSH: TSH: 1.87 mIU/L

## 2022-11-13 LAB — MICROALBUMIN / CREATININE URINE RATIO
Creatinine,U: 73.5 mg/dL
Microalb Creat Ratio: 1 mg/g (ref 0.0–30.0)
Microalb, Ur: <0.7 mg/dL (ref 0.0–1.9)

## 2022-11-13 LAB — VITAMIN D 25 HYDROXY (VIT D DEFICIENCY, FRACTURES): Vit D, 25-Hydroxy: 9 ng/mL — ABNORMAL LOW (ref 30–100)

## 2022-11-13 MED ORDER — VITAMIN D (ERGOCALCIFEROL) 1.25 MG (50000 UNIT) PO CAPS: ORAL_CAPSULE | ORAL | 0 refills | Status: DC

## 2022-11-13 NOTE — Telephone Encounter (Signed)
Please call patient thyroid function are normal Blood cell counts normal Vitamin D is severely deficient.  Although I do not believe it is the sole cause of her hair loss is certainly could be contributing.   -I have called in high-dose vitamin D capsules to be taken twice weekly for 12 weeks.  She is then to follow-up here at 12 weeks for retesting with provider.

## 2022-11-13 NOTE — Telephone Encounter (Signed)
Spoke with patient regarding results/recommendations.  

## 2022-12-09 ENCOUNTER — Other Ambulatory Visit: Payer: Self-pay | Admitting: Family Medicine

## 2023-01-04 ENCOUNTER — Encounter: Payer: Self-pay | Admitting: Family Medicine

## 2023-01-28 ENCOUNTER — Other Ambulatory Visit: Payer: 59
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
## 2023-02-02 ENCOUNTER — Ambulatory Visit: Payer: 59 | Admitting: Endocrinology

## 2023-02-05 ENCOUNTER — Ambulatory Visit: Payer: 59 | Admitting: Family Medicine

## 2023-02-06 ENCOUNTER — Ambulatory Visit: Payer: 59 | Admitting: Family Medicine

## 2023-02-09 ENCOUNTER — Other Ambulatory Visit: Payer: Self-pay | Admitting: Family Medicine

## 2023-02-12 ENCOUNTER — Encounter: Payer: Self-pay | Admitting: Obstetrics and Gynecology

## 2023-02-12 ENCOUNTER — Ambulatory Visit (INDEPENDENT_AMBULATORY_CARE_PROVIDER_SITE_OTHER): Payer: 59 | Admitting: Obstetrics and Gynecology

## 2023-02-12 VITALS — BP 110/68 | HR 84 | Ht 63.5 in | Wt 141.0 lb

## 2023-02-12 DIAGNOSIS — Z01419 Encounter for gynecological examination (general) (routine) without abnormal findings: Secondary | ICD-10-CM | POA: Diagnosis not present

## 2023-02-12 NOTE — Patient Instructions (Signed)

## 2023-03-23 ENCOUNTER — Other Ambulatory Visit: Payer: 59

## 2023-03-24 ENCOUNTER — Other Ambulatory Visit (INDEPENDENT_AMBULATORY_CARE_PROVIDER_SITE_OTHER): Payer: 59

## 2023-03-24 DIAGNOSIS — E1165 Type 2 diabetes mellitus with hyperglycemia: Secondary | ICD-10-CM | POA: Diagnosis not present

## 2023-03-24 LAB — BASIC METABOLIC PANEL
BUN: 11 mg/dL (ref 6–23)
CO2: 25 mEq/L (ref 19–32)
Calcium: 9.6 mg/dL (ref 8.4–10.5)
Chloride: 104 mEq/L (ref 96–112)
Creatinine, Ser: 0.69 mg/dL (ref 0.40–1.20)
GFR: 111 mL/min (ref >60.00)
Glucose, Bld: 203 mg/dL — ABNORMAL HIGH (ref 70–99)
Potassium: 4 mEq/L (ref 3.5–5.1)
Sodium: 138 mEq/L (ref 135–145)

## 2023-03-24 LAB — HEMOGLOBIN A1C: Hgb A1c MFr Bld: 7 % — ABNORMAL HIGH (ref 4.6–6.5)

## 2023-03-25 ENCOUNTER — Other Ambulatory Visit: Payer: Self-pay

## 2023-03-26 ENCOUNTER — Ambulatory Visit: Payer: 59 | Admitting: Endocrinology

## 2023-03-26 VITALS — BP 92/60 | HR 72 | Resp 16 | Ht 63.5 in | Wt 137.0 lb

## 2023-03-26 DIAGNOSIS — E1165 Type 2 diabetes mellitus with hyperglycemia: Secondary | ICD-10-CM

## 2023-03-26 LAB — POCT GLUCOSE (DEVICE FOR HOME USE): Glucose Fasting, POC: 89 mg/dL (ref 70–99)

## 2023-03-26 MED ORDER — FREESTYLE LIBRE 3 SENSOR MISC: 1.0000 | 2 refills | Status: DC

## 2023-03-26 NOTE — Patient Instructions (Addendum)
Check blood sugars on waking up 3 days a week ° °Also check blood sugars about 2 hours after meals and do this after different meals by rotation ° °Recommended blood sugar levels on waking up are 90-130 and about 2 hours after meal is 130-180 ° °Please bring your blood sugar monitor to each visit, thank you ° °

## 2023-03-26 NOTE — Progress Notes (Signed)
Patient ID: Brenda Rosales, female   DOB: 06-15-1986, 37 y.o.   MRN: [id number]           Reason for Appointment: Type II Diabetes follow-up   History of Present Illness   Diagnosis date: 2012  Previous history:  Non-insulin hypoglycemic drugs previously used: Metformin glipizide Januvia, Janumet, Rybelsus, Jardiance  A1c range in the last few years is: 6.5-9.1  Recent history:     Non-insulin hypoglycemic drugs: Synjardy XR 12.03/999, 2 tablets a day   Side effects from medications: None  Current self management, blood sugar patterns and problems identified:  A1c is 7 compared to 7.1 Last visit was in 12/23  She was recommended Mounjaro on her last visit but she was not able to fill this at the pharmacy because of short supply and did not let us know Also because of intercurrent illnesses she has not checked her blood sugars However today blood sugar is 86 fasting Lab glucose was over 200 after breakfast but sometimes in the morning she has a fruit smoothie Otherwise she thinks she is doing fairly well with her diet Appears to have lost some weight She does try to take her Synjardy regularly at breakfast and dinnertime, unable to tolerate 2 tablets together  Exercise:  walking, getting back into the gym in the last 3 weeks  Diet management: Very small meals and protein shakes, no dairy, breakfast smoothie/yogurt     Hypoglycemia:  none    Glucometer:    Contour Blood Glucose readings as above, meter not available  Dietician visit: Most recent: none     Weight control: Max weight 170 lbs  Wt Readings from Last 3 Encounters:  03/26/23 137 lb (62.1 kg)  02/12/23 141 lb (64 kg)  11/12/22 144 lb (65.3 kg)            Diabetes labs:  Lab Results  Component Value Date   HGBA1C 7.0 (H) 03/24/2023   HGBA1C 7.1 (H) 10/14/2022   HGBA1C 8.4 (H) 07/18/2022   Lab Results  Component Value Date   MICROALBUR <0.7 11/12/2022   LDLCALC 96 10/14/2022   CREATININE 0.69  03/24/2023    No results found for: "FRUCTOSAMINE"   Allergies as of 03/26/2023       Reactions   Vancomycin Other (See Comments)   redmans syndrome    Januvia [sitagliptin] Other (See Comments)   Dizziness        Medication List        Accurate as of Mar 26, 2023  9:52 AM. If you have any questions, ask your nurse or doctor.          albuterol 108 (90 Base) MCG/ACT inhaler Commonly known as: VENTOLIN HFA Inhale 2 puffs into the lungs every 6 (six) hours as needed for wheezing or shortness of breath. Refill upon request only. Can be filled with any albuterol on formulary.   amoxicillin-clavulanate 875-125 MG tablet Commonly known as: AUGMENTIN Take 1 tablet by mouth 2 (two) times daily.   atorvastatin 10 MG tablet Commonly known as: LIPITOR Take 1 tablet (10 mg total) by mouth daily.   blood glucose meter kit and supplies Dispense based on patient and insurance preference. Use up to four times daily as directed. DX E11.9   Contour Next One Kit See admin instructions.   Contour Next Test test strip Generic drug: glucose blood 1 EACH BY OTHER ROUTE DAILY. AND LANCETS 1/DAY   EPINEPHrine 0.3 mg/0.3 mL Soaj injection Commonly known as: EPI-PEN Inject  0.3 mg into the muscle once.   fenofibrate 145 MG tablet Commonly known as: TRICOR Take 1 tablet (145 mg total) by mouth daily.   levocetirizine 5 MG tablet Commonly known as: XYZAL TAKE 1 TABLET BY MOUTH EVERY DAY IN THE EVENING   montelukast 10 MG tablet Commonly known as: SINGULAIR every evening.   Symbicort 160-4.5 MCG/ACT inhaler Generic drug: budesonide-formoterol Inhale 2 puffs into the lungs 2 (two) times daily.   Synjardy XR 12.03-999 MG Tb24 Generic drug: Empagliflozin-metFORMIN HCl ER Take 2 tablets by mouth daily.   tirzepatide 2.5 MG/0.5ML Pen Commonly known as: MOUNJARO Inject 2.5 mg into the skin once a week.   tirzepatide 5 MG/0.5ML Pen Commonly known as: MOUNJARO Inject 5 mg into  the skin once a week.        Allergies:  Allergies  Allergen Reactions   Vancomycin Other (See Comments)    redmans syndrome    Januvia [Sitagliptin] Other (See Comments)    Dizziness    Past Medical History:  Diagnosis Date   Allergy    Anxiety    ASTHMA 11/19/2007   Asthma    CHICKENPOX 11/19/2007   Diabetes mellitus without complication (HCC)    diet controlled   Duodenitis    Dysmenorrhea 11/19/2007   DYSPHAGIA 11/19/2010   ECZEMA 11/19/2007   Esophageal stricture    Esophagitis    FATTY LIVER DISEASE 04/17/2008   GERD 11/20/2010   Hyperlipidemia    IBS (irritable bowel syndrome)    Infection of eyelid 08/07/2011   Low vitamin D level 2018   Other and unspecified noninfectious gastroenteritis and colitis(558.9) 02/2014   RHINOSINUSITIS, CHRONIC 12/09/2007   TB SKIN TEST, POSITIVE 11/19/2007    Past Surgical History:  Procedure Laterality Date   CT SINUS LTD W/O CM  09/23/2007   Mole excision     x's 4    Family History  Adopted: Yes    Social History:  reports that she has never smoked. She has never used smokeless tobacco. She reports that she does not currently use alcohol. She reports that she does not use drugs.  Review of Systems:  Last diabetic eye exam date 6/21  Last urine microalbumin date: 12/22  Last foot exam date: 1/24  Symptoms of neuropathy: None  Hypertension: Not present   BP Readings from Last 3 Encounters:  03/26/23 92/60  02/12/23 110/68  11/12/22 96/66    Lipid management: She is on her Lipitor as recommended and LDL is better    Lab Results  Component Value Date   CHOL 165 10/14/2022   CHOL 172 10/21/2021   CHOL 152 03/07/2021   Lab Results  Component Value Date   HDL 51.20 10/14/2022   HDL 55.20 10/21/2021   HDL 39.50 03/07/2021   Lab Results  Component Value Date   LDLCALC 96 10/14/2022   LDLCALC 82 10/21/2021   LDLCALC 77 03/07/2021   Lab Results  Component Value Date   TRIG 91.0 10/14/2022    TRIG 176.0 (H) 10/21/2021   TRIG 174.0 (H) 03/07/2021   Lab Results  Component Value Date   CHOLHDL 3 10/14/2022   CHOLHDL 3 10/21/2021   CHOLHDL 4 03/07/2021   Lab Results  Component Value Date   LDLDIRECT 142.0 04/27/2019    Unable to calculate Fibrosis 4 Score. Requires ALT, AST, and platelet count within the last 6 months. Reportedly has fatty liver as diagnosed by gastroenterologist     Examination:   BP 92/60   Pulse  72   Resp 16   Ht 5' 3.5" (1.613 m)   Wt 137 lb (62.1 kg)   SpO2 99%   BMI 23.89 kg/m   Body mass index is 23.89 kg/m.    ASSESSMENT/ PLAN:    Diabetes type 2 non-insulin-dependent:   Current regimen: Synjardy XR  See history of present illness for detailed discussion of current diabetes management, blood sugar patterns and problems identified  A1c is 7% and stable  A1c is still fairly good even though she has not been able to get [facility] again Has been able to lose a little more weight She is not motivated to check her blood sugars and is interested in CGM Also is open to seeing a nutritionist for diet management as she has not done so before and referral placed  Recommendations:  Will first start monitoring her blood sugars more consistently especially after meals to see if she is getting consistent postprandial hyperglycemia Prescription for freestyle libre sensor sent to see if this is covered She will try to cut back on high carbohydrate foods such as fruit smoothies that appear to be raising her sugar She will also be trying to get more consistent with her exercise at the gym  Hyperlipidemia: Followed by PCP  Follow-up in 3 months  There are no Patient Instructions on file for this visit.   Reather Littler 03/26/2023, 9:52 AM

## 2023-06-22 ENCOUNTER — Ambulatory Visit: Payer: 59 | Admitting: Dietician

## 2023-08-07 ENCOUNTER — Other Ambulatory Visit (INDEPENDENT_AMBULATORY_CARE_PROVIDER_SITE_OTHER): Payer: 59

## 2023-08-07 DIAGNOSIS — E1165 Type 2 diabetes mellitus with hyperglycemia: Secondary | ICD-10-CM | POA: Diagnosis not present

## 2023-08-07 LAB — BASIC METABOLIC PANEL
BUN: 10 mg/dL (ref 6–23)
CO2: 26 meq/L (ref 19–32)
Calcium: 9.2 mg/dL (ref 8.4–10.5)
Chloride: 105 meq/L (ref 96–112)
Creatinine, Ser: 0.65 mg/dL (ref 0.40–1.20)
GFR: 112.32 mL/min (ref >60.00)
Glucose, Bld: 138 mg/dL — ABNORMAL HIGH (ref 70–99)
Potassium: 4.1 meq/L (ref 3.5–5.1)
Sodium: 139 meq/L (ref 135–145)

## 2023-08-07 LAB — HEMOGLOBIN A1C: Hgb A1c MFr Bld: 6.7 % — ABNORMAL HIGH (ref 4.6–6.5)

## 2023-08-14 ENCOUNTER — Encounter: Payer: Self-pay | Admitting: Endocrinology

## 2023-08-14 ENCOUNTER — Ambulatory Visit: Payer: 59 | Admitting: Endocrinology

## 2023-08-14 VITALS — BP 90/63 | HR 76 | Ht 63.5 in | Wt 140.2 lb

## 2023-08-14 DIAGNOSIS — Z7985 Long-term (current) use of injectable non-insulin antidiabetic drugs: Secondary | ICD-10-CM | POA: Diagnosis not present

## 2023-08-14 DIAGNOSIS — E119 Type 2 diabetes mellitus without complications: Secondary | ICD-10-CM

## 2023-08-14 DIAGNOSIS — E782 Mixed hyperlipidemia: Secondary | ICD-10-CM

## 2023-08-14 NOTE — Progress Notes (Signed)
Outpatient Endocrinology Note Brenda Pleas Carneal, MD  08/14/23  Patient's Name: Brenda Rosales    DOB: 05/02/1986    MRN: [id number]                                                    REASON OF VISIT: Follow up for type 2 diabetes mellitus  PCP: Felix Pacini A, DO  HISTORY OF PRESENT ILLNESS:   Brenda Rosales is a 37 y.o. old female with past medical history listed below, is here for follow up for type 2 diabetes mellitus.   Pertinent Diabetes History: Patient was diagnosed with type 2 diabetes mellitus in 2012.  Chronic Diabetes Complications : Retinopathy: no. Last ophthalmology exam was done on annually.  Nephropathy: no Peripheral neuropathy: no Coronary artery disease: no Stroke: no  Relevant comorbidities and cardiovascular risk factors: Obesity: no Body mass index is 24.45 kg/m.  Hypertension: no Hyperlipidemia. yes  Current / Home Diabetic regimen includes: Synjardy XR 12.03/999, 1 tablet 2 times a day.  Prior diabetic medications: She had tried to [facility] for about a month around December 2023, stopped due to backorder issues.  Glycemic data:   She has not been checking blood sugar at home.  She has freestyle libre 3 CGM has not restarted it.  Hypoglycemia: Patient has no hypoglycemic episodes. Patient has hypoglycemia awareness.  Factors modifying glucose control: 1.  Diabetic diet assessment: Small meals 2-3 meals a day.  2.  Staying active or exercising: Gym and walking.  3.  Medication compliance: compliant all of the time.  Interval history 08/14/23 Patient has been taking Synjardy 1 tablet 2 times a day.  She feels better taking 2 times a day then once daily.  She had tried [facility] in the past however she has noticed significant appetite suppression.  She stopped taking due to backorder issue at that time.  No glucometer data to review.  Hemoglobin A1c 6.7%.  No numbness and tingling of the feet.  No vision problem.  No other complaints  today.  REVIEW OF SYSTEMS As per history of present illness.   PAST MEDICAL HISTORY: Past Medical History:  Diagnosis Date   Allergy    Anxiety    ASTHMA 11/19/2007   Asthma    CHICKENPOX 11/19/2007   Diabetes mellitus without complication (HCC)    diet controlled   Duodenitis    Dysmenorrhea 11/19/2007   DYSPHAGIA 11/19/2010   ECZEMA 11/19/2007   Esophageal stricture    Esophagitis    FATTY LIVER DISEASE 04/17/2008   GERD 11/20/2010   Hyperlipidemia    IBS (irritable bowel syndrome)    Infection of eyelid 08/07/2011   Low vitamin D level 2018   Other and unspecified noninfectious gastroenteritis and colitis(558.9) 02/2014   RHINOSINUSITIS, CHRONIC 12/09/2007   TB SKIN TEST, POSITIVE 11/19/2007    PAST SURGICAL HISTORY: Past Surgical History:  Procedure Laterality Date   CT SINUS LTD W/O CM  09/23/2007   Mole excision     x's 4    ALLERGIES: Allergies  Allergen Reactions   Vancomycin Other (See Comments)    redmans syndrome    Januvia [Sitagliptin] Other (See Comments)    Dizziness    FAMILY HISTORY:  Family History  Adopted: Yes    SOCIAL HISTORY: Social History   Socioeconomic History   Marital status: Single  Spouse name: Not on file   Number of children: 0   Years of education: Not on file   Highest education level: Bachelor's degree (e.g., BA, AB, BS)  Occupational History   Occupation: Magazine features editor: [employer]  Tobacco Use   Smoking status: Never   Smokeless tobacco: Never  Vaping Use   Vaping status: Never Used  Substance and Sexual Activity   Alcohol use: Not Currently    Comment: 2 drinks/month   Drug use: No   Sexual activity: Not Currently    Partners: Male  Other Topics Concern   Not on file  Social History Narrative   Adopted from [place].    College Occupational psychologist in [employer]   Single. No children.   Drinks caffeine, uses herbal remedies   Wears her seatbelt, wears bicycle  helmet   Reports routine exercise    Smoke detector in the home.   Feels safe in relationships.     Social Determinants of Health   Financial Resource Strain: Medium Risk (10/20/2022)   Overall Financial Resource Strain (CARDIA)    Difficulty of Paying Living Expenses: Somewhat hard  Food Insecurity: No Food Insecurity (10/20/2022)   Hunger Vital Sign    Worried About Running Out of Food in the Last Year: Never true    Ran Out of Food in the Last Year: Never true  Transportation Needs: No Transportation Needs (10/20/2022)   PRAPARE - Administrator, Civil Service (Medical): No    Lack of Transportation (Non-Medical): No  Physical Activity: Insufficiently Active (10/20/2022)   Exercise Vital Sign    Days of Exercise per Week: 3 days    Minutes of Exercise per Session: 30 min  Stress: No Stress Concern Present (10/20/2022)   [employer] of Occupational Health - Occupational Stress Questionnaire    Feeling of Stress : Not at all  Social Connections: Moderately Isolated (10/20/2022)   Social Connection and Isolation Panel [NHANES]    Frequency of Communication with Friends and Family: More than three times a week    Frequency of Social Gatherings with Friends and Family: More than three times a week    Attends Religious Services: Never    Database administrator or Organizations: Yes    Attends Engineer, structural: More than 4 times per year    Marital Status: Never married    MEDICATIONS:  Current Outpatient Medications  Medication Sig Dispense Refill   albuterol (VENTOLIN HFA) 108 (90 Base) MCG/ACT inhaler Inhale 2 puffs into the lungs every 6 (six) hours as needed for wheezing or shortness of breath. Refill upon request only. Can be filled with any albuterol on formulary. 8 g 11   atorvastatin (LIPITOR) 10 MG tablet Take 1 tablet (10 mg total) by mouth daily. 90 tablet 3   Empagliflozin-metFORMIN HCl ER (SYNJARDY XR) 12.03-999 MG TB24 Take 2 tablets by  mouth daily. 60 tablet 5   EPINEPHrine 0.3 mg/0.3 mL IJ SOAJ injection Inject 0.3 mg into the muscle once.     fenofibrate (TRICOR) 145 MG tablet Take 1 tablet (145 mg total) by mouth daily. 90 tablet 3   levocetirizine (XYZAL) 5 MG tablet TAKE 1 TABLET BY MOUTH EVERY DAY IN THE EVENING 90 tablet 0   montelukast (SINGULAIR) 10 MG tablet every evening.     SYMBICORT 160-4.5 MCG/ACT inhaler Inhale 2 puffs into the lungs 2 (two) times daily.  3   amoxicillin-clavulanate (AUGMENTIN) 875-125  MG tablet Take 1 tablet by mouth 2 (two) times daily.     blood glucose meter kit and supplies Dispense based on patient and insurance preference. Use up to four times daily as directed. DX E11.9 (Patient not taking: Reported on 08/14/2023) 1 each 11   Blood Glucose Monitoring Suppl (CONTOUR NEXT ONE) KIT See admin instructions. (Patient not taking: Reported on 03/26/2023)     Continuous Glucose Sensor (FREESTYLE LIBRE 3 SENSOR) MISC 1 Device by Does not apply route every 14 (fourteen) days. Apply 1 sensor on upper arm every 14 days for continuous glucose monitoring (Patient not taking: Reported on 08/14/2023) 2 each 2   CONTOUR NEXT TEST test strip 1 EACH BY OTHER ROUTE DAILY. AND LANCETS 1/DAY (Patient not taking: Reported on 03/26/2023) 100 strip 3   No current facility-administered medications for this visit.    PHYSICAL EXAM: Vitals:   08/14/23 0758  BP: 90/63  Pulse: 76  SpO2: 95%  Weight: 140 lb 3.2 oz (63.6 kg)  Height: 5' 3.5" (1.613 m)   Body mass index is 24.45 kg/m.  Wt Readings from Last 3 Encounters:  08/14/23 140 lb 3.2 oz (63.6 kg)  03/26/23 137 lb (62.1 kg)  02/12/23 141 lb (64 kg)    General: Well developed, well nourished female in no apparent distress.  HEENT: AT/[place], no external lesions.  Eyes: Conjunctiva clear and no icterus. Neck: Neck supple  Lungs: Respirations not labored Neurologic: Alert, oriented, normal speech Extremities / Skin: Dry. No sores or rashes noted.   Psychiatric: Does not appear depressed or anxious  Diabetic Foot Exam - Simple   No data filed    LABS Reviewed Lab Results  Component Value Date   HGBA1C 6.7 (H) 08/07/2023   HGBA1C 7.0 (H) 03/24/2023   HGBA1C 7.1 (H) 10/14/2022   No results found for: "FRUCTOSAMINE" Lab Results  Component Value Date   CHOL 165 10/14/2022   HDL 51.20 10/14/2022   LDLCALC 96 10/14/2022   LDLDIRECT 142.0 04/27/2019   TRIG 91.0 10/14/2022   CHOLHDL 3 10/14/2022   Lab Results  Component Value Date   MICRALBCREAT 1.0 11/12/2022   MICRALBCREAT 2.0 10/21/2021   Lab Results  Component Value Date   CREATININE 0.65 08/07/2023   Lab Results  Component Value Date   GFR 112.32 08/07/2023    ASSESSMENT / PLAN  1. Diabetes mellitus without complication (HCC)   2. Type 2 diabetes mellitus without complication, without long-term current use of insulin (HCC)   3. Mixed hyperlipidemia     Diabetes Mellitus type 2, complicated by no known complications. - Diabetic status / severity: Controlled.  Lab Results  Component Value Date   HGBA1C 6.7 (H) 08/07/2023    - Hemoglobin A1c goal : <7%  Discussed that Greggory Keen can be considered however not required for diabetes control at this time.  Her BMI is 25, not much weight to lose.  She had significant appetite suppression in the past when she had taken [facility].  We decided not to restart Mounjaro at this time.  GLP-1 receptor agonist will be considered if for diabetes control is started to get worse.  - Medications: No change.  I) Synjardy 12.03/999 mg 1 tablet 2 times a day.  - Home glucose testing: Use freestyle libre 3 CGM or check blood sugar in the morning fasting and at bedtime at least few times a week. - Discussed/ Gave Hypoglycemia treatment plan.  # Consult : not required at this time.   # Annual  urine for microalbuminuria/ creatinine ratio, no microalbuminuria currently, continue ACE/ARB /we will check BMP and urine microalbumin  creatinine ratio in 3 months. Last  Lab Results  Component Value Date   MICRALBCREAT 1.0 11/12/2022    # Foot check nightly.  # Annual dilated diabetic eye exams.   - Diet: Make healthy diabetic food choices - Life style / activity / exercise: Discussed.  2. Blood pressure  -  BP Readings from Last 1 Encounters:  08/14/23 90/63    - Control is in target.  - No change in current plans.  3. Lipid status / Hyperlipidemia - Last  Lab Results  Component Value Date   LDLCALC 96 10/14/2022   - Continue atorvastatin 10 mg daily and fenofibrate 145 mg daily. -Will check lipid panel in 3 months.  Diagnoses and all orders for this visit:  Diabetes mellitus without complication (HCC)  Type 2 diabetes mellitus without complication, without long-term current use of insulin (HCC) -     Microalbumin / creatinine urine ratio; Future -     Lipid panel; Future -     Basic metabolic panel; Future -     Hemoglobin A1c; Future  Mixed hyperlipidemia -     Lipid panel; Future    DISPOSITION Follow up in clinic in 3 months suggested.  Labs 1 week prior to follow-up visit in 3 months.   All questions answered and patient verbalized understanding of the plan.  Brenda Joshuah Minella, MD [facility] Endocrinology [facility] [address] [place], [address] [place], [place] [address] Phone # [phone]  At least part of this note was generated using voice recognition software. Inadvertent word errors may have occurred, which were not recognized during the proofreading process.

## 2023-09-06 ENCOUNTER — Ambulatory Visit
Admission: EM | Admit: 2023-09-06 | Discharge: 2023-09-06 | Disposition: A | Discharge status: Home / Self Care | Payer: 59 | Attending: Internal Medicine | Admitting: Internal Medicine

## 2023-09-06 DIAGNOSIS — L03012 Cellulitis of left finger: Secondary | ICD-10-CM | POA: Diagnosis not present

## 2023-09-06 MED ORDER — DOXYCYCLINE HYCLATE 100 MG PO CAPS: 100.0000 mg | ORAL_CAPSULE | Freq: Two times a day (BID) | ORAL | 0 refills | Status: DC

## 2023-09-06 NOTE — Discharge Instructions (Addendum)
Start doxycycline for the infection. Take this for 10 days. Use ibuprofen 600mg  three times daily as needed for pain and inflammation. Use warm soaks 3-5 times daily 5-10 minutes at a time as your schedule allows.

## 2023-09-06 NOTE — ED Triage Notes (Signed)
Pt reports pain, swelling and redness in left ring finger x 3 days after she peeled the skin when she was having an anxiety attack.

## 2023-09-06 NOTE — ED Provider Notes (Signed)
Brenda Rosales [facility]  Note:  This document was prepared using Conservation officer, historic buildings and may include unintentional dictation errors.  MRN: [id number] DOB: 06-30-1986  Subjective:   Analy Rosales is Brenda 37 y.o. female presenting for 3-day history of acute onset persistent left ring finger pain and redness.  Patient reports that she was chewing on Brenda hangnail and feels that she pulled the back too much.  No fever, drainage of pus or bleeding.  No current facility-administered medications for this encounter.  Current Outpatient Medications:    albuterol (VENTOLIN HFA) 108 (90 Base) MCG/ACT inhaler, Inhale 2 puffs into the lungs every 6 (six) hours as needed for wheezing or shortness of breath. Refill upon request only. Can be filled with any albuterol on formulary., Disp: 8 g, Rfl: 11   amoxicillin-clavulanate (AUGMENTIN) 875-125 MG tablet, Take 1 tablet by mouth 2 (two) times daily., Disp: , Rfl:    atorvastatin (LIPITOR) 10 MG tablet, Take 1 tablet (10 mg total) by mouth daily., Disp: 90 tablet, Rfl: 3   blood glucose meter kit and supplies, Dispense based on patient and insurance preference. Use up to four times daily as directed. DX E11.9 (Patient not taking: Reported on 08/14/2023), Disp: 1 each, Rfl: 11   Blood Glucose Monitoring Suppl (CONTOUR NEXT ONE) KIT, See admin instructions. (Patient not taking: Reported on 03/26/2023), Disp: , Rfl:    Continuous Glucose Sensor (FREESTYLE LIBRE 3 SENSOR) MISC, 1 Device by Does not apply route every 14 (fourteen) days. Apply 1 sensor on upper arm every 14 days for continuous glucose monitoring (Patient not taking: Reported on 08/14/2023), Disp: 2 each, Rfl: 2   CONTOUR NEXT TEST test strip, 1 EACH BY OTHER ROUTE DAILY. AND LANCETS 1/DAY (Patient not taking: Reported on 03/26/2023), Disp: 100 strip, Rfl: 3   Empagliflozin-metFORMIN HCl ER (SYNJARDY XR) 12.03-999 MG TB24, Take 2 tablets by mouth daily., Disp: 60 tablet, Rfl: 5    EPINEPHrine 0.3 mg/0.3 mL IJ SOAJ injection, Inject 0.3 mg into the muscle once., Disp: , Rfl:    fenofibrate (TRICOR) 145 MG tablet, Take 1 tablet (145 mg total) by mouth daily., Disp: 90 tablet, Rfl: 3   levocetirizine (XYZAL) 5 MG tablet, TAKE 1 TABLET BY MOUTH EVERY DAY IN THE EVENING, Disp: 90 tablet, Rfl: 0   montelukast (SINGULAIR) 10 MG tablet, every evening., Disp: , Rfl:    SYMBICORT 160-4.5 MCG/ACT inhaler, Inhale 2 puffs into the lungs 2 (two) times daily., Disp: , Rfl: 3   Allergies  Allergen Reactions   Vancomycin Other (See Comments)    redmans syndrome    Januvia [Sitagliptin] Other (See Comments)    Dizziness    Past Medical History:  Diagnosis Date   Allergy    Anxiety    ASTHMA 11/19/2007   Asthma    CHICKENPOX 11/19/2007   Diabetes mellitus without complication (HCC)    diet controlled   Duodenitis    Dysmenorrhea 11/19/2007   DYSPHAGIA 11/19/2010   ECZEMA 11/19/2007   Esophageal stricture    Esophagitis    FATTY LIVER DISEASE 04/17/2008   GERD 11/20/2010   Hyperlipidemia    IBS (irritable bowel syndrome)    Infection of eyelid 08/07/2011   Low vitamin D level 2018   Other and unspecified noninfectious gastroenteritis and colitis(558.9) 02/2014   RHINOSINUSITIS, CHRONIC 12/09/2007   TB SKIN TEST, POSITIVE 11/19/2007     Past Surgical History:  Procedure Laterality Date   CT SINUS LTD W/O CM  09/23/2007  Mole excision     x's 4    Family History  Adopted: Yes    Social History   Tobacco Use   Smoking status: Never   Smokeless tobacco: Never  Vaping Use   Vaping status: Never Used  Substance Use Topics   Alcohol use: Yes    Comment: Occa   Drug use: No    ROS   Objective:   Vitals: BP 111/75 (BP Location: Left Arm)   Pulse 82   Temp 98.7 F (37.1 C) (Oral)   Resp 16   LMP 08/30/2023 (Exact Date)   SpO2 95%   Physical Exam Constitutional:      General: She is not in acute distress.    Appearance: Normal appearance. She  is well-developed. She is not ill-appearing, toxic-appearing or diaphoretic.  HENT:     Head: Normocephalic and atraumatic.     Nose: Nose normal.     Mouth/Throat:     Mouth: Mucous membranes are moist.  Eyes:     General: No scleral icterus.       Right eye: No discharge.        Left eye: No discharge.     Extraocular Movements: Extraocular movements intact.  Cardiovascular:     Rate and Rhythm: Normal rate.  Pulmonary:     Effort: Pulmonary effort is normal.  Musculoskeletal:       Hands:  Skin:    General: Skin is warm and dry.  Neurological:     General: No focal deficit present.     Mental Status: She is alert and oriented to person, place, and time.  Psychiatric:        Mood and Affect: Mood normal.        Behavior: Behavior normal.     Assessment and Plan :   PDMP not reviewed this encounter.  1. Cellulitis of left ring finger    No appreciable paronychia.  Recommended coverage with doxycycline for cellulitis.  Counseled patient on potential for adverse effects with medications prescribed/recommended today, ER and return-to-clinic precautions discussed, patient verbalized understanding.    Wallis Bamberg, PA-C 09/06/23 1239

## 2023-11-11 ENCOUNTER — Other Ambulatory Visit: Payer: Self-pay | Admitting: Medical Genetics

## 2023-11-13 ENCOUNTER — Other Ambulatory Visit: Payer: 59

## 2023-11-17 ENCOUNTER — Ambulatory Visit (INDEPENDENT_AMBULATORY_CARE_PROVIDER_SITE_OTHER): Payer: 59 | Admitting: Family Medicine

## 2023-11-17 ENCOUNTER — Encounter: Payer: Self-pay | Admitting: Family Medicine

## 2023-11-17 VITALS — BP 100/64 | HR 69 | Temp 97.7°F | Ht 64.17 in | Wt 143.8 lb

## 2023-11-17 DIAGNOSIS — Z23 Encounter for immunization: Secondary | ICD-10-CM

## 2023-11-17 DIAGNOSIS — Z0000 Encounter for general adult medical examination without abnormal findings: Secondary | ICD-10-CM | POA: Diagnosis not present

## 2023-11-17 DIAGNOSIS — E1169 Type 2 diabetes mellitus with other specified complication: Secondary | ICD-10-CM | POA: Diagnosis not present

## 2023-11-17 DIAGNOSIS — Z7984 Long term (current) use of oral hypoglycemic drugs: Secondary | ICD-10-CM

## 2023-11-17 DIAGNOSIS — R600 Localized edema: Secondary | ICD-10-CM | POA: Insufficient documentation

## 2023-11-17 DIAGNOSIS — E559 Vitamin D deficiency, unspecified: Secondary | ICD-10-CM | POA: Diagnosis not present

## 2023-11-17 DIAGNOSIS — E781 Pure hyperglyceridemia: Secondary | ICD-10-CM | POA: Diagnosis not present

## 2023-11-17 DIAGNOSIS — K760 Fatty (change of) liver, not elsewhere classified: Secondary | ICD-10-CM

## 2023-11-17 DIAGNOSIS — E663 Overweight: Secondary | ICD-10-CM

## 2023-11-17 LAB — CBC
HCT: 45.3 % (ref 36.0–46.0)
Hemoglobin: 15.3 g/dL — ABNORMAL HIGH (ref 12.0–15.0)
MCHC: 33.8 g/dL (ref 30.0–36.0)
MCV: 94.2 fL (ref 78.0–100.0)
Platelets: 271 10*3/uL (ref 150.0–400.0)
RBC: 4.81 Mil/uL (ref 3.87–5.11)
RDW: 13.4 % (ref 11.5–15.5)
WBC: 8.6 10*3/uL (ref 4.0–10.5)

## 2023-11-17 LAB — LIPID PANEL
Cholesterol: 201 mg/dL — ABNORMAL HIGH (ref 0–200)
HDL: 51.4 mg/dL (ref >39.00)
LDL Cholesterol: 83 mg/dL (ref 0–99)
NonHDL: 149.68
Total CHOL/HDL Ratio: 4
Triglycerides: 335 mg/dL — ABNORMAL HIGH (ref 0.0–149.0)
VLDL: 67 mg/dL — ABNORMAL HIGH (ref 0.0–40.0)

## 2023-11-17 LAB — COMPREHENSIVE METABOLIC PANEL
ALT: 13 U/L (ref 0–35)
AST: 17 U/L (ref 0–37)
Albumin: 4.7 g/dL (ref 3.5–5.2)
Alkaline Phosphatase: 58 U/L (ref 39–117)
BUN: 20 mg/dL (ref 6–23)
CO2: 29 meq/L (ref 19–32)
Calcium: 9.6 mg/dL (ref 8.4–10.5)
Chloride: 100 meq/L (ref 96–112)
Creatinine, Ser: 0.64 mg/dL (ref 0.40–1.20)
GFR: 112.52 mL/min (ref >60.00)
Glucose, Bld: 165 mg/dL — ABNORMAL HIGH (ref 70–99)
Potassium: 3.8 meq/L (ref 3.5–5.1)
Sodium: 136 meq/L (ref 135–145)
Total Bilirubin: 0.5 mg/dL (ref 0.2–1.2)
Total Protein: 7.7 g/dL (ref 6.0–8.3)

## 2023-11-17 LAB — VITAMIN D 25 HYDROXY (VIT D DEFICIENCY, FRACTURES): VITD: 14.03 ng/mL — ABNORMAL LOW (ref 30.00–100.00)

## 2023-11-17 LAB — TSH: TSH: 2.77 u[IU]/mL (ref 0.35–5.50)

## 2023-11-17 LAB — HEMOGLOBIN A1C: Hgb A1c MFr Bld: 7.4 % — ABNORMAL HIGH (ref 4.6–6.5)

## 2023-11-17 MED ORDER — ATORVASTATIN CALCIUM 10 MG PO TABS: 10.0000 mg | ORAL_TABLET | Freq: Every day | ORAL | 3 refills | Status: DC

## 2023-11-17 MED ORDER — FENOFIBRATE 145 MG PO TABS: 145.0000 mg | ORAL_TABLET | Freq: Every day | ORAL | 3 refills | Status: AC

## 2023-11-17 NOTE — Patient Instructions (Addendum)

## 2023-11-17 NOTE — Progress Notes (Signed)
 Patient ID: Brenda Rosales, female  DOB: 31-May-1986, 38 y.o.   MRN: [id number] Patient Care Team    Relationship Specialty Notifications Start End  Catherine Charlies LABOR, DO PCP - General Family Medicine  04/23/16    Comment: Patient request  Cathlyn JAYSON Nikki Bobie FORBES, MD Consulting Physician Obstetrics and Gynecology  04/23/16   Neysa Reggy BIRCH, MD Consulting Physician Pulmonary Disease  04/23/16   Abran Norleen SAILOR, MD Consulting Physician Gastroenterology  04/23/16   Connee Nest, PA-C (Inactive)  Dermatology  05/30/20   [facility]    11/17/23     Chief Complaint  Patient presents with   Annual Exam    [facility]; pt is not fasting    Subjective: [name] is a 38 y.o.  Female  present for CPE and chronic condition management combined appointment All past medical history, surgical history, allergies, family history, immunizations, medications and social history were updated in the electronic medical record today. All recent labs, ED visits and hospitalizations within the last year were reviewed.  Health maintenance:  Colon cancer screen: adopted. Routine screen at 45.  Breast cancer screen: adopted. Routine screen at 40 Cervical cancer screening: last pap: UTD 2021- GYN Immunizations: tdap UTD 10/2021, Influenza-administered today encouraged yearly), PNA series completed Infectious disease screening: HIV and Hep C completed DEXA: routine screen Patient has a Dental home. Hospitalizations/ED visits: reviewed  Diabetes:  Diabetes is managed by endocrine for dm management   HLD/overweight: Pt reports compliance with tricor  145 mg daily and Lipitor 10 mg nightly     11/17/2023    8:01 AM 11/12/2022    1:57 PM 10/22/2022    3:13 PM 10/21/2021    8:04 AM 11/20/2020    1:25 PM  Depression screen PHQ 2/9  Decreased Interest 0 0 0 0 0  Down, Depressed, Hopeless 0 0 0 0 0  PHQ - 2 Score 0 0 0 0 0      11/12/2022    1:57 PM  GAD 7 :  Generalized Anxiety Score  Nervous, Anxious, on Edge 1  Control/stop worrying 3  Worry too much - different things 0  Trouble relaxing 1  Restless 3  Easily annoyed or irritable 1  Afraid - awful might happen 1  Total GAD 7 Score 10     Immunization History  Administered Date(s) Administered   Influenza Inj Mdck Quad With Preservative 08/19/2018   Influenza Whole 08/19/2009   Influenza, Seasonal, Injecte, Preservative Fre 11/17/2023   Influenza,inj,Quad PF,6+ Mos 07/31/2016, 08/02/2019   Influenza-Unspecified 08/11/2015, 07/12/2017, 07/11/2020, 10/18/2021, 08/10/2022   Moderna Covid-19 Vaccine Bivalent Booster 49yrs & up 10/18/2021   Moderna Sars-Covid-2 Vaccination 01/07/2020, 02/04/2020, 09/19/2020   Pneumococcal Conjugate-13 11/06/2016   Pneumococcal Polysaccharide-23 07/23/2007, 06/09/2018   Td 09/11/2004   Tdap 07/04/2012, 10/21/2021     Past Medical History:  Diagnosis Date   Allergy    Anxiety    ASTHMA 11/19/2007   Asthma    CHICKENPOX 11/19/2007   Diabetes mellitus without complication (HCC)    diet controlled   Duodenitis    Dysmenorrhea 11/19/2007   DYSPHAGIA 11/19/2010   ECZEMA 11/19/2007   Esophageal stricture    Esophagitis    FATTY LIVER DISEASE 04/17/2008   GERD 11/20/2010   Hyperlipidemia    IBS (irritable bowel syndrome)    Infection of eyelid 08/07/2011   Low vitamin D  level 2018   Other and unspecified noninfectious gastroenteritis and colitis(558.9) 02/2014   RHINOSINUSITIS,  CHRONIC 12/09/2007   TB SKIN TEST, POSITIVE 11/19/2007   Allergies  Allergen Reactions   Vancomycin Other (See Comments)    redmans syndrome    Januvia  [Sitagliptin ] Other (See Comments)    Dizziness   Past Surgical History:  Procedure Laterality Date   CT SINUS LTD W/O CM  09/23/2007   Mole excision     x's 4   Family History  Adopted: Yes   Social History   Social History Narrative   Adopted from [facility].    College Restaurant Manager, [employer] in [employer]   Single. No children.   Drinks caffeine, uses herbal remedies   Wears her seatbelt, wears bicycle helmet   Reports routine exercise    Smoke detector in the home.   Feels safe in relationships.      Allergies as of 11/17/2023       Reactions   Vancomycin Other (See Comments)   redmans syndrome    Januvia  [sitagliptin ] Other (See Comments)   Dizziness        Medication List        Accurate as of November 17, 2023 11:42 AM. If you have any questions, ask your nurse or doctor.          STOP taking these medications    amoxicillin -clavulanate 875-125 MG tablet Commonly known as: AUGMENTIN  Stopped by: Charlies Bellini   blood glucose meter kit and supplies Stopped by: Charlies Bellini   Contour Next One Kit Stopped by: Charlies Bellini   Contour Next Test test strip Generic drug: glucose blood Stopped by: Charlies Bellini   doxycycline  100 MG capsule Commonly known as: VIBRAMYCIN  Stopped by: Charlies Bellini   FreeStyle Libre 3 Sensor Misc Stopped by: Charlies Bellini       TAKE these medications    albuterol  108 (90 Base) MCG/ACT inhaler Commonly known as: VENTOLIN  HFA Inhale 2 puffs into the lungs every 6 (six) hours as needed for wheezing or shortness of breath. Refill upon request only. Can be filled with any albuterol  on formulary.   atorvastatin  10 MG tablet Commonly known as: LIPITOR Take 1 tablet (10 mg total) by mouth daily.   EPINEPHrine  0.3 mg/0.3 mL Soaj injection Commonly known as: EPI-PEN Inject 0.3 mg into the muscle once.   fenofibrate  145 MG tablet Commonly known as: TRICOR  Take 1 tablet (145 mg total) by mouth daily.   levocetirizine 5 MG tablet Commonly known as: XYZAL  TAKE 1 TABLET BY MOUTH EVERY DAY IN THE EVENING   montelukast 10 MG tablet Commonly known as: SINGULAIR every evening.   Symbicort  160-4.5 MCG/ACT inhaler Generic drug: budesonide -formoterol  Inhale 2 puffs into the lungs 2 (two) times daily.   Synjardy  XR  12.03-999 MG Tb24 Generic drug: Empagliflozin -metFORMIN  HCl ER Take 2 tablets by mouth daily.        All past medical history, surgical history, allergies, family history, immunizations andmedications were updated in the EMR today and reviewed under the history and medication portions of their EMR.     ROS 14 pt review of systems performed and negative (unless mentioned in an HPI)  Objective: BP 100/64   Pulse 69   Temp 97.7 F (36.5 C)   Ht 5' 4.17 (1.63 m)   Wt 143 lb 12.8 oz (65.2 kg)   SpO2 98%   BMI 24.55 kg/m  Physical Exam Constitutional:      General: She is not in acute distress.    Appearance: Normal appearance. She is not ill-appearing or  toxic-appearing.  HENT:     Head: Normocephalic and atraumatic.     Comments: Patchy areas of alopecia present    Right Ear: Tympanic membrane, ear canal and external ear normal. There is no impacted cerumen.     Left Ear: Tympanic membrane, ear canal and external ear normal. There is no impacted cerumen.     Nose: No congestion or rhinorrhea.     Mouth/Throat:     Mouth: Mucous membranes are moist.     Pharynx: Oropharynx is clear. No oropharyngeal exudate or posterior oropharyngeal erythema.  Eyes:     General: No scleral icterus.       Right eye: No discharge.        Left eye: No discharge.     Extraocular Movements: Extraocular movements intact.     Conjunctiva/sclera: Conjunctivae normal.     Pupils: Pupils are equal, round, and reactive to light.  Neck:     Comments: Mild b/l submandibular gland enlargement Cardiovascular:     Rate and Rhythm: Normal rate and regular rhythm.     Pulses: Normal pulses.     Heart sounds: Normal heart sounds. No murmur heard.    No friction rub. No gallop.  Pulmonary:     Effort: Pulmonary effort is normal. No respiratory distress.     Breath sounds: Normal breath sounds. No stridor. No wheezing, rhonchi or rales.  Chest:     Chest wall: No tenderness.  Abdominal:     General:  Abdomen is flat. Bowel sounds are normal. There is no distension.     Palpations: Abdomen is soft. There is no mass.     Tenderness: There is no abdominal tenderness. There is no right CVA tenderness, left CVA tenderness, guarding or rebound.     Hernia: No hernia is present.  Musculoskeletal:        General: No swelling, tenderness or deformity. Normal range of motion.     Cervical back: Normal range of motion and neck supple. No rigidity or tenderness.     Right lower leg: No edema.     Left lower leg: No edema.  Lymphadenopathy:     Cervical: No cervical adenopathy.  Skin:    General: Skin is warm and dry.     Coloration: Skin is not jaundiced or pale.     Findings: No bruising, erythema, lesion or rash.  Neurological:     General: No focal deficit present.     Mental Status: She is alert and oriented to person, place, and time. Mental status is at baseline.     Cranial Nerves: No cranial nerve deficit.     Sensory: No sensory deficit.     Motor: No weakness.     Coordination: Coordination normal.     Gait: Gait normal.     Deep Tendon Reflexes: Reflexes normal.  Psychiatric:        Mood and Affect: Mood normal.        Behavior: Behavior normal.        Thought Content: Thought content normal.        Judgment: Judgment normal.      Diabetic Foot Exam - Simple   Simple Foot Form Diabetic Foot exam was performed with the following findings: Yes 11/17/2023  8:00 AM  Visual Inspection No deformities, no ulcerations, no other skin breakdown bilaterally: Yes Sensation Testing Intact to touch and monofilament testing bilaterally: Yes Pulse Check Posterior Tibialis and Dorsalis pulse intact bilaterally: Yes Comments  No results found.  Assessment/plan: Skylah Delauter is a 38 y.o. female present for CPE  Diabetes mellitus 2 with hyperlipidemia/hypertriglycerides/overweight/fatty liver Managed by endocrine-currently prescribed Synjardy  Continue fenofibrate  145 mg  daily Continue Lipitor 10 mg nightly  Urine Microalbumin w/creat.:  Collected today 11/17/2023 - eye exam: [facility] on [address]. [facility] on [address]  - foot exam completed today 11/17/2023 - flu shot-administered today 11/17/2023 - PPSV 23 completed 06/09/18, prevnar 10/2016  -Labs: CMP, lipids and A1c collected today   Submandibular gland swelling: Mild bilateral submandibular gland swelling, nontender.  Does not appear enlarged past normal criteria at this time.  We discussed her monitoring and if they become more enlarged or blood work becomes abnormal, we would need to pursue further workup.  Patient reports she understands and agrees with plan.  Influenza vaccine needed Administered today  Vitamin D  deficiency Severe deficiency. Supplementing:not taking OTC.  Vitamin D  level of 9, last January.  Patient prescribed high-dose vitamin D .  Recommended follow-up in 3 months was not completed. Vitamin D  levels collected today  Routine general medical examination at a health care facility Patient was encouraged to exercise greater than 150 minutes a week. Patient was encouraged to choose a diet filled with fresh fruits and vegetables, and lean meats. AVS provided to patient today for education/recommendation on gender specific health and safety maintenance. Colonoscopy: adopted. Routine screen at 45.  Mammogram: adopted. Routine screen at 40 Cervical cancer screening: last pap: UTD 2021- GYN Immunizations: tdap UTD 10/2021, Influenza-administered today encouraged yearly), PNA series completed Infectious disease screening: HIV and Hep C completed DEXA: routine screen  Return in about 1 year (around 11/17/2024) for cpe (20 min), Routine chronic condition follow-up.  Orders Placed This Encounter  Procedures   Flu vaccine trivalent PF, 6mos and older(Flulaval,Afluria,Fluarix,Fluzone)   CBC   Comprehensive metabolic panel   Hemoglobin A1c   TSH    Lipid panel   Microalbumin / creatinine urine ratio   Vitamin D  (25 hydroxy)   Meds ordered this encounter  Medications   fenofibrate  (TRICOR ) 145 MG tablet    Sig: Take 1 tablet (145 mg total) by mouth daily.    Dispense:  90 tablet    Refill:  3   atorvastatin  (LIPITOR) 10 MG tablet    Sig: Take 1 tablet (10 mg total) by mouth daily.    Dispense:  90 tablet    Refill:  3   Referral Orders  No referral(s) requested today      Electronically signed by: Charlies Bellini, DO [facility]

## 2023-11-18 ENCOUNTER — Other Ambulatory Visit: Payer: Self-pay | Admitting: Family Medicine

## 2023-11-18 LAB — MICROALBUMIN / CREATININE URINE RATIO
Creatinine,U: 84.3 mg/dL
Microalb Creat Ratio: 1.3 mg/g (ref 0.0–30.0)
Microalb, Ur: 1.1 mg/dL (ref 0.0–1.9)

## 2023-11-18 MED ORDER — VITAMIN D (ERGOCALCIFEROL) 1.25 MG (50000 UNIT) PO CAPS: 50000.0000 [IU] | ORAL_CAPSULE | ORAL | 3 refills | Status: AC

## 2023-11-19 ENCOUNTER — Ambulatory Visit: Payer: 59 | Admitting: Endocrinology

## 2023-12-07 ENCOUNTER — Ambulatory Visit: Payer: 59 | Admitting: Endocrinology

## 2023-12-07 ENCOUNTER — Encounter: Payer: Self-pay | Admitting: Endocrinology

## 2023-12-07 VITALS — BP 100/60 | HR 74 | Resp 20 | Ht 64.0 in | Wt 140.6 lb

## 2023-12-07 DIAGNOSIS — E1165 Type 2 diabetes mellitus with hyperglycemia: Secondary | ICD-10-CM | POA: Diagnosis not present

## 2023-12-07 DIAGNOSIS — Z7985 Long-term (current) use of injectable non-insulin antidiabetic drugs: Secondary | ICD-10-CM

## 2023-12-07 MED ORDER — TIRZEPATIDE 2.5 MG/0.5ML ~~LOC~~ SOAJ: SUBCUTANEOUS | 4 refills | Status: DC

## 2023-12-07 NOTE — Progress Notes (Signed)
Outpatient Endocrinology Note Brenda Jenella Craigie, MD  12/07/23  Patient's Name: Brenda Rosales    DOB: 08/07/1986    MRN: [id number]                                                    REASON OF VISIT: Follow up for type 2 diabetes mellitus  PCP: Felix Pacini A, DO  HISTORY OF PRESENT ILLNESS:   Brenda Rosales is a 38 y.o. old female with past medical history listed below, is here for follow up for type 2 diabetes mellitus.   Pertinent Diabetes History: Patient was diagnosed with type 2 diabetes mellitus in 2012.  Chronic Diabetes Complications : Retinopathy: no. Last ophthalmology exam was done on annually.  Nephropathy: no Peripheral neuropathy: no Coronary artery disease: no Stroke: no  Relevant comorbidities and cardiovascular risk factors: Obesity: no Body mass index is 24.13 kg/m.  Hypertension: no Hyperlipidemia. yes  Current / Home Diabetic regimen includes: Synjardy XR 12.03/999, 1 tablet 2 times a day.  Prior diabetic medications: She had tried to [facility] for about a month around December 2023, stopped due to backorder issues.  Glycemic data:   She has not been checking blood sugar at home.  She has freestyle libre 3 CGM has not restarted it.  Hypoglycemia: Patient has no hypoglycemic episodes. Patient has hypoglycemia awareness.  Factors modifying glucose control: 1.  Diabetic diet assessment: Small meals 2-3 meals a day.  2.  Staying active or exercising: Gym and walking.  3.  Medication compliance: compliant all of the time.  Interval history  Recent hemoglobin A1c 7.4%, worsening.  Patient has been taking Synjardy 1 tablet 2 times a day.  She has not been checking blood sugar lately.  She is working with her company to be able to use CGM at the work.  No other complaints today.  Results reviewed with acceptable lipids level.  Normal urine microalbumin creatinine ratio.  REVIEW OF SYSTEMS As per history of present illness.   PAST MEDICAL  HISTORY: Past Medical History:  Diagnosis Date   Allergy    Anxiety    ASTHMA 11/19/2007   Asthma    CHICKENPOX 11/19/2007   Diabetes mellitus without complication (HCC)    diet controlled   Duodenitis    Dysmenorrhea 11/19/2007   DYSPHAGIA 11/19/2010   ECZEMA 11/19/2007   Esophageal stricture    Esophagitis    FATTY LIVER DISEASE 04/17/2008   GERD 11/20/2010   Hyperlipidemia    IBS (irritable bowel syndrome)    Infection of eyelid 08/07/2011   Low vitamin D level 2018   Other and unspecified noninfectious gastroenteritis and colitis(558.9) 02/2014   RHINOSINUSITIS, CHRONIC 12/09/2007   TB SKIN TEST, POSITIVE 11/19/2007    PAST SURGICAL HISTORY: Past Surgical History:  Procedure Laterality Date   CT SINUS LTD W/O CM  09/23/2007   Mole excision     x's 4    ALLERGIES: Allergies  Allergen Reactions   Vancomycin Other (See Comments)    redmans syndrome    Januvia [Sitagliptin] Other (See Comments)    Dizziness    FAMILY HISTORY:  Family History  Adopted: Yes    SOCIAL HISTORY: Social History   Socioeconomic History   Marital status: Single    Spouse name: Not on file   Number of children: 0   Years of education:  Not on file   Highest education level: Bachelor's degree (e.g., BA, AB, BS)  Occupational History   Occupation: Magazine features editor: [employer]  Tobacco Use   Smoking status: Never   Smokeless tobacco: Never  Vaping Use   Vaping status: Never Used  Substance and Sexual Activity   Alcohol use: Yes    Comment: Occa   Drug use: No   Sexual activity: Yes    Partners: Male  Other Topics Concern   Not on file  Social History Narrative   Adopted from [place].    College Occupational psychologist in [employer]   Single. No children.   Drinks caffeine, uses herbal remedies   Wears her seatbelt, wears bicycle helmet   Reports routine exercise    Smoke detector in the home.   Feels safe in relationships.     Social  Drivers of Health   Financial Resource Strain: Medium Risk (10/20/2022)   Overall Financial Resource Strain (CARDIA)    Difficulty of Paying Living Expenses: Somewhat hard  Food Insecurity: No Food Insecurity (10/20/2022)   Hunger Vital Sign    Worried About Running Out of Food in the Last Year: Never true    Ran Out of Food in the Last Year: Never true  Transportation Needs: No Transportation Needs (10/20/2022)   PRAPARE - Administrator, Civil Service (Medical): No    Lack of Transportation (Non-Medical): No  Physical Activity: Insufficiently Active (10/20/2022)   Exercise Vital Sign    Days of Exercise per Week: 3 days    Minutes of Exercise per Session: 30 min  Stress: No Stress Concern Present (10/20/2022)   [employer] of Occupational Health - Occupational Stress Questionnaire    Feeling of Stress : Not at all  Social Connections: Moderately Isolated (10/20/2022)   Social Connection and Isolation Panel [NHANES]    Frequency of Communication with Friends and Family: More than three times a week    Frequency of Social Gatherings with Friends and Family: More than three times a week    Attends Religious Services: Never    Database administrator or Organizations: Yes    Attends Engineer, structural: More than 4 times per year    Marital Status: Never married    MEDICATIONS:  Current Outpatient Medications  Medication Sig Dispense Refill   albuterol (VENTOLIN HFA) 108 (90 Base) MCG/ACT inhaler Inhale 2 puffs into the lungs every 6 (six) hours as needed for wheezing or shortness of breath. Refill upon request only. Can be filled with any albuterol on formulary. 8 g 11   atorvastatin (LIPITOR) 10 MG tablet Take 1 tablet (10 mg total) by mouth daily. 90 tablet 3   Empagliflozin-metFORMIN HCl ER (SYNJARDY XR) 12.03-999 MG TB24 Take 2 tablets by mouth daily. 60 tablet 5   EPINEPHrine 0.3 mg/0.3 mL IJ SOAJ injection Inject 0.3 mg into the muscle once.      fenofibrate (TRICOR) 145 MG tablet Take 1 tablet (145 mg total) by mouth daily. 90 tablet 3   levocetirizine (XYZAL) 5 MG tablet TAKE 1 TABLET BY MOUTH EVERY DAY IN THE EVENING 90 tablet 0   montelukast (SINGULAIR) 10 MG tablet every evening.     SYMBICORT 160-4.5 MCG/ACT inhaler Inhale 2 puffs into the lungs 2 (two) times daily.  3   tirzepatide (MOUNJARO) 2.5 MG/0.5ML Pen Inject 2.5 mg into the skin once a week for 4 weeks and then increase to 5  mg weekly. 2 mL 4   Vitamin D, Ergocalciferol, (DRISDOL) 1.25 MG (50000 UNIT) CAPS capsule Take 1 capsule (50,000 Units total) by mouth every 7 (seven) days. 12 capsule 3   No current facility-administered medications for this visit.    PHYSICAL EXAM: Vitals:   12/07/23 1516  BP: 100/60  Pulse: 74  Resp: 20  SpO2: 97%  Weight: 140 lb 9.6 oz (63.8 kg)  Height: 5\' 4"  (1.626 m)   Body mass index is 24.13 kg/m.  Wt Readings from Last 3 Encounters:  12/07/23 140 lb 9.6 oz (63.8 kg)  11/17/23 143 lb 12.8 oz (65.2 kg)  08/14/23 140 lb 3.2 oz (63.6 kg)    General: Well developed, well nourished female in no apparent distress.  HEENT: AT/[place], no external lesions.  Eyes: Conjunctiva clear and no icterus. Neck: Neck supple  Lungs: Respirations not labored Neurologic: Alert, oriented, normal speech Extremities / Skin: Dry.   Psychiatric: Does not appear depressed or anxious  Diabetic Foot Exam - Simple   No data filed    LABS Reviewed Lab Results  Component Value Date   HGBA1C 7.4 (H) 11/17/2023   HGBA1C 6.7 (H) 08/07/2023   HGBA1C 7.0 (H) 03/24/2023   No results found for: "FRUCTOSAMINE" Lab Results  Component Value Date   CHOL 201 (H) 11/17/2023   HDL 51.40 11/17/2023   LDLCALC 83 11/17/2023   LDLDIRECT 142.0 04/27/2019   TRIG 335.0 (H) 11/17/2023   CHOLHDL 4 11/17/2023   Lab Results  Component Value Date   MICRALBCREAT 1.3 11/17/2023   MICRALBCREAT 1.0 11/12/2022   Lab Results  Component Value Date   CREATININE 0.64  11/17/2023   Lab Results  Component Value Date   GFR 112.52 11/17/2023    ASSESSMENT / PLAN  1. Uncontrolled type 2 diabetes mellitus with hyperglycemia (HCC)     Diabetes Mellitus type 2, complicated by no known complications. - Diabetic status / severity: Controlled.  Lab Results  Component Value Date   HGBA1C 7.4 (H) 11/17/2023    - Hemoglobin A1c goal : <7%  Patient has uncontrolled diabetes mellitus on current medication.  I would like to start GLP-1 receptor agonist.  - Medications:.  I) Synjardy 12.03/999 mg 1 tablet 2 times a day. II) start Mounjaro 2.5 milligram weekly and increase to 5 mg weekly.  - Home glucose testing: Use freestyle libre 3 CGM or check blood sugar in the morning fasting and at bedtime at least few times a week. - Discussed/ Gave Hypoglycemia treatment plan.  # Consult : not required at this time.   # Annual urine for microalbuminuria/ creatinine ratio, no microalbuminuria currently.  Last  Lab Results  Component Value Date   MICRALBCREAT 1.3 11/17/2023    # Foot check nightly.  # Annual dilated diabetic eye exams.   - Diet: Make healthy diabetic food choices - Life style / activity / exercise: Discussed.  2. Blood pressure  -  BP Readings from Last 1 Encounters:  12/07/23 100/60    - Control is in target.  - No change in current plans.  3. Lipid status / Hyperlipidemia - Last  Lab Results  Component Value Date   LDLCALC 83 11/17/2023   - Continue atorvastatin 10 mg daily and fenofibrate 145 mg daily.  Diagnoses and all orders for this visit:  Uncontrolled type 2 diabetes mellitus with hyperglycemia (HCC) -     tirzepatide (MOUNJARO) 2.5 MG/0.5ML Pen; Inject 2.5 mg into the skin once a week for 4  weeks and then increase to 5 mg weekly.     DISPOSITION Follow up in clinic in 3 months suggested.  Labs on same day of the visit.  All questions answered and patient verbalized understanding of the plan.  Brenda Hai Grabe,  MD [facility] Endocr[facility] [address] [place], [address] [place], [place] [address] Phone # [phone]  At least part of this note was generated using voice recognition software. Inadvertent word errors may have occurred, which were not recognized during the proofreading process.

## 2023-12-13 ENCOUNTER — Encounter: Payer: Self-pay | Admitting: Endocrinology

## 2023-12-13 DIAGNOSIS — E1165 Type 2 diabetes mellitus with hyperglycemia: Secondary | ICD-10-CM

## 2023-12-14 ENCOUNTER — Other Ambulatory Visit: Payer: Self-pay | Admitting: Endocrinology

## 2023-12-14 ENCOUNTER — Other Ambulatory Visit: Payer: Self-pay

## 2023-12-14 DIAGNOSIS — E1165 Type 2 diabetes mellitus with hyperglycemia: Secondary | ICD-10-CM

## 2023-12-14 MED ORDER — SYNJARDY XR 12.5-1000 MG PO TB24: 1.0000 | ORAL_TABLET | Freq: Two times a day (BID) | ORAL | 3 refills | Status: DC

## 2023-12-14 MED ORDER — SYNJARDY XR 12.5-1000 MG PO TB24: 2.0000 | ORAL_TABLET | Freq: Two times a day (BID) | ORAL | 3 refills | Status: DC

## 2023-12-14 NOTE — Telephone Encounter (Signed)
Sent prescription for Synjardy to [facility].

## 2024-03-08 ENCOUNTER — Encounter: Payer: Self-pay | Admitting: Endocrinology

## 2024-03-08 ENCOUNTER — Ambulatory Visit: Payer: 59 | Admitting: Endocrinology

## 2024-03-08 VITALS — BP 110/60 | HR 72 | Resp 20 | Ht 64.0 in | Wt 136.2 lb

## 2024-03-08 DIAGNOSIS — E785 Hyperlipidemia, unspecified: Secondary | ICD-10-CM

## 2024-03-08 DIAGNOSIS — Z7985 Long-term (current) use of injectable non-insulin antidiabetic drugs: Secondary | ICD-10-CM | POA: Diagnosis not present

## 2024-03-08 DIAGNOSIS — E1165 Type 2 diabetes mellitus with hyperglycemia: Secondary | ICD-10-CM

## 2024-03-08 DIAGNOSIS — E119 Type 2 diabetes mellitus without complications: Secondary | ICD-10-CM | POA: Diagnosis not present

## 2024-03-08 LAB — POCT GLYCOSYLATED HEMOGLOBIN (HGB A1C): Hemoglobin A1C: 5.9 % — AB (ref 4.0–5.6)

## 2024-03-08 MED ORDER — TIRZEPATIDE 5 MG/0.5ML ~~LOC~~ SOAJ
5.0000 mg | SUBCUTANEOUS | 3 refills | Status: DC
Start: 2024-03-08 — End: 2024-06-23

## 2024-03-08 NOTE — Progress Notes (Signed)
 Outpatient Endocrinology Note Brenda Fern Asmar, MD  03/08/24  Patient's Name: Brenda Rosales    DOB: [birth year]    MRN: [id number]                                                    REASON OF VISIT: Follow up for type 2 diabetes mellitus  PCP: Napolean Backbone A, DO  HISTORY OF PRESENT ILLNESS:   Brenda Rosales is a 38 y.o. old female with past medical history listed below, is here for follow up for type 2 diabetes mellitus.   Pertinent Diabetes History: Patient was diagnosed with type 2 diabetes mellitus in 2012.  Chronic Diabetes Complications : Retinopathy: no. Last ophthalmology exam was done on annually.  Nephropathy: no Peripheral neuropathy: no Coronary artery disease: no Stroke: no  Relevant comorbidities and cardiovascular risk factors: Obesity: no Body mass index is 23.38 kg/m.  Hypertension: no Hyperlipidemia. yes  Current / Home Diabetic regimen includes: Synjardy  XR 12.03/999, 1 tablet 2 times a day. Mounjaro  2.5 mg weekly.  Prior diabetic medications: She had tried to Mounjaro  for about a month around December 2023, stopped due to backorder issues.  Glycemic data:   She has not been checking blood sugar at home.  She has freestyle libre 3 CGM has not started it.  Hypoglycemia: Patient has no hypoglycemic episodes. Patient has hypoglycemia awareness.  Factors modifying glucose control: 1.  Diabetic diet assessment: Small meals 2-3 meals a day.  2.  Staying active or exercising: Gym and walking.  3.  Medication compliance: compliant all of the time.  Interval history  Diabetes regimen as reviewed and noted above.  She denies any GI issues after being on Mounjaro .  She has been currently taking Mounjaro  2.5 mg weekly.  She lost about 8 pounds of weight in last 3 months, hemoglobin A1c improved from 7.4 to 5.9%.  No other complaints today.  REVIEW OF SYSTEMS As per history of present illness.   PAST MEDICAL HISTORY: Past Medical History:  Diagnosis  Date   Allergy    Anxiety    ASTHMA 11/19/2007   Asthma    CHICKENPOX 11/19/2007   Diabetes mellitus without complication (HCC)    diet controlled   Duodenitis    Dysmenorrhea 11/19/2007   DYSPHAGIA 11/19/2010   ECZEMA 11/19/2007   Esophageal stricture    Esophagitis    FATTY LIVER DISEASE 04/17/2008   GERD 11/20/2010   Hyperlipidemia    IBS (irritable bowel syndrome)    Infection of eyelid 08/07/2011   Low vitamin D  level 2018   Other and unspecified noninfectious gastroenteritis and colitis(558.9) 02/2014   RHINOSINUSITIS, CHRONIC 12/09/2007   TB SKIN TEST, POSITIVE 11/19/2007    PAST SURGICAL HISTORY: Past Surgical History:  Procedure Laterality Date   CT SINUS LTD W/O CM  09/23/2007   Mole excision     x's 4    ALLERGIES: Allergies  Allergen Reactions   Vancomycin Other (See Comments)    redmans syndrome    Januvia  [Sitagliptin ] Other (See Comments)    Dizziness    FAMILY HISTORY:  Family History  Adopted: Yes    SOCIAL HISTORY: Social History   Socioeconomic History   Marital status: Single    Spouse name: Not on file   Number of children: 0   Years of education: Not on file  Highest education level: Bachelor's degree (e.g., BA, AB, BS)  Occupational History   Occupation: Magazine features editor: [employer]  Tobacco Use   Smoking status: Never   Smokeless tobacco: Never  Vaping Use   Vaping status: Never Used  Substance and Sexual Activity   Alcohol use: Yes    Comment: Occa   Drug use: No   Sexual activity: Yes    Partners: Male  Other Topics Concern   Not on file  Social History Narrative   Adopted from [place].    College Occupational psychologist in [employer]   Single. No children.   Drinks caffeine, uses herbal remedies   Wears her seatbelt, wears bicycle helmet   Reports routine exercise    Smoke detector in the home.   Feels safe in relationships.     Social Drivers of Health   Financial Resource Strain:  Medium Risk (10/20/2022)   Overall Financial Resource Strain (CARDIA)    Difficulty of Paying Living Expenses: Somewhat hard  Food Insecurity: No Food Insecurity (10/20/2022)   Hunger Vital Sign    Worried About Running Out of Food in the Last Year: Never true    Ran Out of Food in the Last Year: Never true  Transportation Needs: No Transportation Needs (10/20/2022)   PRAPARE - Administrator, Civil Service (Medical): No    Lack of Transportation (Non-Medical): No  Physical Activity: Insufficiently Active (10/20/2022)   Exercise Vital Sign    Days of Exercise per Week: 3 days    Minutes of Exercise per Session: 30 min  Stress: No Stress Concern Present (10/20/2022)   [employer] of Occupational Health - Occupational Stress Questionnaire    Feeling of Stress : Not at all  Social Connections: Moderately Isolated (10/20/2022)   Social Connection and Isolation Panel [NHANES]    Frequency of Communication with Friends and Family: More than three times a week    Frequency of Social Gatherings with Friends and Family: More than three times a week    Attends Religious Services: Never    Database administrator or Organizations: Yes    Attends Engineer, structural: More than 4 times per year    Marital Status: Never married    MEDICATIONS:  Current Outpatient Medications  Medication Sig Dispense Refill   albuterol  (VENTOLIN  HFA) 108 (90 Base) MCG/ACT inhaler Inhale 2 puffs into the lungs every 6 (six) hours as needed for wheezing or shortness of breath. Refill upon request only. Can be filled with any albuterol  on formulary. 8 g 11   atorvastatin  (LIPITOR) 10 MG tablet Take 1 tablet (10 mg total) by mouth daily. 90 tablet 3   Empagliflozin -metFORMIN  HCl ER (SYNJARDY  XR) 12.03-999 MG TB24 Take 1 tablet by mouth in the morning and at bedtime. 180 tablet 3   EPINEPHrine  0.3 mg/0.3 mL IJ SOAJ injection Inject 0.3 mg into the muscle once.     fenofibrate  (TRICOR ) 145 MG  tablet Take 1 tablet (145 mg total) by mouth daily. 90 tablet 3   levocetirizine (XYZAL ) 5 MG tablet TAKE 1 TABLET BY MOUTH EVERY DAY IN THE EVENING 90 tablet 0   montelukast (SINGULAIR) 10 MG tablet every evening.     SYMBICORT  160-4.5 MCG/ACT inhaler Inhale 2 puffs into the lungs 2 (two) times daily.  3   tirzepatide  (MOUNJARO ) 5 MG/0.5ML Pen Inject 5 mg into the skin once a week. 6 mL 3   Vitamin D , Ergocalciferol , (  DRISDOL ) 1.25 MG (50000 UNIT) CAPS capsule Take 1 capsule (50,000 Units total) by mouth every 7 (seven) days. 12 capsule 3   No current facility-administered medications for this visit.    PHYSICAL EXAM: Vitals:   03/08/24 0809  BP: 110/60  Pulse: 72  Resp: 20  SpO2: 99%  Weight: 136 lb 3.2 oz (61.8 kg)  Height: 5\' 4"  (1.626 m)    Body mass index is 23.38 kg/m.  Wt Readings from Last 3 Encounters:  03/08/24 136 lb 3.2 oz (61.8 kg)  12/07/23 140 lb 9.6 oz (63.8 kg)  11/17/23 143 lb 12.8 oz (65.2 kg)    General: Well developed, well nourished female in no apparent distress.  HEENT: AT/[place], no external lesions.  Eyes: Conjunctiva clear and no icterus. Neck: Neck supple  Lungs: Respirations not labored Neurologic: Alert, oriented, normal speech Extremities / Skin: Dry.   Psychiatric: Does not appear depressed or anxious  Diabetic Foot Exam - Simple   No data filed    LABS Reviewed Lab Results  Component Value Date   HGBA1C 5.9 (A) 03/08/2024   HGBA1C 7.4 (H) 11/17/2023   HGBA1C 6.7 (H) 08/07/2023   No results found for: "FRUCTOSAMINE" Lab Results  Component Value Date   CHOL 201 (H) 11/17/2023   HDL 51.40 11/17/2023   LDLCALC 83 11/17/2023   LDLDIRECT 142.0 04/27/2019   TRIG 335.0 (H) 11/17/2023   CHOLHDL 4 11/17/2023   Lab Results  Component Value Date   MICRALBCREAT 1.3 11/17/2023   MICRALBCREAT 1.0 11/12/2022   Lab Results  Component Value Date   CREATININE 0.64 11/17/2023   Lab Results  Component Value Date   GFR 112.52 11/17/2023     ASSESSMENT / PLAN  1. Type 2 diabetes mellitus with hyperglycemia, without long-term current use of insulin (HCC)   2. Controlled type 2 diabetes mellitus without complication, without long-term current use of insulin (HCC)      Diabetes Mellitus type 2, complicated by no known complications. - Diabetic status / severity: Controlled.  Lab Results  Component Value Date   HGBA1C 5.9 (A) 03/08/2024    - Hemoglobin A1c goal : <7%  Congratulated her for improvement of diabetes control.  Adjusted diabetes regimen as follows.   - Medications:.  I) Synjardy  12.03/999 mg 1 tablet 2 times a day. II) increase Mounjaro  from 2.5 to 5 mg weekly.  Would likely stay on Mounjaro  5 mg weekly (not higher) unless diabetes control worsened, to avoid continued weight loss.  - Home glucose testing: Use freestyle libre 3 CGM or check blood sugar in the morning fasting and at bedtime at least few times a week. - Discussed/ Gave Hypoglycemia treatment plan.  # Consult : not required at this time.   # Annual urine for microalbuminuria/ creatinine ratio, no microalbuminuria currently.  Last  Lab Results  Component Value Date   MICRALBCREAT 1.3 11/17/2023    # Foot check nightly.  # Annual dilated diabetic eye exams.   - Diet: Make healthy diabetic food choices - Life style / activity / exercise: Discussed.  2. Blood pressure  -  BP Readings from Last 1 Encounters:  03/08/24 110/60    - Control is in target.  - No change in current plans.  3. Lipid status / Hyperlipidemia - Last  Lab Results  Component Value Date   LDLCALC 83 11/17/2023   - Continue atorvastatin  10 mg daily and fenofibrate  145 mg daily.  Diagnoses and all orders for this visit:  Type  2 diabetes mellitus with hyperglycemia, without long-term current use of insulin (HCC) -     POCT glycosylated hemoglobin (Hb A1C)  Controlled type 2 diabetes mellitus without complication, without long-term current use of  insulin (HCC) -     tirzepatide  (MOUNJARO ) 5 MG/0.5ML Pen; Inject 5 mg into the skin once a week.    DISPOSITION Follow up in clinic in 3 months suggested.   All questions answered and patient verbalized understanding of the plan.  Brenda Kiernan Atkerson, MD [facility] Endocrinology [facility] [address] [place], [address] [place], [place] [address] Phone # [phone]  At least part of this note was generated using voice recognition software. Inadvertent word errors may have occurred, which were not recognized during the proofreading process.

## 2024-03-08 NOTE — Patient Instructions (Signed)
 Latest Reference Range & Units 10/14/22 08:33 03/24/23 08:49 08/07/23 08:34 11/17/23 08:02 03/08/24 08:12  Hemoglobin A1C 4.0 - 5.6 % -  7.1 (H) 7.0 (H) 6.7 (H) 7.4 (H) 5.9 ! Pend  (H): Data is abnormally high !: Data is abnormal

## 2024-03-14 ENCOUNTER — Encounter: Payer: Self-pay | Admitting: Family Medicine

## 2024-04-22 ENCOUNTER — Other Ambulatory Visit (HOSPITAL_COMMUNITY)
Admission: RE | Admit: 2024-04-22 | Discharge: 2024-04-22 | Disposition: A | Discharge status: Home / Self Care | Payer: Self-pay | Source: Ambulatory Visit | Attending: Medical Genetics | Admitting: Medical Genetics

## 2024-05-02 LAB — GENECONNECT MOLECULAR SCREEN: Genetic Analysis Overall Interpretation: NEGATIVE

## 2024-06-20 ENCOUNTER — Inpatient Hospital Stay (HOSPITAL_COMMUNITY)
Admission: EM | Admit: 2024-06-20 | Discharge: 2024-06-23 | DRG: 392 | Disposition: A | Discharge status: Home / Self Care | Attending: Internal Medicine | Admitting: Internal Medicine

## 2024-06-20 ENCOUNTER — Emergency Department (HOSPITAL_COMMUNITY)

## 2024-06-20 ENCOUNTER — Encounter: Payer: Self-pay | Admitting: Endocrinology

## 2024-06-20 ENCOUNTER — Other Ambulatory Visit: Payer: Self-pay

## 2024-06-20 ENCOUNTER — Encounter (HOSPITAL_COMMUNITY): Payer: Self-pay

## 2024-06-20 DIAGNOSIS — K580 Irritable bowel syndrome with diarrhea: Secondary | ICD-10-CM | POA: Diagnosis present

## 2024-06-20 DIAGNOSIS — K760 Fatty (change of) liver, not elsewhere classified: Secondary | ICD-10-CM | POA: Diagnosis present

## 2024-06-20 DIAGNOSIS — E872 Acidosis, unspecified: Secondary | ICD-10-CM | POA: Diagnosis present

## 2024-06-20 DIAGNOSIS — E860 Dehydration: Secondary | ICD-10-CM | POA: Diagnosis present

## 2024-06-20 DIAGNOSIS — Z7985 Long-term (current) use of injectable non-insulin antidiabetic drugs: Secondary | ICD-10-CM

## 2024-06-20 DIAGNOSIS — Z888 Allergy status to other drugs, medicaments and biological substances status: Secondary | ICD-10-CM

## 2024-06-20 DIAGNOSIS — R1110 Vomiting, unspecified: Secondary | ICD-10-CM | POA: Diagnosis present

## 2024-06-20 DIAGNOSIS — K5900 Constipation, unspecified: Secondary | ICD-10-CM | POA: Diagnosis present

## 2024-06-20 DIAGNOSIS — Z7951 Long term (current) use of inhaled steroids: Secondary | ICD-10-CM

## 2024-06-20 DIAGNOSIS — E1165 Type 2 diabetes mellitus with hyperglycemia: Secondary | ICD-10-CM

## 2024-06-20 DIAGNOSIS — E785 Hyperlipidemia, unspecified: Secondary | ICD-10-CM | POA: Diagnosis present

## 2024-06-20 DIAGNOSIS — Z881 Allergy status to other antibiotic agents status: Secondary | ICD-10-CM

## 2024-06-20 DIAGNOSIS — T50995A Adverse effect of other drugs, medicaments and biological substances, initial encounter: Secondary | ICD-10-CM | POA: Diagnosis present

## 2024-06-20 DIAGNOSIS — R112 Nausea with vomiting, unspecified: Secondary | ICD-10-CM | POA: Diagnosis not present

## 2024-06-20 DIAGNOSIS — F419 Anxiety disorder, unspecified: Secondary | ICD-10-CM | POA: Diagnosis present

## 2024-06-20 DIAGNOSIS — K2980 Duodenitis without bleeding: Secondary | ICD-10-CM | POA: Diagnosis present

## 2024-06-20 DIAGNOSIS — J45909 Unspecified asthma, uncomplicated: Secondary | ICD-10-CM | POA: Diagnosis present

## 2024-06-20 DIAGNOSIS — E876 Hypokalemia: Secondary | ICD-10-CM | POA: Diagnosis present

## 2024-06-20 DIAGNOSIS — D72829 Elevated white blood cell count, unspecified: Secondary | ICD-10-CM | POA: Diagnosis present

## 2024-06-20 DIAGNOSIS — Z79899 Other long term (current) drug therapy: Secondary | ICD-10-CM

## 2024-06-20 DIAGNOSIS — E119 Type 2 diabetes mellitus without complications: Secondary | ICD-10-CM | POA: Diagnosis present

## 2024-06-20 DIAGNOSIS — Z7984 Long term (current) use of oral hypoglycemic drugs: Secondary | ICD-10-CM

## 2024-06-20 LAB — BLOOD GAS, VENOUS
Acid-base deficit: 4.8 mmol/L — ABNORMAL HIGH (ref 0.0–2.0)
Bicarbonate: 19.7 mmol/L — ABNORMAL LOW (ref 20.0–28.0)
O2 Saturation: 55 %
Patient temperature: 37
pCO2, Ven: 34 mmHg — ABNORMAL LOW (ref 44–60)
pH, Ven: 7.37 (ref 7.25–7.43)
pO2, Ven: 31 mmHg — CL (ref 32–45)

## 2024-06-20 LAB — LIPASE, BLOOD: Lipase: 31 U/L (ref 11–51)

## 2024-06-20 LAB — CBC WITH DIFFERENTIAL/PLATELET
Abs Immature Granulocytes: 0.06 K/uL (ref 0.00–0.07)
Basophils Absolute: 0.1 K/uL (ref 0.0–0.1)
Basophils Relative: 1 %
Eosinophils Absolute: 0.4 K/uL (ref 0.0–0.5)
Eosinophils Relative: 4 %
HCT: 43.8 % (ref 36.0–46.0)
Hemoglobin: 15.2 g/dL — ABNORMAL HIGH (ref 12.0–15.0)
Immature Granulocytes: 1 %
Lymphocytes Relative: 15 %
Lymphs Abs: 1.7 K/uL (ref 0.7–4.0)
MCH: 30.8 pg (ref 26.0–34.0)
MCHC: 34.7 g/dL (ref 30.0–36.0)
MCV: 88.8 fL (ref 80.0–100.0)
Monocytes Absolute: 0.4 K/uL (ref 0.1–1.0)
Monocytes Relative: 4 %
Neutro Abs: 9 K/uL — ABNORMAL HIGH (ref 1.7–7.7)
Neutrophils Relative %: 75 %
Platelets: 384 K/uL (ref 150–400)
RBC: 4.93 MIL/uL (ref 3.87–5.11)
RDW: 12.2 % (ref 11.5–15.5)
WBC: 11.7 K/uL — ABNORMAL HIGH (ref 4.0–10.5)
nRBC: 0 % (ref 0.0–0.2)

## 2024-06-20 LAB — URINALYSIS, ROUTINE W REFLEX MICROSCOPIC
Bacteria, UA: NONE SEEN
Bilirubin Urine: NEGATIVE
Glucose, UA: >=500 mg/dL — AB
Ketones, ur: 20 mg/dL — AB
Leukocytes,Ua: NEGATIVE
Nitrite: NEGATIVE
Protein, ur: NEGATIVE mg/dL
Specific Gravity, Urine: 1.028 (ref 1.005–1.030)
pH: 7 (ref 5.0–8.0)

## 2024-06-20 LAB — MAGNESIUM: Magnesium: 2 mg/dL (ref 1.7–2.4)

## 2024-06-20 LAB — COMPREHENSIVE METABOLIC PANEL WITH GFR
ALT: 19 U/L (ref 0–44)
AST: 27 U/L (ref 15–41)
Albumin: 4.6 g/dL (ref 3.5–5.0)
Alkaline Phosphatase: 35 U/L — ABNORMAL LOW (ref 38–126)
Anion gap: 15 (ref 5–15)
BUN: 15 mg/dL (ref 6–20)
CO2: 18 mmol/L — ABNORMAL LOW (ref 22–32)
Calcium: 9.8 mg/dL (ref 8.9–10.3)
Chloride: 103 mmol/L (ref 98–111)
Creatinine, Ser: 0.73 mg/dL (ref 0.44–1.00)
GFR, Estimated: >60 mL/min (ref >60)
Glucose, Bld: 193 mg/dL — ABNORMAL HIGH (ref 70–99)
Potassium: 3.4 mmol/L — ABNORMAL LOW (ref 3.5–5.1)
Sodium: 136 mmol/L (ref 135–145)
Total Bilirubin: 1.1 mg/dL (ref 0.0–1.2)
Total Protein: 8.4 g/dL — ABNORMAL HIGH (ref 6.5–8.1)

## 2024-06-20 LAB — CBG MONITORING, ED: Glucose-Capillary: 155 mg/dL — ABNORMAL HIGH (ref 70–99)

## 2024-06-20 LAB — HCG, SERUM, QUALITATIVE: Preg, Serum: NEGATIVE

## 2024-06-20 MED ORDER — PANTOPRAZOLE SODIUM 40 MG IV SOLR
40.0000 mg | Freq: Every day | INTRAVENOUS | Status: DC
  Administered 2024-06-20 – 2024-06-22 (×6): 40 mg via INTRAVENOUS
  Filled 2024-06-20 (×3): qty 10

## 2024-06-20 MED ORDER — METOCLOPRAMIDE HCL 5 MG/ML IJ SOLN
10.0000 mg | Freq: Four times a day (QID) | INTRAMUSCULAR | Status: DC | PRN
  Administered 2024-06-20 – 2024-06-21 (×4): 10 mg via INTRAVENOUS
  Filled 2024-06-20 (×2): qty 2

## 2024-06-20 MED ORDER — ACETAMINOPHEN 650 MG RE SUPP: 650.0000 mg | Freq: Four times a day (QID) | RECTAL | Status: DC | PRN

## 2024-06-20 MED ORDER — POTASSIUM CHLORIDE 2 MEQ/ML IV SOLN
INTRAVENOUS | Status: DC
  Filled 2024-06-20 (×2): qty 1000

## 2024-06-20 MED ORDER — ONDANSETRON HCL 4 MG/2ML IJ SOLN: 4.0000 mg | Freq: Four times a day (QID) | INTRAMUSCULAR | Status: DC | PRN

## 2024-06-20 MED ORDER — LORAZEPAM 2 MG/ML IJ SOLN: 1.0000 mg | INTRAMUSCULAR | Status: DC | PRN

## 2024-06-20 MED ORDER — POTASSIUM CHLORIDE CRYS ER 20 MEQ PO TBCR
40.0000 meq | EXTENDED_RELEASE_TABLET | Freq: Once | ORAL | Status: AC
  Administered 2024-06-20 (×2): 40 meq via ORAL
  Filled 2024-06-20: qty 2

## 2024-06-20 MED ORDER — INSULIN ASPART 100 UNIT/ML IJ SOLN
0.0000 [IU] | Freq: Three times a day (TID) | INTRAMUSCULAR | Status: DC
  Administered 2024-06-22 (×2): 3 [IU] via SUBCUTANEOUS
  Filled 2024-06-20: qty 0.15

## 2024-06-20 MED ORDER — ALBUTEROL SULFATE (2.5 MG/3ML) 0.083% IN NEBU: 2.5000 mg | INHALATION_SOLUTION | RESPIRATORY_TRACT | Status: DC | PRN

## 2024-06-20 MED ORDER — ONDANSETRON HCL 4 MG/2ML IJ SOLN
4.0000 mg | Freq: Once | INTRAMUSCULAR | Status: AC
  Administered 2024-06-20 (×2): 4 mg via INTRAVENOUS
  Filled 2024-06-20: qty 2

## 2024-06-20 MED ORDER — MOUNJARO 2.5 MG/0.5ML ~~LOC~~ SOAJ: SUBCUTANEOUS | 3 refills | Status: DC

## 2024-06-20 MED ORDER — HYDROMORPHONE HCL 1 MG/ML IJ SOLN: 0.5000 mg | INTRAMUSCULAR | Status: DC | PRN

## 2024-06-20 MED ORDER — METOCLOPRAMIDE HCL 5 MG/ML IJ SOLN
10.0000 mg | Freq: Once | INTRAMUSCULAR | Status: AC
  Administered 2024-06-20 (×2): 10 mg via INTRAVENOUS
  Filled 2024-06-20: qty 2

## 2024-06-20 MED ORDER — LORAZEPAM 2 MG/ML IJ SOLN: 1.0000 mg | Freq: Four times a day (QID) | INTRAMUSCULAR | Status: DC | PRN

## 2024-06-20 MED ORDER — LACTATED RINGERS IV BOLUS
1000.0000 mL | Freq: Once | INTRAVENOUS | Status: AC
  Administered 2024-06-20 (×2): 1000 mL via INTRAVENOUS

## 2024-06-20 MED ORDER — IOHEXOL 300 MG/ML  SOLN
100.0000 mL | Freq: Once | INTRAMUSCULAR | Status: AC | PRN
  Administered 2024-06-20 (×2): 100 mL via INTRAVENOUS

## 2024-06-20 MED ORDER — INSULIN ASPART 100 UNIT/ML IJ SOLN
0.0000 [IU] | Freq: Every day | INTRAMUSCULAR | Status: DC
  Filled 2024-06-20: qty 0.05

## 2024-06-20 MED ORDER — PROCHLORPERAZINE EDISYLATE 10 MG/2ML IJ SOLN
5.0000 mg | Freq: Once | INTRAMUSCULAR | Status: AC
  Administered 2024-06-20 (×2): 5 mg via INTRAVENOUS
  Filled 2024-06-20: qty 2

## 2024-06-20 MED ORDER — FENTANYL CITRATE PF 50 MCG/ML IJ SOSY
50.0000 ug | PREFILLED_SYRINGE | Freq: Once | INTRAMUSCULAR | Status: AC
  Administered 2024-06-20 (×2): 50 ug via INTRAVENOUS
  Filled 2024-06-20: qty 1

## 2024-06-20 MED ORDER — HYDROMORPHONE HCL 1 MG/ML IJ SOLN
0.5000 mg | Freq: Once | INTRAMUSCULAR | Status: AC
  Administered 2024-06-20 (×2): 0.5 mg via INTRAVENOUS
  Filled 2024-06-20: qty 1

## 2024-06-20 MED ORDER — TRAZODONE HCL 50 MG PO TABS
25.0000 mg | ORAL_TABLET | Freq: Every evening | ORAL | Status: DC | PRN
  Administered 2024-06-21 (×2): 25 mg via ORAL
  Filled 2024-06-20: qty 1

## 2024-06-20 MED ORDER — SODIUM CHLORIDE 0.9 % IV SOLN
25.0000 mg | Freq: Once | INTRAVENOUS | Status: AC
  Administered 2024-06-20 (×2): 25 mg via INTRAVENOUS
  Filled 2024-06-20: qty 25

## 2024-06-20 MED ORDER — ONDANSETRON HCL 4 MG PO TABS: 4.0000 mg | ORAL_TABLET | Freq: Four times a day (QID) | ORAL | Status: DC | PRN

## 2024-06-20 MED ORDER — ACETAMINOPHEN 325 MG PO TABS: 650.0000 mg | ORAL_TABLET | Freq: Four times a day (QID) | ORAL | Status: DC | PRN

## 2024-06-20 MED ORDER — ENOXAPARIN SODIUM 40 MG/0.4ML IJ SOSY
40.0000 mg | PREFILLED_SYRINGE | Freq: Every day | INTRAMUSCULAR | Status: DC
  Administered 2024-06-20 – 2024-06-23 (×7): 40 mg via SUBCUTANEOUS
  Filled 2024-06-20 (×4): qty 0.4

## 2024-06-20 NOTE — ED Provider Notes (Signed)
 [place] EMERGENCY DEPARTMENT AT [facility] Provider Note   CSN: [id number] Arrival date & time: 06/20/24  1041     Patient presents with: Vomiting   Brenda Rosales is a 38 y.o. female.   HPI 38 year old female presents with vomiting.  Started around 8:30 AM and has been continuous.  She has also had some diarrhea and generalized abdominal pain.  She is vomiting yellow emesis.  She does not think she has had a fever.  For the past month or more she has been having episodes a couple times a week of vomiting but is typically only once.  She has associated this with an increased Mounjaro  dose.  She has been on Mounjaro  for about 3 months in total.  However it has never lasted this long, though the way she is feeling now is reminding her of many years ago when she had diverticulitis.  Prior to Admission medications   Medication Sig Start Date End Date Taking? Authorizing Provider  albuterol  (VENTOLIN  HFA) 108 (90 Base) MCG/ACT inhaler Inhale 2 puffs into the lungs every 6 (six) hours as needed for wheezing or shortness of breath. Refill upon request only. Can be filled with any albuterol  on formulary. Patient taking differently: Inhale 2 puffs into the lungs every 6 (six) hours as needed for wheezing or shortness of breath. 04/04/21  Yes Kuneff, Renee A, DO  atorvastatin  (LIPITOR) 10 MG tablet Take 1 tablet (10 mg total) by mouth daily. Patient taking differently: Take 10 mg by mouth at bedtime. 11/17/23  Yes Kuneff, Renee A, DO  clobetasol (TEMOVATE) 0.05 % external solution Apply 1 Application topically See admin instructions. Apply to affected areas of the scalp twice a day as directed   Yes [provider]  Empagliflozin -metFORMIN  HCl ER (SYNJARDY  XR) 12.03-999 MG TB24 Take 1 tablet by mouth in the morning and at bedtime. 12/14/23  Yes Thapa, [place], MD  EPINEPHrine  0.3 mg/0.3 mL IJ SOAJ injection Inject 0.3 mg into the muscle as needed for anaphylaxis.   Yes [provider]  fenofibrate  (TRICOR ) 145 MG tablet Take 1 tablet (145 mg total) by mouth daily. Patient taking differently: Take 145 mg by mouth at bedtime. 11/17/23  Yes Kuneff, Renee A, DO  fluticasone  (FLONASE ) 50 MCG/ACT nasal spray Place 1-2 sprays into both nostrils daily.   Yes [provider]  levocetirizine (XYZAL ) 5 MG tablet TAKE 1 TABLET BY MOUTH EVERY DAY IN THE EVENING Patient taking differently: Take 5 mg by mouth every evening. 02/23/19  Yes Kuneff, Renee A, DO  montelukast (SINGULAIR) 10 MG tablet Take 10 mg by mouth at bedtime. 06/20/19  Yes [provider]  SYMBICORT  160-4.5 MCG/ACT inhaler Inhale 2 puffs into the lungs 2 (two) times daily. 11/13/16  Yes [provider]  tirzepatide  (MOUNJARO ) 2.5 MG/0.5ML Pen Inject 2.5mg  into the skin weekly Patient taking differently: Inject 2.5 mg into the skin every Sunday. 06/20/24  Yes Thapa, [place], MD  Vitamin D , Ergocalciferol , (DRISDOL ) 1.25 MG (50000 UNIT) CAPS capsule Take 1 capsule (50,000 Units total) by mouth every 7 (seven) days. Patient taking differently: Take 50,000 Units by mouth every Wednesday. 11/18/23  Yes Kuneff, Renee A, DO  tirzepatide  (MOUNJARO ) 5 MG/0.5ML Pen Inject 5 mg into the skin once a week. Patient not taking: Reported on 06/20/2024 03/08/24   Thapa, [place], MD    Allergies: Vancomycin and Januvia  [sitagliptin ]    Review of Systems  Respiratory:  Negative for shortness of breath.   Cardiovascular:  Positive  for chest pain (when vomiting).  Gastrointestinal:  Positive for abdominal pain, diarrhea, nausea and vomiting.    Updated Vital Signs BP 124/68 (BP Location: Right Arm)   Pulse 70   Temp 97.6 F (36.4 C) (Oral)   Resp 20   SpO2 100%   Physical Exam Vitals and nursing note reviewed.  Constitutional:      Appearance: She is well-developed. She is diaphoretic.     Comments: Sitting up, unable to lay flat  HENT:     Head: Normocephalic and atraumatic.  Cardiovascular:      Rate and Rhythm: Normal rate and regular rhythm.     Heart sounds: Normal heart sounds.  Pulmonary:     Effort: Pulmonary effort is normal.     Breath sounds: Normal breath sounds.  Abdominal:     Palpations: Abdomen is soft.     Tenderness: There is abdominal tenderness (mild, generalized, worst in LUQ).  Skin:    General: Skin is warm.  Neurological:     Mental Status: She is alert.     (all labs ordered are listed, but only abnormal results are displayed) Labs Reviewed  COMPREHENSIVE METABOLIC PANEL WITH GFR - Abnormal; Notable for the following components:      Result Value   Potassium 3.4 (*)    CO2 18 (*)    Glucose, Bld 193 (*)    Total Protein 8.4 (*)    Alkaline Phosphatase 35 (*)    All other components within normal limits  CBC WITH DIFFERENTIAL/PLATELET - Abnormal; Notable for the following components:   WBC 11.7 (*)    Hemoglobin 15.2 (*)    Neutro Abs 9.0 (*)    All other components within normal limits  URINALYSIS, ROUTINE W REFLEX MICROSCOPIC - Abnormal; Notable for the following components:   Color, Urine STRAW (*)    Glucose, UA >=500 (*)    Hgb urine dipstick MODERATE (*)    Ketones, ur 20 (*)    All other components within normal limits  BLOOD GAS, VENOUS - Abnormal; Notable for the following components:   pCO2, Ven 34 (*)    pO2, Ven 31 (*)    Bicarbonate 19.7 (*)    Acid-base deficit 4.8 (*)    All other components within normal limits  CBG MONITORING, ED - Abnormal; Notable for the following components:   Glucose-Capillary 155 (*)    All other components within normal limits  LIPASE, BLOOD  HCG, SERUM, QUALITATIVE  MAGNESIUM    EKG: EKG Interpretation Date/Time:  Monday June 20 2024 13:03:21 EDT Ventricular Rate:  92 PR Interval:  143 QRS Duration:  94 QT Interval:  393 QTC Calculation: 487 R Axis:   93  Text Interpretation: Sinus rhythm Borderline right axis deviation Borderline prolonged QT interval no significant change since  2023 Confirmed by Freddi Hamilton [phone]) on 06/20/2024 1:17:04 PM  Radiology: CT ABDOMEN PELVIS W CONTRAST Result Date: 06/20/2024 CLINICAL DATA:  LLQ abdominal pain EXAM: CT ABDOMEN AND PELVIS WITH CONTRAST TECHNIQUE: Multidetector CT imaging of the abdomen and pelvis was performed using the standard protocol following bolus administration of intravenous contrast. RADIATION DOSE REDUCTION: This exam was performed according to the departmental dose-optimization program which includes automated exposure control, adjustment of the mA and/or kV according to patient size and/or use of iterative reconstruction technique. CONTRAST:  OMNIPAQUE  IOHEXOL  300 MG/ML  SOLN COMPARISON:  February 08, 2014 FINDINGS: Lower chest: No focal airspace consolidation or pleural effusion. Hepatobiliary: No mass.Diffuse hepatic steatosis.No radiopaque  stones or wall thickening of the gallbladder. No intrahepatic or extrahepatic biliary ductal dilation. The portal veins are patent. Pancreas: No mass or main ductal dilation. No peripancreatic inflammation or fluid collection. Spleen: Normal size. No mass. Adrenals/Urinary Tract: No adrenal masses. No renal mass. No nephrolithiasis or hydronephrosis. The urinary bladder is distended without focal abnormality. Stomach/Bowel: Small hiatal hernia. The stomach is decompressed without focal abnormality. No small bowel wall thickening or inflammation. No small bowel obstruction.Normal appendix. Vascular/Lymphatic: No aortic aneurysm. No intraabdominal or pelvic lymphadenopathy. Reproductive: The uterus and ovaries are within normal limits for patient's age.No free pelvic fluid. Other: No pneumoperitoneum, ascites, or mesenteric inflammation. Musculoskeletal: No acute fracture or destructive lesion. L5-S1 degenerative disc disease. IMPRESSION: 1. No acute intra-abdominal or pelvic abnormality. 2. Small hiatal hernia. Electronically Signed   By: Rogelia Myers M.D.   On: 06/20/2024 14:35   DG  Chest Portable 1 View Result Date: 06/20/2024 CLINICAL DATA:  chest pain, vomiting EXAM: PORTABLE CHEST - 1 VIEW COMPARISON:  October 18, 2022 FINDINGS: No focal airspace consolidation, pleural effusion, or pneumothorax. No cardiomegaly. No acute fracture or destructive lesion. IMPRESSION: No acute cardiopulmonary abnormality. Electronically Signed   By: Rogelia Myers M.D.   On: 06/20/2024 13:27     Procedures   Medications Ordered in the ED  promethazine  (PHENERGAN ) 25 mg in sodium chloride  0.9 % 50 mL IVPB (has no administration in time range)  enoxaparin  (LOVENOX ) injection 40 mg (has no administration in time range)  acetaminophen  (TYLENOL ) tablet 650 mg (has no administration in time range)    Or  acetaminophen  (TYLENOL ) suppository 650 mg (has no administration in time range)  traZODone  (DESYREL ) tablet 25 mg (has no administration in time range)  ondansetron  (ZOFRAN ) tablet 4 mg (has no administration in time range)    Or  ondansetron  (ZOFRAN ) injection 4 mg (has no administration in time range)  albuterol  (PROVENTIL ) (2.5 MG/3ML) 0.083% nebulizer solution 2.5 mg (has no administration in time range)  insulin  aspart (novoLOG ) injection 0-15 Units (has no administration in time range)  insulin  aspart (novoLOG ) injection 0-5 Units (has no administration in time range)  lactated ringers  1,000 mL with potassium chloride  20 mEq infusion (has no administration in time range)  ondansetron  (ZOFRAN ) injection 4 mg (4 mg Intravenous Given 06/20/24 1125)  lactated ringers  bolus 1,000 mL (0 mLs Intravenous Stopped 06/20/24 1518)  lactated ringers  bolus 1,000 mL (0 mLs Intravenous Stopped 06/20/24 1518)  fentaNYL  (SUBLIMAZE ) injection 50 mcg (50 mcg Intravenous Given 06/20/24 1204)  HYDROmorphone  (DILAUDID ) injection 0.5 mg (0.5 mg Intravenous Given 06/20/24 1341)  metoCLOPramide  (REGLAN ) injection 10 mg (10 mg Intravenous Given 06/20/24 1341)  iohexol  (OMNIPAQUE ) 300 MG/ML solution 100 mL (100 mLs  Intravenous Contrast Given 06/20/24 1317)  potassium chloride  SA (KLOR-CON  M) CR tablet 40 mEq (40 mEq Oral Given 06/20/24 1517)                                    Medical Decision Making Amount and/or Complexity of Data Reviewed Labs: ordered.    Details: Mildly low bicarbonate with anion gap 15 Radiology: ordered and independent interpretation performed.    Details: No diverticuli ECG/medicine tests: ordered and independent interpretation performed.    Details: No acute ischemia  Risk Prescription drug management. Decision regarding hospitalization.   Patient presents with vomiting and abdominal pain.  Initially symptoms were better with some pain and nausea control but then required  repeat dosing of antiemetics at this time gave Reglan .  Also gave more pain control.  CT is reassuring but her lab work does show some mild acidosis with a bicarb of 18 and anion gap of 15.  However her pH is normal.  Consider euglycemic DKA but I suspect with minimal ketones in the urine is probably more from the vomiting and dehydration itself.  However unfortunately she threw up/spit up after trying to swallow water and potassium.  She will be given more antiemetics but I do not think she is stable to send home.  Will need continued fluids/supportive care.  Discussed with Dr. Zella for admission.     Final diagnoses:  Intractable nausea and vomiting    ED Discharge Orders     None          Freddi Hamilton, MD 06/20/24 1605

## 2024-06-20 NOTE — ED Triage Notes (Signed)
 Pt reports with vomiting and diarrhea since this morning. Pt is sweating in triage.

## 2024-06-20 NOTE — ED Notes (Signed)
 Pt to CT

## 2024-06-20 NOTE — Plan of Care (Signed)
  Problem: Education: Goal: Ability to describe self-care measures that may prevent or decrease complications (Diabetes Survival Skills Education) will improve Outcome: Progressing Goal: Individualized Educational Video(s) Outcome: Progressing   Problem: Coping: Goal: Ability to adjust to condition or change in health will improve Outcome: Progressing   Problem: Fluid Volume: Goal: Ability to maintain a balanced intake and output will improve Outcome: Progressing   Problem: Health Behavior/Discharge Planning: Goal: Ability to identify and utilize available resources and services will improve Outcome: Progressing Goal: Ability to manage health-related needs will improve Outcome: Progressing   Problem: Metabolic: Goal: Ability to maintain appropriate glucose levels will improve Outcome: Progressing   Problem: Skin Integrity: Goal: Risk for impaired skin integrity will decrease Outcome: Progressing   Problem: Tissue Perfusion: Goal: Adequacy of tissue perfusion will improve Outcome: Progressing   Problem: Education: Goal: Knowledge of General Education information will improve Description: Including pain rating scale, medication(s)/side effects and non-pharmacologic comfort measures Outcome: Progressing   Problem: Health Behavior/Discharge Planning: Goal: Ability to manage health-related needs will improve Outcome: Progressing   Problem: Clinical Measurements: Goal: Ability to maintain clinical measurements within normal limits will improve Outcome: Progressing Goal: Will remain free from infection Outcome: Progressing   Problem: Activity: Goal: Risk for activity intolerance will decrease Outcome: Progressing   Problem: Pain Managment: Goal: General experience of comfort will improve and/or be controlled Outcome: Progressing

## 2024-06-20 NOTE — ED Notes (Signed)
 PO Challenge unsuccessful, Pt unable to hold in water

## 2024-06-20 NOTE — ED Notes (Addendum)
 Triage reported to me that they were unsuccessful in getting patients temperature orally and axillary so I got a rectal temperature and it was 95.5 I reported this to the nurse.

## 2024-06-20 NOTE — Telephone Encounter (Signed)
 This could be related to higher dose of Mounjaro .  Okay to decrease to Mounjaro  2.5 mg weekly.

## 2024-06-20 NOTE — ED Notes (Signed)
 Pt refused CBG check, states they already did it in triage.

## 2024-06-20 NOTE — ED Notes (Signed)
 Xray at beside

## 2024-06-20 NOTE — H&P (Signed)
 History and Physical  Brenda Rosales FMW:[id number] DOB: Jul 03, 1986 DOA: 06/20/2024  PCP: Catherine Charlies LABOR, DO   Chief Complaint: intractable vomiting   HPI: Brenda Rosales is a 38 y.o. female with medical history significant for IBS, esophagitis, diabetes be admitted to the hospital with intractable nausea and vomiting.  She states that she has been on Mounjaro  for over a year, 3 months ago dose was increased.  Since that time, she has been having intermittent constipation, vomiting approximately 2-3 times per week.  She took her last Mounjaro  shot yesterday, this morning she started having severe intractable nausea vomiting.  This is associated with some abdominal discomfort and sensation of bloating, but she denies frank pain.  Her main complaint is that she feels incredibly dehydrated, she knows this because she has some numbness and tingling in her lower extremities which is typical.  Workup in the ER including CT scan as detailed below is relatively unremarkable.  Review of Systems: Please see HPI for pertinent positives and negatives. A complete 10 system review of systems are otherwise negative.  Past Medical History:  Diagnosis Date   Allergy    Anxiety    ASTHMA 11/19/2007   Asthma    CHICKENPOX 11/19/2007   Diabetes mellitus without complication (HCC)    diet controlled   Duodenitis    Dysmenorrhea 11/19/2007   DYSPHAGIA 11/19/2010   ECZEMA 11/19/2007   Esophageal stricture    Esophagitis    FATTY LIVER DISEASE 04/17/2008   GERD 11/20/2010   Hyperlipidemia    IBS (irritable bowel syndrome)    Infection of eyelid 08/07/2011   Low vitamin D  level 2018   Other and unspecified noninfectious gastroenteritis and colitis(558.9) 02/2014   RHINOSINUSITIS, CHRONIC 12/09/2007   TB SKIN TEST, POSITIVE 11/19/2007   Past Surgical History:  Procedure Laterality Date   CT SINUS LTD W/O CM  09/23/2007   Mole excision     x's 4   Social History:  reports that she has never  smoked. She has never used smokeless tobacco. She reports current alcohol use. She reports that she does not use drugs.  Allergies  Allergen Reactions   Vancomycin Other (See Comments)    Red man syndrome, now more commonly referred to as Vancomycin infusion reaction (VIR)   Januvia  [Sitagliptin ] Other (See Comments)    Dizziness    Family History  Adopted: Yes     Prior to Admission medications   Medication Sig Start Date End Date Taking? Authorizing Provider  albuterol  (VENTOLIN  HFA) 108 (90 Base) MCG/ACT inhaler Inhale 2 puffs into the lungs every 6 (six) hours as needed for wheezing or shortness of breath. Refill upon request only. Can be filled with any albuterol  on formulary. Patient taking differently: Inhale 2 puffs into the lungs every 6 (six) hours as needed for wheezing or shortness of breath. 04/04/21  Yes Kuneff, Renee A, DO  atorvastatin  (LIPITOR) 10 MG tablet Take 1 tablet (10 mg total) by mouth daily. Patient taking differently: Take 10 mg by mouth at bedtime. 11/17/23  Yes Kuneff, Renee A, DO  clobetasol (TEMOVATE) 0.05 % external solution Apply 1 Application topically See admin instructions. Apply to affected areas of the scalp twice a day as directed   Yes [provider]  Empagliflozin -metFORMIN  HCl ER (SYNJARDY  XR) 12.03-999 MG TB24 Take 1 tablet by mouth in the morning and at bedtime. 12/14/23  Yes Thapa, [place], MD  EPINEPHrine  0.3 mg/0.3 mL IJ SOAJ injection Inject 0.3 mg into the muscle as needed  for anaphylaxis.   Yes [provider]  fenofibrate  (TRICOR ) 145 MG tablet Take 1 tablet (145 mg total) by mouth daily. Patient taking differently: Take 145 mg by mouth at bedtime. 11/17/23  Yes Kuneff, Renee A, DO  fluticasone  (FLONASE ) 50 MCG/ACT nasal spray Place 1-2 sprays into both nostrils daily.   Yes [provider]  levocetirizine (XYZAL ) 5 MG tablet TAKE 1 TABLET BY MOUTH EVERY DAY IN THE EVENING Patient taking differently: Take 5 mg by mouth  every evening. 02/23/19  Yes Kuneff, Renee A, DO  montelukast (SINGULAIR) 10 MG tablet Take 10 mg by mouth at bedtime. 06/20/19  Yes [provider]  SYMBICORT  160-4.5 MCG/ACT inhaler Inhale 2 puffs into the lungs 2 (two) times daily. 11/13/16  Yes [provider]  tirzepatide  (MOUNJARO ) 2.5 MG/0.5ML Pen Inject 2.5mg  into the skin weekly Patient taking differently: Inject 2.5 mg into the skin every Sunday. 06/20/24  Yes Thapa, [place], MD  Vitamin D , Ergocalciferol , (DRISDOL ) 1.25 MG (50000 UNIT) CAPS capsule Take 1 capsule (50,000 Units total) by mouth every 7 (seven) days. Patient taking differently: Take 50,000 Units by mouth every Wednesday. 11/18/23  Yes Kuneff, Renee A, DO  tirzepatide  (MOUNJARO ) 5 MG/0.5ML Pen Inject 5 mg into the skin once a week. Patient not taking: Reported on 06/20/2024 03/08/24   Thapa, [place], MD    Physical Exam: BP 124/68 (BP Location: Right Arm)   Pulse 70   Temp 97.6 F (36.4 C) (Oral)   Resp 20   SpO2 100%  General:  Alert, oriented, calm, in some mild distress.  She looks nontoxic.  Her mother is at the bedside.  Mucous membranes look dry. Cardiovascular: RRR, no murmurs or rubs, no peripheral edema  Respiratory: clear to auscultation bilaterally, no wheezes, no crackles  Abdomen: soft, nontender, nondistended, normal bowel tones heard  Skin: dry, no rashes  Musculoskeletal: no joint effusions, normal range of motion  Psychiatric: appropriate affect, normal speech  Neurologic: extraocular muscles intact, clear speech, moving all extremities with intact sensorium         Labs on Admission:  Basic Metabolic Panel: Recent Labs  Lab 06/20/24 1054  NA 136  K 3.4*  CL 103  CO2 18*  GLUCOSE 193*  BUN 15  CREATININE 0.73  CALCIUM  9.8   Liver Function Tests: Recent Labs  Lab 06/20/24 1054  AST 27  ALT 19  ALKPHOS 35*  BILITOT 1.1  PROT 8.4*  ALBUMIN 4.6   Recent Labs  Lab 06/20/24 1054  LIPASE 31   No results for input(s):  AMMONIA in the last 168 hours. CBC: Recent Labs  Lab 06/20/24 1054  WBC 11.7*  NEUTROABS 9.0*  HGB 15.2*  HCT 43.8  MCV 88.8  PLT 384   Cardiac Enzymes: No results for input(s): CKTOTAL, CKMB, CKMBINDEX, TROPONINI in the last 168 hours. BNP (last 3 results) No results for input(s): BNP in the last 8760 hours.  ProBNP (last 3 results) No results for input(s): PROBNP in the last 8760 hours.  CBG: Recent Labs  Lab 06/20/24 1052  GLUCAP 155*    Radiological Exams on Admission: CT ABDOMEN PELVIS W CONTRAST Result Date: 06/20/2024 CLINICAL DATA:  LLQ abdominal pain EXAM: CT ABDOMEN AND PELVIS WITH CONTRAST TECHNIQUE: Multidetector CT imaging of the abdomen and pelvis was performed using the standard protocol following bolus administration of intravenous contrast. RADIATION DOSE REDUCTION: This exam was performed according to the departmental dose-optimization program which includes automated exposure control, adjustment of the mA and/or  kV according to patient size and/or use of iterative reconstruction technique. CONTRAST:  OMNIPAQUE  IOHEXOL  300 MG/ML  SOLN COMPARISON:  February 08, 2014 FINDINGS: Lower chest: No focal airspace consolidation or pleural effusion. Hepatobiliary: No mass.Diffuse hepatic steatosis.No radiopaque stones or wall thickening of the gallbladder. No intrahepatic or extrahepatic biliary ductal dilation. The portal veins are patent. Pancreas: No mass or main ductal dilation. No peripancreatic inflammation or fluid collection. Spleen: Normal size. No mass. Adrenals/Urinary Tract: No adrenal masses. No renal mass. No nephrolithiasis or hydronephrosis. The urinary bladder is distended without focal abnormality. Stomach/Bowel: Small hiatal hernia. The stomach is decompressed without focal abnormality. No small bowel wall thickening or inflammation. No small bowel obstruction.Normal appendix. Vascular/Lymphatic: No aortic aneurysm. No intraabdominal or pelvic  lymphadenopathy. Reproductive: The uterus and ovaries are within normal limits for patient's age.No free pelvic fluid. Other: No pneumoperitoneum, ascites, or mesenteric inflammation. Musculoskeletal: No acute fracture or destructive lesion. L5-S1 degenerative disc disease. IMPRESSION: 1. No acute intra-abdominal or pelvic abnormality. 2. Small hiatal hernia. Electronically Signed   By: Rogelia Myers M.D.   On: 06/20/2024 14:35   DG Chest Portable 1 View Result Date: 06/20/2024 CLINICAL DATA:  chest pain, vomiting EXAM: PORTABLE CHEST - 1 VIEW COMPARISON:  October 18, 2022 FINDINGS: No focal airspace consolidation, pleural effusion, or pneumothorax. No cardiomegaly. No acute fracture or destructive lesion. IMPRESSION: No acute cardiopulmonary abnormality. Electronically Signed   By: Rogelia Myers M.D.   On: 06/20/2024 13:27   Assessment/Plan Brenda Rosales is a 38 y.o. female with medical history significant for IBS, esophagitis, diabetes be admitted to the hospital with intractable nausea and vomiting.   Intractable nausea and vomiting-unclear etiology, CT scan without inflammatory findings, acute infection, obstruction, etc.  Could potentially be due to Mounjaro , however dose was increased approximately 3 months ago. -Observation admission -Hydrate with LR -Pain and nausea medication as needed, IV Reglan  has been helpful -Clear liquid diet as tolerated  Type 2 diabetes-carb modified diet when advanced, with moderate dose sliding scale  Hypokalemia-potassium level is borderline, will hydrate gently with potassium containing fluids  Metabolic acidosis-without anion gap, likely from dehydration and vomiting  Leukocytosis-minimal, likely reactive from vomiting   DVT prophylaxis: Lovenox      Code Status: Full Code  Consults called: None  Admission status: Observation  Time spent: 49 minutes  Amyiah Gaba CHRISTELLA Gail MD [facility] Pager [phone]  If 7PM-7AM, please contact  night-coverage [email or web address] Password [email or web address]  06/20/2024, 4:05 PM

## 2024-06-20 NOTE — ED Notes (Addendum)
 Unable to get temp oraly Pt actively throwing up. Axillary temp not reading

## 2024-06-20 NOTE — ED Notes (Addendum)
 Pt reports recently being on Mounjaro  and having complications.

## 2024-06-21 DIAGNOSIS — Z888 Allergy status to other drugs, medicaments and biological substances status: Secondary | ICD-10-CM | POA: Diagnosis not present

## 2024-06-21 DIAGNOSIS — K760 Fatty (change of) liver, not elsewhere classified: Secondary | ICD-10-CM | POA: Diagnosis present

## 2024-06-21 DIAGNOSIS — D72829 Elevated white blood cell count, unspecified: Secondary | ICD-10-CM | POA: Diagnosis present

## 2024-06-21 DIAGNOSIS — K580 Irritable bowel syndrome with diarrhea: Secondary | ICD-10-CM | POA: Diagnosis present

## 2024-06-21 DIAGNOSIS — E785 Hyperlipidemia, unspecified: Secondary | ICD-10-CM | POA: Diagnosis present

## 2024-06-21 DIAGNOSIS — Z7984 Long term (current) use of oral hypoglycemic drugs: Secondary | ICD-10-CM | POA: Diagnosis not present

## 2024-06-21 DIAGNOSIS — Z79899 Other long term (current) drug therapy: Secondary | ICD-10-CM | POA: Diagnosis not present

## 2024-06-21 DIAGNOSIS — T50995A Adverse effect of other drugs, medicaments and biological substances, initial encounter: Secondary | ICD-10-CM | POA: Diagnosis present

## 2024-06-21 DIAGNOSIS — E860 Dehydration: Secondary | ICD-10-CM | POA: Diagnosis present

## 2024-06-21 DIAGNOSIS — Z881 Allergy status to other antibiotic agents status: Secondary | ICD-10-CM | POA: Diagnosis not present

## 2024-06-21 DIAGNOSIS — E872 Acidosis, unspecified: Secondary | ICD-10-CM | POA: Diagnosis present

## 2024-06-21 DIAGNOSIS — R112 Nausea with vomiting, unspecified: Secondary | ICD-10-CM | POA: Diagnosis present

## 2024-06-21 DIAGNOSIS — Z7951 Long term (current) use of inhaled steroids: Secondary | ICD-10-CM | POA: Diagnosis not present

## 2024-06-21 DIAGNOSIS — J45909 Unspecified asthma, uncomplicated: Secondary | ICD-10-CM | POA: Diagnosis present

## 2024-06-21 DIAGNOSIS — K2980 Duodenitis without bleeding: Secondary | ICD-10-CM | POA: Diagnosis present

## 2024-06-21 DIAGNOSIS — E119 Type 2 diabetes mellitus without complications: Secondary | ICD-10-CM | POA: Diagnosis present

## 2024-06-21 DIAGNOSIS — F419 Anxiety disorder, unspecified: Secondary | ICD-10-CM | POA: Diagnosis present

## 2024-06-21 DIAGNOSIS — Z7985 Long-term (current) use of injectable non-insulin antidiabetic drugs: Secondary | ICD-10-CM | POA: Diagnosis not present

## 2024-06-21 DIAGNOSIS — K5900 Constipation, unspecified: Secondary | ICD-10-CM | POA: Diagnosis present

## 2024-06-21 DIAGNOSIS — E876 Hypokalemia: Secondary | ICD-10-CM | POA: Diagnosis present

## 2024-06-21 DIAGNOSIS — R1110 Vomiting, unspecified: Secondary | ICD-10-CM | POA: Diagnosis not present

## 2024-06-21 LAB — BLOOD GAS, VENOUS
Acid-base deficit: 4.7 mmol/L — ABNORMAL HIGH (ref 0.0–2.0)
Bicarbonate: 19.5 mmol/L — ABNORMAL LOW (ref 20.0–28.0)
O2 Saturation: 80.3 %
Patient temperature: 37.3
pCO2, Ven: 33 mmHg — ABNORMAL LOW (ref 44–60)
pH, Ven: 7.38 (ref 7.25–7.43)
pO2, Ven: 47 mmHg — ABNORMAL HIGH (ref 32–45)

## 2024-06-21 LAB — BASIC METABOLIC PANEL WITH GFR
Anion gap: 16 — ABNORMAL HIGH (ref 5–15)
BUN: 9 mg/dL (ref 6–20)
CO2: 14 mmol/L — ABNORMAL LOW (ref 22–32)
Calcium: 8.6 mg/dL — ABNORMAL LOW (ref 8.9–10.3)
Chloride: 108 mmol/L (ref 98–111)
Creatinine, Ser: 0.62 mg/dL (ref 0.44–1.00)
GFR, Estimated: >60 mL/min (ref >60)
Glucose, Bld: 114 mg/dL — ABNORMAL HIGH (ref 70–99)
Potassium: 3.4 mmol/L — ABNORMAL LOW (ref 3.5–5.1)
Sodium: 138 mmol/L (ref 135–145)

## 2024-06-21 LAB — GLUCOSE, CAPILLARY
Glucose-Capillary: 93 mg/dL (ref 70–99)
Glucose-Capillary: 90 mg/dL (ref 70–99)
Glucose-Capillary: 93 mg/dL (ref 70–99)
Glucose-Capillary: 125 mg/dL — ABNORMAL HIGH (ref 70–99)
Glucose-Capillary: 111 mg/dL — ABNORMAL HIGH (ref 70–99)
Glucose-Capillary: 133 mg/dL — ABNORMAL HIGH (ref 70–99)

## 2024-06-21 LAB — CBC
HCT: 40.7 % (ref 36.0–46.0)
Hemoglobin: 13 g/dL (ref 12.0–15.0)
MCH: 29.6 pg (ref 26.0–34.0)
MCHC: 31.9 g/dL (ref 30.0–36.0)
MCV: 92.7 fL (ref 80.0–100.0)
Platelets: 339 K/uL (ref 150–400)
RBC: 4.39 MIL/uL (ref 3.87–5.11)
RDW: 12.5 % (ref 11.5–15.5)
WBC: 12.3 K/uL — ABNORMAL HIGH (ref 4.0–10.5)
nRBC: 0 % (ref 0.0–0.2)

## 2024-06-21 LAB — PHOSPHORUS: Phosphorus: 1.9 mg/dL — ABNORMAL LOW (ref 2.5–4.6)

## 2024-06-21 LAB — HIV ANTIBODY (ROUTINE TESTING W REFLEX): HIV Screen 4th Generation wRfx: NONREACTIVE

## 2024-06-21 MED ORDER — SODIUM PHOSPHATES 45 MMOLE/15ML IV SOLN
30.0000 mmol | Freq: Once | INTRAVENOUS | Status: AC
  Administered 2024-06-21 (×2): 30 mmol via INTRAVENOUS
  Filled 2024-06-21: qty 10

## 2024-06-21 MED ORDER — SODIUM CHLORIDE 0.45 % IV SOLN
INTRAVENOUS | Status: DC
  Filled 2024-06-21 (×4): qty 75

## 2024-06-21 MED ORDER — SODIUM CHLORIDE 0.9 % IV SOLN: INTRAVENOUS | Status: DC

## 2024-06-21 MED ORDER — SODIUM CHLORIDE 0.9 % IV SOLN
12.5000 mg | Freq: Four times a day (QID) | INTRAVENOUS | Status: DC | PRN
  Administered 2024-06-21 (×6): 12.5 mg via INTRAVENOUS
  Filled 2024-06-21 (×3): qty 12.5

## 2024-06-21 MED ORDER — POTASSIUM CHLORIDE 10 MEQ/100ML IV SOLN
10.0000 meq | INTRAVENOUS | Status: AC
  Administered 2024-06-21 (×8): 10 meq via INTRAVENOUS
  Filled 2024-06-21 (×4): qty 100

## 2024-06-21 NOTE — Plan of Care (Signed)
  Problem: Education: Goal: Ability to describe self-care measures that may prevent or decrease complications (Diabetes Survival Skills Education) will improve Outcome: Progressing Goal: Individualized Educational Video(s) Outcome: Progressing   Problem: Coping: Goal: Ability to adjust to condition or change in health will improve Outcome: Progressing   Problem: Fluid Volume: Goal: Ability to maintain a balanced intake and output will improve Outcome: Progressing   Problem: Metabolic: Goal: Ability to maintain appropriate glucose levels will improve Outcome: Progressing   Problem: Pain Managment: Goal: General experience of comfort will improve and/or be controlled Outcome: Progressing

## 2024-06-21 NOTE — Plan of Care (Signed)
  Problem: Education: Goal: Ability to describe self-care measures that may prevent or decrease complications (Diabetes Survival Skills Education) will improve Outcome: Progressing Goal: Individualized Educational Video(s) Outcome: Progressing   Problem: Coping: Goal: Ability to adjust to condition or change in health will improve Outcome: Progressing   Problem: Fluid Volume: Goal: Ability to maintain a balanced intake and output will improve Outcome: Progressing   Problem: Health Behavior/Discharge Planning: Goal: Ability to identify and utilize available resources and services will improve Outcome: Progressing Goal: Ability to manage health-related needs will improve Outcome: Progressing   Problem: Metabolic: Goal: Ability to maintain appropriate glucose levels will improve Outcome: Progressing   Problem: Nutritional: Goal: Maintenance of adequate nutrition will improve Outcome: Progressing Goal: Progress toward achieving an optimal weight will improve Outcome: Progressing   Problem: Skin Integrity: Goal: Risk for impaired skin integrity will decrease Outcome: Progressing   Problem: Tissue Perfusion: Goal: Adequacy of tissue perfusion will improve Outcome: Progressing   Problem: Education: Goal: Knowledge of General Education information will improve Description: Including pain rating scale, medication(s)/side effects and non-pharmacologic comfort measures Outcome: Progressing   Problem: Health Behavior/Discharge Planning: Goal: Ability to manage health-related needs will improve Outcome: Progressing   Problem: Clinical Measurements: Goal: Ability to maintain clinical measurements within normal limits will improve Outcome: Progressing Goal: Will remain free from infection Outcome: Progressing   Problem: Activity: Goal: Risk for activity intolerance will decrease Outcome: Progressing   Problem: Nutrition: Goal: Adequate nutrition will be maintained Outcome:  Progressing   Problem: Coping: Goal: Level of anxiety will decrease Outcome: Progressing   Problem: Elimination: Goal: Will not experience complications related to bowel motility Outcome: Progressing Goal: Will not experience complications related to urinary retention Outcome: Progressing   Problem: Pain Managment: Goal: General experience of comfort will improve and/or be controlled Outcome: Progressing

## 2024-06-21 NOTE — Progress Notes (Signed)
 PROGRESS NOTE  Brenda Rosales  DOB: 1985-12-25  PCP: Catherine Charlies LABOR, DO FMW:[id number]  DOA: 06/20/2024  LOS: 0 days  Hospital Day: 2  Brief narrative: Brenda Rosales is a 38 y.o. female with PMH significant for DM2, HLD, hepatic steatosis, IBS, GERD/esophagitis/dysphagia/stricture, anxiety. 8/11, patient presented to the ED with complaint of nausea, vomiting, for several hours. Patient has been on Mounjaro  for over over a year.  Dose was increased 3 months ago.  Since then, patient reports intermittent vomiting, constipation but continue to use Mounjaro . She took the last dose of Mounjaro  on 8/10, the day prior to presentation.  Since then, she started having abdominal discomfort, bloating, flank pain, vomiting and felt extremely dehydrated and hence presented to the ED.  In the ED, patient was afebrile, hemodynamically stable VBG showed pH 7.37, PCO2 34, bicarb 20 Labs with potassium 3.4, WC count 11.7 Serum pregnancy test negative Urinalysis showed clear straw-colored urine with negative leukocytes, negative nitrite, no bacteria. Chest x-ray unremarkable CT abdomen did not show any acute intra-abdominal pathology, showed small hiatal hernia.  Admitted to [facility]  Subjective: Patient was seen and examined this morning. Pleasant young Caucasian female. Lying down on bed.  Remains nauseous.  Threw up earlier this morning. Mother at bedside. Remains afebrile, hemodynamically stable Most recent labs from this morning with potassium 3.4, WBC count 12.3  Assessment and plan: Intractable nausea and vomiting Probably gastroparesis versus the effect of Mounjaro  CT scan without acute intra-abdominal pathology Currently on conservative management with IV fluid, IV antiemetics, clear liquid diet. Unable to tolerate liquid diet this morning but will try again later Monitor hydration and electrolytes  Hypokalemia/hypophosphatemia Potassium low at 3.4, phosphorus low at 1.9 Replacements  ordered. Recent Labs  Lab 06/20/24 1054 06/21/24 0403 06/21/24 1013  K 3.4* 3.4*  --   MG 2.0  --   --   PHOS  --   --  1.9*   Acute anion gap metabolic acidosis Creatinine normal but serum bicarb significantly low at 14 today.  Anion gap elevated as well VBG this morning showed normal pH at 7.38. Recent Labs    08/07/23 0834 11/17/23 0802 06/20/24 1054 06/21/24 0403  BUN 10 20 15 9   CREATININE 0.65 0.64 0.73 0.62  CO2 26 29 18* 14*    Type 2 diabetes mellitus A1c 5.9 on April 2025 PTA meds-Mounjaro , Synjardy  Currently both on hold. Continue SSI/Accu-Cheks Recent Labs  Lab 06/20/24 1052 06/20/24 1714 06/20/24 1941 06/21/24 0723 06/21/24 1208  GLUCAP 155* 93 90 93 125*   Leukocytosis minimal, likely reactive from vomiting Recent Labs  Lab 06/20/24 1054 06/21/24 0403  WBC 11.7* 12.3*    Mobility: Encourage ambulation  Goals of care   Code Status: Full Code     DVT prophylaxis:  enoxaparin  (LOVENOX ) injection 40 mg Start: 06/20/24 1700   Antimicrobials: None Fluid: Bicarb drip Consultants: None Family Communication: Mother at bedside  Status: Observation Level of care:  Med-Surg   Patient is from: Home Needs to continue in-hospital care: Needs IV hydration, clinical monitoring, Anticipated d/c to: Pending clinical course    Diet:  Diet Order             Diet clear liquid Room service appropriate? Yes; Fluid consistency: Thin  Diet effective now                   Scheduled Meds:  enoxaparin  (LOVENOX ) injection  40 mg Subcutaneous Daily   insulin  aspart  0-15 Units Subcutaneous TID WC  insulin  aspart  0-5 Units Subcutaneous QHS   pantoprazole  (PROTONIX ) IV  40 mg Intravenous QHS    PRN meds: acetaminophen  **OR** acetaminophen , albuterol , HYDROmorphone  (DILAUDID ) injection, promethazine  (PHENERGAN ) injection (IM or IVPB), traZODone    Infusions:   potassium chloride  10 mEq (06/21/24 1341)   promethazine  (PHENERGAN ) injection (IM  or IVPB) 12.5 mg (06/21/24 0948)   sodium bicarbonate  75 mEq in sodium chloride  0.45 % 1,075 mL infusion     sodium phosphate  30 mmol in sodium chloride  0.9 % 250 mL infusion 30 mmol (06/21/24 1343)    Antimicrobials: Anti-infectives (From admission, onward)    None       Objective: Vitals:   06/21/24 0311 06/21/24 1207  BP: 129/79 124/78  Pulse: 79 82  Resp: 18 16  Temp: 98.6 F (37 C) 99.2 F (37.3 C)  SpO2: 100% 100%    Intake/Output Summary (Last 24 hours) at 06/21/2024 1353 Last data filed at 06/20/2024 1734 Gross per 24 hour  Intake 2046.47 ml  Output --  Net 2046.47 ml   Filed Weights   06/20/24 1713  Weight: 57.2 kg   Weight change:  Body mass index is 21.97 kg/m.   Physical Exam: General exam: Pleasant, young Caucasian Female Skin: No rashes, lesions or ulcers. HEENT: Atraumatic, normocephalic, no obvious bleeding Lungs: Clear to auscultation bilaterally,  CVS: S1, S2, no murmur,   GI/Abd: Soft, nontender, nondistended, bowel sound present,   CNS: Alert, awake, oriented x 3 Psychiatry: Mood appropriate Extremities: No pedal edema, no calf tenderness  Data Review: I have personally reviewed the laboratory data and studies available.  F/u labs ordered Unresulted Labs (From admission, onward)     Start     Ordered   06/21/24 0500  HIV Antibody (routine testing w rflx)  (HIV Antibody (Routine testing w reflex) panel)  Tomorrow morning,   R        06/20/24 1604   Unscheduled  Basic metabolic panel with GFR  Tomorrow morning,   R        06/21/24 1353            Signed, Chapman Rota, MD [facility] 06/21/2024

## 2024-06-21 NOTE — TOC Initial Note (Signed)
 Transition of Care [facility]) - Initial/Assessment Note    Patient Details  Name: Brenda Rosales MRN: [id number] Date of Birth: [birth year]  [facility] CM/SW Contact:    Doneta Glenys DASEN, RN Phone Number: 06/21/2024, 12:23 PM  Clinical Narrative:                 Presented for vomiting and diarrhea. CM introduce and explained role. Patient staes PTA lives ina house; Verified PCP/insurance; Denies DME, HH, oxygen and SDOH needs; Patient states Kathie, Posa (Mother) [phone]  will  transport home at discharge. No IP case management needs identified during visit. Place consult if needs present.  Expected Discharge Plan: Home/Self Care Barriers to Discharge: No Barriers Identified   Patient Goals and CMS Choice Patient states their goals for this hospitalization and ongoing recovery are:: home CMS Medicare.gov Compare Post Acute Care list provided to:: Other (Comment Required) (NA) Choice offered to / list presented to : NA [place] ownership interest in [facility].provided to:: Parent NA    Expected Discharge Plan and Services In-house Referral: NA Discharge Planning Services: NA Post Acute Care Choice: NA Living arrangements for the past 2 months: Single Family Home                 DME Arranged: N/A DME Agency: NA       HH Arranged: NA HH Agency: NA        Prior Living Arrangements/Services Living arrangements for the past 2 months: Single Family Home Lives with:: Self Patient language and need for interpreter reviewed:: Yes Do you feel safe going back to the place where you live?: Yes      Need for Family Participation in Patient Care: No (Comment) Care giver support system in place?: Yes (comment) Current home services:  (NA) Criminal Activity/Legal Involvement Pertinent to Current Situation/Hospitalization: No - Comment as needed  Activities of Daily Living   ADL Screening (condition at time of admission) Independently performs  ADLs?: Yes (appropriate for developmental age) Is the patient deaf or have difficulty hearing?: No Does the patient have difficulty seeing, even when wearing glasses/contacts?: No Does the patient have difficulty concentrating, remembering, or making decisions?: No  Permission Sought/Granted Permission sought to share information with : Case Manager Permission granted to share information with : Yes, Verbal Permission Granted  Share Information with NAME: Jaclene, Bartelt (Mother)  [phone]           Emotional Assessment Appearance:: Appears stated age Attitude/Demeanor/Rapport: Engaged Affect (typically observed): Appropriate Orientation: : Oriented to Self, Oriented to Place, Oriented to  Time, Oriented to Situation Alcohol / Substance Use: Not Applicable Psych Involvement: No (comment)  Admission diagnosis:  Intractable vomiting [R11.10] Intractable nausea and vomiting [R11.2] Patient Active Problem List   Diagnosis Date Noted   Intractable vomiting 06/20/2024   Nonalcoholic fatty liver 11/17/2023   Submandibular gland swelling 11/17/2023   Eczema of face 11/12/2022   Alopecia 11/12/2022   Vitamin D  deficiency 11/12/2022   Atherosclerosis of artery-internal carotid 11/06/2022   Allergic rhinitis due to animal (cat) (dog) hair and dander 11/20/2020   Atopic dermatitis 11/20/2020   Overweight (BMI 25.0-29.9) 11/30/2017   Type 2 diabetes mellitus with other specified complication (HCC)- fatty liver 11/06/2016   Hypertriglyceridemia 04/23/2016   Asthma, persistent controlled 11/19/2007   PCP:  Catherine Charlies LABOR, DO Pharmacy:   [facility] [phone] [place] [facility] [place] [place] - [address] [place] [address] Phone: [phone] Fax: [phone]     Social  Drivers of Health (SDOH) Social History: SDOH Screenings   Food Insecurity: No Food Insecurity (06/20/2024)  Housing: Low Risk  (06/20/2024)  Transportation Needs: No Transportation Needs  (06/20/2024)  Utilities: Not At Risk (06/20/2024)  Alcohol Screen: Low Risk  (10/20/2022)  Depression (PHQ2-9): Low Risk  (11/17/2023)  Financial Resource Strain: Medium Risk (10/20/2022)  Physical Activity: Insufficiently Active (10/20/2022)  Social Connections: Moderately Isolated (10/20/2022)  Stress: No Stress Concern Present (10/20/2022)  Tobacco Use: Low Risk  (06/20/2024)   SDOH Interventions:     Readmission Risk Interventions     No data to display

## 2024-06-22 DIAGNOSIS — R1110 Vomiting, unspecified: Secondary | ICD-10-CM | POA: Diagnosis not present

## 2024-06-22 LAB — RAPID URINE DRUG SCREEN, HOSP PERFORMED
Amphetamines: NOT DETECTED
Barbiturates: NOT DETECTED
Benzodiazepines: NOT DETECTED
Cocaine: NOT DETECTED
Opiates: NOT DETECTED
Tetrahydrocannabinol: POSITIVE — AB

## 2024-06-22 LAB — BASIC METABOLIC PANEL WITH GFR
Anion gap: 13 (ref 5–15)
BUN: 9 mg/dL (ref 6–20)
CO2: 21 mmol/L — ABNORMAL LOW (ref 22–32)
Calcium: 8.5 mg/dL — ABNORMAL LOW (ref 8.9–10.3)
Chloride: 101 mmol/L (ref 98–111)
Creatinine, Ser: 0.51 mg/dL (ref 0.44–1.00)
GFR, Estimated: >60 mL/min (ref >60)
Glucose, Bld: 136 mg/dL — ABNORMAL HIGH (ref 70–99)
Potassium: 3.1 mmol/L — ABNORMAL LOW (ref 3.5–5.1)
Sodium: 135 mmol/L (ref 135–145)

## 2024-06-22 LAB — GLUCOSE, CAPILLARY
Glucose-Capillary: 116 mg/dL — ABNORMAL HIGH (ref 70–99)
Glucose-Capillary: 90 mg/dL (ref 70–99)
Glucose-Capillary: 162 mg/dL — ABNORMAL HIGH (ref 70–99)
Glucose-Capillary: 97 mg/dL (ref 70–99)

## 2024-06-22 MED ORDER — BOOST / RESOURCE BREEZE PO LIQD CUSTOM
1.0000 | Freq: Three times a day (TID) | ORAL | Status: DC
  Administered 2024-06-22 (×2): 1 via ORAL

## 2024-06-22 MED ORDER — PROCHLORPERAZINE EDISYLATE 10 MG/2ML IJ SOLN: 10.0000 mg | INTRAMUSCULAR | Status: DC | PRN

## 2024-06-22 MED ORDER — PROCHLORPERAZINE EDISYLATE 10 MG/2ML IJ SOLN
5.0000 mg | Freq: Once | INTRAMUSCULAR | Status: AC | PRN
  Administered 2024-06-22 (×2): 5 mg via INTRAVENOUS
  Filled 2024-06-22: qty 2

## 2024-06-22 MED ORDER — POTASSIUM CHLORIDE 20 MEQ PO PACK
40.0000 meq | PACK | Freq: Once | ORAL | Status: AC
  Administered 2024-06-22 (×2): 40 meq via ORAL
  Filled 2024-06-22: qty 2

## 2024-06-22 MED ORDER — ADULT MULTIVITAMIN W/MINERALS CH
1.0000 | ORAL_TABLET | Freq: Every day | ORAL | Status: DC
  Administered 2024-06-23: 1 via ORAL
  Filled 2024-06-22: qty 1

## 2024-06-22 NOTE — Plan of Care (Signed)
  Problem: Education: Goal: Ability to describe self-care measures that may prevent or decrease complications (Diabetes Survival Skills Education) will improve Outcome: Progressing Goal: Individualized Educational Video(s) Outcome: Progressing   Problem: Coping: Goal: Ability to adjust to condition or change in health will improve Outcome: Progressing   Problem: Fluid Volume: Goal: Ability to maintain a balanced intake and output will improve Outcome: Progressing   Problem: Health Behavior/Discharge Planning: Goal: Ability to identify and utilize available resources and services will improve Outcome: Progressing Goal: Ability to manage health-related needs will improve Outcome: Progressing   Problem: Metabolic: Goal: Ability to maintain appropriate glucose levels will improve Outcome: Progressing   Problem: Nutritional: Goal: Maintenance of adequate nutrition will improve Outcome: Progressing Goal: Progress toward achieving an optimal weight will improve Outcome: Progressing   Problem: Skin Integrity: Goal: Risk for impaired skin integrity will decrease Outcome: Progressing   Problem: Tissue Perfusion: Goal: Adequacy of tissue perfusion will improve Outcome: Progressing   Problem: Education: Goal: Knowledge of General Education information will improve Description: Including pain rating scale, medication(s)/side effects and non-pharmacologic comfort measures Outcome: Progressing   Problem: Health Behavior/Discharge Planning: Goal: Ability to manage health-related needs will improve Outcome: Progressing   Problem: Clinical Measurements: Goal: Ability to maintain clinical measurements within normal limits will improve Outcome: Progressing Goal: Will remain free from infection Outcome: Progressing   Problem: Activity: Goal: Risk for activity intolerance will decrease Outcome: Progressing   Problem: Nutrition: Goal: Adequate nutrition will be maintained Outcome:  Progressing   Problem: Coping: Goal: Level of anxiety will decrease Outcome: Progressing   Problem: Elimination: Goal: Will not experience complications related to bowel motility Outcome: Progressing Goal: Will not experience complications related to urinary retention Outcome: Progressing   Problem: Pain Managment: Goal: General experience of comfort will improve and/or be controlled Outcome: Progressing

## 2024-06-22 NOTE — Progress Notes (Signed)
 PROGRESS NOTE    Brenda Rosales  FMW:[id number] DOB: Mar 20, 1986 DOA: 06/20/2024 PCP: Catherine Charlies LABOR, DO   Brief Narrative:   38 y.o. female with PMH significant for DM2, HLD, hepatic steatosis, IBS, GERD/esophagitis/dysphagia/stricture, anxiety, scented to the ED with chief complaint of nausea and vomiting for the past few days.  She said that her dose of Mounjaro  was recently increased from 2.5 mg to 5 mg and after that, she started having nausea and vomiting.  She does follow-up outpatient with an endocrinologist.  She is on Mounjaro  for type 2 diabetes mellitus and has lost almost 30 pounds in the past year while on Mounjaro .  She is currently on a clear liquid diet and has been having nausea and vomiting episodes.  Electrolytes are being repleted.  She is from home.  Assessment & Plan:  Principal Problem:   Intractable vomiting    Intractable nausea and vomiting,POA:  Likely in the setting of recent increase in Mounjaro  dose (5 mg from 2.5 mg). She has been prescribed Mounjaro  by her endocrinologist for type 2 diabetes mellitus. CT scan abdomen without acute intra-abdominal pathology Currently, she is on conservative management with IV fluid, IV antiemetics Continue clear li quid diet for now Monitor hydration and electrolytes   Hypokalemia/hypophosphatemia Replete prn  Acute anion gap metabolic acidosis: Resolved.  Type 2 diabetes mellitus: A1c 5.9 on April 2025 PTA meds-Mounjaro , Synjardy  Currently, both on hold. Continue SSI/Accu-Cheks She does have an appointment with her endocrinologist at the end of this month.  Leukocytosis minimal, likely reactive from vomiting F/u CBC in am  Disposition: Home  DVT prophylaxis: enoxaparin  (LOVENOX ) injection 40 mg Start: 06/20/24 1700     Code Status: Full Code Family Communication:  None at the bedside Status is: Inpatient Remains inpatient appropriate because: refractory nausea, vomiting,  hypokalemia    Subjective:  Patient had multiple episodes of nausea and vomiting last night.  She said that Zofran  does not work well for her but Compazine  and Phenergan  worked well.  She said that she has not been able to tolerate clear liquids and would not like her diet to be advanced to a full liquid diet today.  She said that this all started when her Mounjaro  dose was doubled from 2.5 mg to 5 mg.  She does use CBD vaping approximately twice a week.  She is on Mounjaro  for type 2 diabetes mellitus.  She said that she has lost almost 30 pounds because of decreased appetite in the setting of Mounjaro  use. Denies any illicit drugs use.  She quit drinking alcohol 1 year back.  She said that she does have an appointment with her endocrinologist at the end of this month  Examination:  General exam: Appears to be in  mid distress, AAO x 3 Respiratory system: Clear to auscultation. Respiratory effort normal. Cardiovascular system: S1 & S2 heard, RRR. No JVD, murmurs, rubs, gallops or clicks. No pedal edema. Gastrointestinal system: Abdomen is nondistended, soft and nontender. No organomegaly or masses felt. Normal bowel sounds heard. Central nervous system: Alert and oriented. No focal neurological deficits. Extremities: Symmetric 5 x 5 power. Skin: No rashes, lesions or ulcers Psychiatry: Judgement and insight appear normal. Mood & affect appropriate.       Diet Orders (From admission, onward)     Start     Ordered   06/20/24 1604  Diet clear liquid Room service appropriate? Yes; Fluid consistency: Thin  Diet effective now       Question Answer Comment  Room service appropriate? Yes   Fluid consistency: Thin      06/20/24 1604            Objective: Vitals:   06/21/24 0311 06/21/24 1207 06/21/24 2037 06/22/24 0357  BP: 129/79 124/78 108/68 121/70  Pulse: 79 82 80 78  Resp: 18 16  18   Temp: 98.6 F (37 C) 99.2 F (37.3 C) 99.1 F (37.3 C) 99.1 F (37.3 C)  TempSrc: Oral  Oral  Oral  SpO2: 100% 100% 100% 99%  Weight:      Height:        Intake/Output Summary (Last 24 hours) at 06/22/2024 0930 Last data filed at 06/22/2024 0915 Gross per 24 hour  Intake 1077.29 ml  Output 0 ml  Net 1077.29 ml   Filed Weights   06/20/24 1713  Weight: 57.2 kg    Scheduled Meds:  enoxaparin  (LOVENOX ) injection  40 mg Subcutaneous Daily   insulin  aspart  0-15 Units Subcutaneous TID WC   insulin  aspart  0-5 Units Subcutaneous QHS   pantoprazole  (PROTONIX ) IV  40 mg Intravenous QHS   Continuous Infusions:  sodium bicarbonate  75 mEq in sodium chloride  0.45 % 1,075 mL infusion 100 mL/hr at 06/22/24 0443    Nutritional status     Body mass index is 21.97 kg/m.  Data Reviewed:   CBC: Recent Labs  Lab 06/20/24 1054 06/21/24 0403  WBC 11.7* 12.3*  NEUTROABS 9.0*  --   HGB 15.2* 13.0  HCT 43.8 40.7  MCV 88.8 92.7  PLT 384 339   Basic Metabolic Panel: Recent Labs  Lab 06/20/24 1054 06/21/24 0403 06/21/24 1013 06/22/24 0429  NA 136 138  --  135  K 3.4* 3.4*  --  3.1*  CL 103 108  --  101  CO2 18* 14*  --  21*  GLUCOSE 193* 114*  --  136*  BUN 15 9  --  9  CREATININE 0.73 0.62  --  0.51  CALCIUM  9.8 8.6*  --  8.5*  MG 2.0  --   --   --   PHOS  --   --  1.9*  --    GFR: Estimated Creatinine Clearance: 80.7 mL/min (by C-G formula based on SCr of 0.51 mg/dL). Liver Function Tests: Recent Labs  Lab 06/20/24 1054  AST 27  ALT 19  ALKPHOS 35*  BILITOT 1.1  PROT 8.4*  ALBUMIN 4.6   Recent Labs  Lab 06/20/24 1054  LIPASE 31   No results for input(s): AMMONIA in the last 168 hours. Coagulation Profile: No results for input(s): INR, PROTIME in the last 168 hours. Cardiac Enzymes: No results for input(s): CKTOTAL, CKMB, CKMBINDEX, TROPONINI in the last 168 hours. BNP (last 3 results) No results for input(s): PROBNP in the last 8760 hours. HbA1C: No results for input(s): HGBA1C in the last 72 hours. CBG: Recent Labs   Lab 06/21/24 0723 06/21/24 1208 06/21/24 1639 06/21/24 2039 06/22/24 0801  GLUCAP 93 125* 111* 133* 116*   Lipid Profile: No results for input(s): CHOL, HDL, LDLCALC, TRIG, CHOLHDL, LDLDIRECT in the last 72 hours. Thyroid  Function Tests: No results for input(s): TSH, T4TOTAL, FREET4, T3FREE, THYROIDAB in the last 72 hours. Anemia Panel: No results for input(s): VITAMINB12, FOLATE, FERRITIN, TIBC, IRON, RETICCTPCT in the last 72 hours. Sepsis Labs: No results for input(s): PROCALCITON, LATICACIDVEN in the last 168 hours.  No results found for this or any previous visit (from the past 240 hours).  Radiology Studies: CT ABDOMEN PELVIS W CONTRAST Result Date: 06/20/2024 CLINICAL DATA:  LLQ abdominal pain EXAM: CT ABDOMEN AND PELVIS WITH CONTRAST TECHNIQUE: Multidetector CT imaging of the abdomen and pelvis was performed using the standard protocol following bolus administration of intravenous contrast. RADIATION DOSE REDUCTION: This exam was performed according to the departmental dose-optimization program which includes automated exposure control, adjustment of the mA and/or kV according to patient size and/or use of iterative reconstruction technique. CONTRAST:  OMNIPAQUE  IOHEXOL  300 MG/ML  SOLN COMPARISON:  February 08, 2014 FINDINGS: Lower chest: No focal airspace consolidation or pleural effusion. Hepatobiliary: No mass.Diffuse hepatic steatosis.No radiopaque stones or wall thickening of the gallbladder. No intrahepatic or extrahepatic biliary ductal dilation. The portal veins are patent. Pancreas: No mass or main ductal dilation. No peripancreatic inflammation or fluid collection. Spleen: Normal size. No mass. Adrenals/Urinary Tract: No adrenal masses. No renal mass. No nephrolithiasis or hydronephrosis. The urinary bladder is distended without focal abnormality. Stomach/Bowel: Small hiatal hernia. The stomach is decompressed without focal  abnormality. No small bowel wall thickening or inflammation. No small bowel obstruction.Normal appendix. Vascular/Lymphatic: No aortic aneurysm. No intraabdominal or pelvic lymphadenopathy. Reproductive: The uterus and ovaries are within normal limits for patient's age.No free pelvic fluid. Other: No pneumoperitoneum, ascites, or mesenteric inflammation. Musculoskeletal: No acute fracture or destructive lesion. L5-S1 degenerative disc disease. IMPRESSION: 1. No acute intra-abdominal or pelvic abnormality. 2. Small hiatal hernia. Electronically Signed   By: Rogelia Myers M.D.   On: 06/20/2024 14:35   DG Chest Portable 1 View Result Date: 06/20/2024 CLINICAL DATA:  chest pain, vomiting EXAM: PORTABLE CHEST - 1 VIEW COMPARISON:  October 18, 2022 FINDINGS: No focal airspace consolidation, pleural effusion, or pneumothorax. No cardiomegaly. No acute fracture or destructive lesion. IMPRESSION: No acute cardiopulmonary abnormality. Electronically Signed   By: Rogelia Myers M.D.   On: 06/20/2024 13:27       LOS: 1 day   Time spent= 40 mins    Deliliah Room, MD [facility]  If 7PM-7AM, please contact night-coverage  06/22/2024, 9:30 AM

## 2024-06-22 NOTE — Progress Notes (Signed)
 Initial Nutrition Assessment  DOCUMENTATION CODES:   Not applicable  INTERVENTION:  - Full Liquid diet per MD.  - Boost Breeze po TID, each supplement provides 250 kcal and 9 grams of protein - Add Multivitamin with minerals daily - Monitor weight trends.   NUTRITION DIAGNOSIS:   Inadequate oral intake related to nausea, vomiting as evidenced by meal completion < 25%.  GOAL:   Patient will meet greater than or equal to 90% of their needs  MONITOR:   PO intake, Supplement acceptance, Weight trends  REASON FOR ASSESSMENT:   Malnutrition Screening Tool    ASSESSMENT:   38 y.o. female with PMH significant for DM2, HLD, hepatic steatosis, IBS, GERD/esophagitis/dysphagia/stricture, anxiety who presented with chief complaint of nausea and vomiting for the past few days since her Mounjaro  was increased.  Patient sleeping at time of visit, mom at bedside. Mom requesting to speak outside to room to allow patient to continue to get rest.   Mom reports patient's UBW to be around 140# and that she has been losing weight ever since being put on Mounjaro  1 year ago.  Per EMR, patietn weighed at 143# in January and now weighed at 126#. This is a 17# or 12.3% weight loss in 7 months, which is significant for the time frame.  Mom notes that prior to a year ago, patient had been actively trying to lose weight and was successful, but since starting the Mounjaro  she began losing weight unintentionally and couldn't stop losing.   Patient typically eats 3 meals a day at home. She has continued to try to eat 3 meals a day but they have been much smaller portions since starting Mounjaro . Mom also notes that patient has actually had nausea and vomiting every couple weeks to months since starting Mounjaro , it was just when the dose was recently increased that it significantly worsened.   Thankfully, she reports that today patient was finally able to drink some juice and water and it has stayed down.  She feels the patient would be agreeable to try Boost Breeze to support intake. Mom to also help patient try to order a CLD meal this evening.    Medications reviewed and include: -  Labs reviewed:  K+ 3.1 HA1C 5.9 (as of 4/29) Blood Glucose 93-133 x24 hours   NUTRITION - FOCUSED PHYSICAL EXAM:  Unable to obtain  Diet Order:   Diet Order             Diet full liquid Room service appropriate? Yes; Fluid consistency: Thin  Diet effective now                   EDUCATION NEEDS:  Education needs have been addressed  Skin:  Skin Assessment: Reviewed RN Assessment  Last BM:  8/12  Height:  Ht Readings from Last 1 Encounters:  06/20/24 5' 3.5 (1.613 m)   Weight:  Wt Readings from Last 1 Encounters:  06/20/24 57.2 kg   BMI:  Body mass index is 21.97 kg/m.  Estimated Nutritional Needs:  Kcal:  1700-1900 kcals Protein:  70-85 grams Fluid:  >/= 1.7L    Trude Ned RD, LDN Contact via Secure Chat.

## 2024-06-23 DIAGNOSIS — R1110 Vomiting, unspecified: Secondary | ICD-10-CM | POA: Diagnosis not present

## 2024-06-23 LAB — CBC WITH DIFFERENTIAL/PLATELET
Abs Immature Granulocytes: 0.01 K/uL (ref 0.00–0.07)
Basophils Absolute: 0.1 K/uL (ref 0.0–0.1)
Basophils Relative: 1 %
Eosinophils Absolute: 0.1 K/uL (ref 0.0–0.5)
Eosinophils Relative: 1 %
HCT: 35.9 % — ABNORMAL LOW (ref 36.0–46.0)
Hemoglobin: 12.4 g/dL (ref 12.0–15.0)
Immature Granulocytes: 0 %
Lymphocytes Relative: 46 %
Lymphs Abs: 2.6 K/uL (ref 0.7–4.0)
MCH: 30.6 pg (ref 26.0–34.0)
MCHC: 34.5 g/dL (ref 30.0–36.0)
MCV: 88.6 fL (ref 80.0–100.0)
Monocytes Absolute: 0.6 K/uL (ref 0.1–1.0)
Monocytes Relative: 11 %
Neutro Abs: 2.4 K/uL (ref 1.7–7.7)
Neutrophils Relative %: 41 %
Platelets: 311 K/uL (ref 150–400)
RBC: 4.05 MIL/uL (ref 3.87–5.11)
RDW: 12.4 % (ref 11.5–15.5)
WBC: 5.7 K/uL (ref 4.0–10.5)
nRBC: 0 % (ref 0.0–0.2)

## 2024-06-23 LAB — BASIC METABOLIC PANEL WITH GFR
Anion gap: 9 (ref 5–15)
BUN: 8 mg/dL (ref 6–20)
CO2: 21 mmol/L — ABNORMAL LOW (ref 22–32)
Calcium: 8.3 mg/dL — ABNORMAL LOW (ref 8.9–10.3)
Chloride: 103 mmol/L (ref 98–111)
Creatinine, Ser: 0.46 mg/dL (ref 0.44–1.00)
GFR, Estimated: >60 mL/min (ref >60)
Glucose, Bld: 101 mg/dL — ABNORMAL HIGH (ref 70–99)
Potassium: 3.4 mmol/L — ABNORMAL LOW (ref 3.5–5.1)
Sodium: 133 mmol/L — ABNORMAL LOW (ref 135–145)

## 2024-06-23 LAB — GLUCOSE, CAPILLARY: Glucose-Capillary: 106 mg/dL — ABNORMAL HIGH (ref 70–99)

## 2024-06-23 MED ORDER — PROCHLORPERAZINE MALEATE 5 MG PO TABS: 5.0000 mg | ORAL_TABLET | Freq: Four times a day (QID) | ORAL | 0 refills | Status: AC | PRN

## 2024-06-23 MED ORDER — ADULT MULTIVITAMIN W/MINERALS CH: 1.0000 | ORAL_TABLET | Freq: Every day | ORAL | 0 refills | Status: AC

## 2024-06-23 MED ORDER — PANTOPRAZOLE SODIUM 20 MG PO TBEC: 20.0000 mg | DELAYED_RELEASE_TABLET | Freq: Every day | ORAL | 0 refills | Status: DC

## 2024-06-23 NOTE — Discharge Summary (Signed)
 Physician Discharge Summary   Patient: Brenda Rosales MRN: [id number] DOB: Aug 27, 1986  Admit date:     06/20/2024  Discharge date: 06/23/24  Discharge Physician: Deliliah Room   PCP: Catherine Charlies LABOR, DO   Recommendations at discharge:    Follow up with your PCP in one week Decision to restart Mounjaro  will be up to your endocrinologist.  Continue taking meds as prescribed.  Discharge Diagnoses: Principal Problem:   Intractable vomiting  Hospital Course:  38 y.o. female with PMH significant for DM2, HLD, hepatic steatosis, IBS, GERD/esophagitis/dysphagia/stricture, anxiety, scented to the ED with chief complaint of nausea and vomiting for the past few days.  She said that her dose of Mounjaro  was recently increased from 2.5 mg to 5 mg and after that, she started having nausea and vomiting.  She does follow-up outpatient with an endocrinologist.  She is on Mounjaro  for type 2 diabetes mellitus and has lost almost 30 pounds in the past year while on Mounjaro .  Symptoms improved with IVF and prn antiemetics. Likely etiology was recent increase in the dose of Mounjaro  from 2.5 mg to 5 mg. Held Mounjaro  on discharge. She will follow up with her endocrinologist on her scheduled appointment. She was tolerating diet without any nausea or vomiting on the day of the discharge. Discussed with her mother at the bedside as well.       Consultants: None Procedures performed: None  Disposition: Home Diet recommendation:  Carb modified diet DISCHARGE MEDICATION: Allergies as of 06/23/2024       Reactions   Vancomycin Other (See Comments)   Red man syndrome, now more commonly referred to as Vancomycin infusion reaction (VIR)   Januvia  [sitagliptin ] Other (See Comments)   Dizziness        Medication List     STOP taking these medications    Mounjaro  2.5 MG/0.5ML Pen Generic drug: tirzepatide    tirzepatide  5 MG/0.5ML Pen Commonly known as: MOUNJARO        TAKE these  medications    albuterol  108 (90 Base) MCG/ACT inhaler Commonly known as: VENTOLIN  HFA Inhale 2 puffs into the lungs every 6 (six) hours as needed for wheezing or shortness of breath. Refill upon request only. Can be filled with any albuterol  on formulary. What changed: additional instructions   atorvastatin  10 MG tablet Commonly known as: LIPITOR Take 1 tablet (10 mg total) by mouth daily. What changed: when to take this   clobetasol 0.05 % external solution Commonly known as: TEMOVATE Apply 1 Application topically See admin instructions. Apply to affected areas of the scalp twice a day as directed   EPINEPHrine  0.3 mg/0.3 mL Soaj injection Commonly known as: EPI-PEN Inject 0.3 mg into the muscle as needed for anaphylaxis.   fenofibrate  145 MG tablet Commonly known as: TRICOR  Take 1 tablet (145 mg total) by mouth daily. What changed: when to take this   fluticasone  50 MCG/ACT nasal spray Commonly known as: FLONASE  Place 1-2 sprays into both nostrils daily.   levocetirizine 5 MG tablet Commonly known as: XYZAL  TAKE 1 TABLET BY MOUTH EVERY DAY IN THE EVENING What changed:  how much to take how to take this   montelukast 10 MG tablet Commonly known as: SINGULAIR Take 10 mg by mouth at bedtime.   multivitamin with minerals Tabs tablet Take 1 tablet by mouth daily. Start taking on: June 24, 2024   pantoprazole  20 MG tablet Commonly known as: Protonix  Take 1 tablet (20 mg total) by mouth daily.   prochlorperazine  5  MG tablet Commonly known as: COMPAZINE  Take 1 tablet (5 mg total) by mouth every 6 (six) hours as needed for nausea, vomiting or refractory nausea / vomiting.   Symbicort  160-4.5 MCG/ACT inhaler Generic drug: budesonide -formoterol  Inhale 2 puffs into the lungs 2 (two) times daily.   Synjardy  XR 12.03-999 MG Tb24 Generic drug: Empagliflozin -metFORMIN  HCl ER Take 1 tablet by mouth in the morning and at bedtime.   Vitamin D  (Ergocalciferol ) 1.25 MG  (50000 UNIT) Caps capsule Commonly known as: DRISDOL  Take 1 capsule (50,000 Units total) by mouth every 7 (seven) days. What changed: when to take this        Follow-up Information     Kuneff, Renee A, DO. Schedule an appointment as soon as possible for a visit in 1 week(s).   Specialty: Family Medicine Contact information: [address] [place] [address] [phone]                Discharge Exam: Fredricka Weights   06/20/24 1713  Weight: 57.2 kg   Constitutional: NAD, calm, comfortable Eyes: PERRL, lids and conjunctivae normal ENMT: Mucous membranes are moist. Posterior pharynx clear of any exudate or lesions.Normal dentition.  Neck: normal, supple, no masses, no thyromegaly Respiratory: clear to auscultation bilaterally, no wheezing, no crackles. Normal respiratory effort. No accessory muscle use.  Cardiovascular: Regular rate and rhythm, no murmurs / rubs / gallops. No extremity edema. 2+ pedal pulses. No carotid bruits.  Abdomen: no tenderness, no masses palpated. No hepatosplenomegaly. Bowel sounds positive.  Musculoskeletal: no clubbing / cyanosis. No joint deformity upper and lower extremities. Good ROM, no contractures. Normal muscle tone.  Skin: no rashes, lesions, ulcers. No induration Neurologic: CN 2-12 grossly intact. Sensation intact, DTR normal. Strength 5/5 x all 4 extremities.  Psychiatric: Normal judgment and insight. Alert and oriented x 3. Normal mood.    Condition at discharge: good  The results of significant diagnostics from this hospitalization (including imaging, microbiology, ancillary and laboratory) are listed below for reference.   Imaging Studies: CT ABDOMEN PELVIS W CONTRAST Result Date: 06/20/2024 CLINICAL DATA:  LLQ abdominal pain EXAM: CT ABDOMEN AND PELVIS WITH CONTRAST TECHNIQUE: Multidetector CT imaging of the abdomen and pelvis was performed using the standard protocol following bolus administration of intravenous contrast.  RADIATION DOSE REDUCTION: This exam was performed according to the departmental dose-optimization program which includes automated exposure control, adjustment of the mA and/or kV according to patient size and/or use of iterative reconstruction technique. CONTRAST:  OMNIPAQUE  IOHEXOL  300 MG/ML  SOLN COMPARISON:  February 08, 2014 FINDINGS: Lower chest: No focal airspace consolidation or pleural effusion. Hepatobiliary: No mass.Diffuse hepatic steatosis.No radiopaque stones or wall thickening of the gallbladder. No intrahepatic or extrahepatic biliary ductal dilation. The portal veins are patent. Pancreas: No mass or main ductal dilation. No peripancreatic inflammation or fluid collection. Spleen: Normal size. No mass. Adrenals/Urinary Tract: No adrenal masses. No renal mass. No nephrolithiasis or hydronephrosis. The urinary bladder is distended without focal abnormality. Stomach/Bowel: Small hiatal hernia. The stomach is decompressed without focal abnormality. No small bowel wall thickening or inflammation. No small bowel obstruction.Normal appendix. Vascular/Lymphatic: No aortic aneurysm. No intraabdominal or pelvic lymphadenopathy. Reproductive: The uterus and ovaries are within normal limits for patient's age.No free pelvic fluid. Other: No pneumoperitoneum, ascites, or mesenteric inflammation. Musculoskeletal: No acute fracture or destructive lesion. L5-S1 degenerative disc disease. IMPRESSION: 1. No acute intra-abdominal or pelvic abnormality. 2. Small hiatal hernia. Electronically Signed   By: Rogelia Myers M.D.   On: 06/20/2024  14:35   DG Chest Portable 1 View Result Date: 06/20/2024 CLINICAL DATA:  chest pain, vomiting EXAM: PORTABLE CHEST - 1 VIEW COMPARISON:  October 18, 2022 FINDINGS: No focal airspace consolidation, pleural effusion, or pneumothorax. No cardiomegaly. No acute fracture or destructive lesion. IMPRESSION: No acute cardiopulmonary abnormality. Electronically Signed   By: Rogelia Myers M.D.   On: 06/20/2024 13:27    Microbiology: Results for orders placed or performed in visit on 02/20/17  GC/Chlamydia Probe Amp     Status: None   Collection Time: 02/20/17  4:54 PM   Specimen: Blood  Result Value Ref Range Status   CT Probe RNA NOT DETECTED  Final    Comment:                    **Normal Reference Range: NOT DETECTED**   This test was performed using the APTIMA COMBO2 Assay ([employer]   The analytical performance characteristics of this assay, when used to test SurePath specimens have been determined by [facility]      GC Probe RNA NOT DETECTED  Final    Comment:                    **Normal Reference Range: NOT DETECTED**   This test was performed using the APTIMA COMBO2 Assay ([employer]   The analytical performance characteristics of this assay, when used to test SurePath specimens have been determined by [facility]       Labs: CBC: Recent Labs  Lab 06/20/24 1054 06/21/24 0403 06/23/24 0415  WBC 11.7* 12.3* 5.7  NEUTROABS 9.0*  --  2.4  HGB 15.2* 13.0 12.4  HCT 43.8 40.7 35.9*  MCV 88.8 92.7 88.6  PLT 384 339 311   Basic Metabolic Panel: Recent Labs  Lab 06/20/24 1054 06/21/24 0403 06/21/24 1013 06/22/24 0429 06/23/24 0415  NA 136 138  --  135 133*  K 3.4* 3.4*  --  3.1* 3.4*  CL 103 108  --  101 103  CO2 18* 14*  --  21* 21*  GLUCOSE 193* 114*  --  136* 101*  BUN 15 9  --  9 8  CREATININE 0.73 0.62  --  0.51 0.46  CALCIUM  9.8 8.6*  --  8.5* 8.3*  MG 2.0  --   --   --   --   PHOS  --   --  1.9*  --   --    Liver Function Tests: Recent Labs  Lab 06/20/24 1054  AST 27  ALT 19  ALKPHOS 35*  BILITOT 1.1  PROT 8.4*  ALBUMIN 4.6   CBG: Recent Labs  Lab 06/22/24 0801 06/22/24 1200 06/22/24 1650 06/22/24 2115 06/23/24 0738  GLUCAP 116* 90 162* 97 106*    Discharge time spent: 41 minutes.  Signed: Deliliah Room, MD [facility] 06/23/2024

## 2024-06-23 NOTE — Plan of Care (Signed)
  Problem: Education: Goal: Ability to describe self-care measures that may prevent or decrease complications (Diabetes Survival Skills Education) will improve Outcome: Progressing Goal: Individualized Educational Video(s) Outcome: Progressing   Problem: Coping: Goal: Ability to adjust to condition or change in health will improve Outcome: Progressing   Problem: Fluid Volume: Goal: Ability to maintain a balanced intake and output will improve Outcome: Progressing   Problem: Health Behavior/Discharge Planning: Goal: Ability to identify and utilize available resources and services will improve Outcome: Progressing Goal: Ability to manage health-related needs will improve Outcome: Progressing   Problem: Metabolic: Goal: Ability to maintain appropriate glucose levels will improve Outcome: Progressing   Problem: Nutritional: Goal: Maintenance of adequate nutrition will improve Outcome: Progressing Goal: Progress toward achieving an optimal weight will improve Outcome: Progressing   Problem: Skin Integrity: Goal: Risk for impaired skin integrity will decrease Outcome: Progressing   Problem: Tissue Perfusion: Goal: Adequacy of tissue perfusion will improve Outcome: Progressing   Problem: Education: Goal: Knowledge of General Education information will improve Description: Including pain rating scale, medication(s)/side effects and non-pharmacologic comfort measures Outcome: Progressing   Problem: Health Behavior/Discharge Planning: Goal: Ability to manage health-related needs will improve Outcome: Progressing   Problem: Clinical Measurements: Goal: Ability to maintain clinical measurements within normal limits will improve Outcome: Progressing Goal: Will remain free from infection Outcome: Progressing   Problem: Activity: Goal: Risk for activity intolerance will decrease Outcome: Progressing   Problem: Nutrition: Goal: Adequate nutrition will be maintained Outcome:  Progressing   Problem: Coping: Goal: Level of anxiety will decrease Outcome: Progressing   Problem: Elimination: Goal: Will not experience complications related to bowel motility Outcome: Progressing Goal: Will not experience complications related to urinary retention Outcome: Progressing   Problem: Pain Managment: Goal: General experience of comfort will improve and/or be controlled Outcome: Progressing

## 2024-06-23 NOTE — TOC Transition Note (Signed)
 Transition of Care [facility]) - Discharge Note   Patient Details  Name: Brenda Rosales MRN: [id number] Date of Birth: March 05, 1986  Transition of [facility] CM/SW Contact:  Alfonse JONELLE Rex, RN Phone Number: 06/23/2024, 9:40 AM   Clinical Narrative: DC Home w/family support. No TOC needs identified at this time.        Final next level of care: Home/Self Care Barriers to Discharge: No Barriers Identified   Patient Goals and CMS Choice Patient states their goals for this hospitalization and ongoing recovery are:: home CMS Medicare.gov Compare Post Acute Care list provided to:: Other (Comment Required) (NA) Choice offered to / list presented to : NA [place] ownership interest in [facility]provided to:: Parent NA    Discharge Placement                       Discharge Plan and Services Additional resources added to the After Visit Summary for   In-house Referral: NA Discharge Planning Services: NA Post Acute Care Choice: NA          DME Arranged: N/A DME Agency: NA       HH Arranged: NA HH Agency: NA        Social Drivers of Health (SDOH) Interventions SDOH Screenings   Food Insecurity: No Food Insecurity (06/20/2024)  Housing: Low Risk  (06/20/2024)  Transportation Needs: No Transportation Needs (06/20/2024)  Utilities: Not At Risk (06/20/2024)  Alcohol Screen: Low Risk  (10/20/2022)  Depression (PHQ2-9): Low Risk  (11/17/2023)  Financial Resource Strain: Medium Risk (10/20/2022)  Physical Activity: Insufficiently Active (10/20/2022)  Social Connections: Moderately Isolated (10/20/2022)  Stress: No Stress Concern Present (10/20/2022)  Tobacco Use: Low Risk  (06/20/2024)     Readmission Risk Interventions    06/23/2024    9:39 AM  Readmission Risk Prevention Plan  Post Dischage Appt Complete  Medication Screening Complete  Transportation Screening Complete

## 2024-06-24 ENCOUNTER — Telehealth: Payer: Self-pay

## 2024-06-24 NOTE — Transitions of Care (Post Inpatient/ED Visit) (Unsigned)
   06/24/2024  Name: Brenda Rosales MRN: [id number] DOB: 07-28-1986  Today's TOC FU Call Status: Today's TOC FU Call Status:: Unsuccessful Call (1st Attempt) Unsuccessful Call (1st Attempt) Date: 06/24/24  Attempted to reach the patient regarding the most recent Inpatient/ED visit.  Follow Up Plan: Additional outreach attempts will be made to reach the patient to complete the Transitions of Care (Post Inpatient/ED visit) call.   Signature Julian Lemmings, LPN [facility] Nurse Health Advisor Direct Dial [phone]

## 2024-06-27 NOTE — Transitions of Care (Post Inpatient/ED Visit) (Unsigned)
   06/27/2024  Name: Brenda Rosales MRN: [id number] DOB: 09-19-1986  Today's TOC FU Call Status: Today's TOC FU Call Status:: Unsuccessful Call (2nd Attempt) Unsuccessful Call (1st Attempt) Date: 06/24/24 Unsuccessful Call (2nd Attempt) Date: 06/27/24  Attempted to reach the patient regarding the most recent Inpatient/ED visit.  Follow Up Plan: Additional outreach attempts will be made to reach the patient to complete the Transitions of Care (Post Inpatient/ED visit) call.   Signature Julian Lemmings, LPN [facility] Nurse Health Advisor Direct Dial [phone]

## 2024-06-28 ENCOUNTER — Encounter: Payer: Self-pay | Admitting: Family Medicine

## 2024-06-28 ENCOUNTER — Ambulatory Visit: Admitting: Family Medicine

## 2024-06-28 VITALS — BP 106/70 | HR 75 | Temp 98.3°F | Wt 120.0 lb

## 2024-06-28 DIAGNOSIS — R112 Nausea with vomiting, unspecified: Secondary | ICD-10-CM | POA: Insufficient documentation

## 2024-06-28 DIAGNOSIS — E1169 Type 2 diabetes mellitus with other specified complication: Secondary | ICD-10-CM

## 2024-06-28 MED ORDER — PANTOPRAZOLE SODIUM 20 MG PO TBEC: 20.0000 mg | DELAYED_RELEASE_TABLET | Freq: Every day | ORAL | 0 refills | Status: AC

## 2024-06-28 NOTE — Progress Notes (Signed)
 Brenda Rosales , 02-Jan-1986, 38 y.o., female MRN: [id number] Patient Care Team    Relationship Specialty Notifications Start End  Catherine Charlies LABOR, DO PCP - General Family Medicine  04/23/16    Comment: Patient request  Cathlyn JAYSON Nikki Bobie FORBES, MD Consulting Physician Obstetrics and Gynecology  04/23/16   Neysa Reggy BIRCH, MD Consulting Physician Pulmonary Disease  04/23/16   Abran Norleen SAILOR, MD Consulting Physician Gastroenterology  04/23/16   Connee Nest, PA-C (Inactive)  Dermatology  05/30/20   [facility]    11/17/23   Thapa, [place], MD Consulting Physician Endocrinology  06/28/24     Chief Complaint  Patient presents with   Hospitalization Follow-up     Subjective:  Brenda Rosales  is a 38 y.o. female presents for hospital follow up after recent admission on 06/20/2024 for primary diagnosis intractable vomiting.  Patient was discharged on 06/23/2024 to home. Patients discharge summary has been reviewed, as well as all labs/image studies obtained during hospitalization.  Medication reconciliation completed today.  Patients hospital course: Patient presented to the emergency room with severe nausea and vomiting after increasing Mounjaro  dose.  She was provided with IV fluid and antiemetics, and her symptoms improved. Since hospital discharge patient reports she still has some mild stomach discomfort to palpation.  She states even when eating she can feel some discomfort in her colon.  She was having irregular bowel movements with use of Mounjaro  as well.  She states she had a normal bowel movement today.  She has been tolerating p.o.  She has an appointment with her endocrinologist tomorrow.  Recent Labs  Lab 06/23/24 0415  HGB 12.4  HCT 35.9*  WBC 5.7  PLT 311      Latest Ref Rng & Units 06/23/2024    4:15 AM 06/22/2024    4:29 AM 06/21/2024    4:03 AM  CMP  Glucose 70 - 99 mg/dL 898  863  885   BUN 6 - 20 mg/dL 8  9  9    Creatinine  0.44 - 1.00 mg/dL 9.53  9.48  9.37   Sodium 135 - 145 mmol/L 133  135  138   Potassium 3.5 - 5.1 mmol/L 3.4  3.1  3.4   Chloride 98 - 111 mmol/L 103  101  108   CO2 22 - 32 mmol/L 21  21  14    Calcium  8.9 - 10.3 mg/dL 8.3  8.5  8.6     CT ABDOMEN PELVIS W CONTRAST Result Date: 06/20/2024  IMPRESSION: 1. No acute intra-abdominal or pelvic abnormality. 2. Small hiatal hernia. Electronically Signed   By: Rogelia Myers M.D.   On: 06/20/2024 14:35   DG Chest Portable 1 View Result Date: 06/20/2024 CLINICAL DATA:  chest pain, vomiting EXAM: PORTABLE CHEST - 1 VIEW COMPARISON:  October 18, 2022 FINDINGS: No focal airspace consolidation, pleural effusion, or pneumothorax. No cardiomegaly. No acute fracture or destructive lesion. IMPRESSION: No acute cardiopulmonary abnormality. Electronically Signed   By: Rogelia Myers M.D.   On: 06/20/2024 13:27         11/17/2023    8:01 AM 11/12/2022    1:57 PM 10/22/2022    3:13 PM 10/21/2021    8:04 AM 11/20/2020    1:25 PM  Depression screen PHQ 2/9  Decreased Interest 0 0 0 0 0  Down, Depressed, Hopeless 0 0 0 0 0  PHQ - 2 Score 0 0 0 0 0  Allergies  Allergen Reactions   Vancomycin Other (See Comments)    Red man syndrome, now more commonly referred to as Vancomycin infusion reaction (VIR)   Januvia  [Sitagliptin ] Other (See Comments)    Dizziness   Social History   Tobacco Use   Smoking status: Never   Smokeless tobacco: Never  Substance Use Topics   Alcohol use: Yes    Comment: Occa   Past Medical History:  Diagnosis Date   Allergy    Anxiety    ASTHMA 11/19/2007   Asthma    CHICKENPOX 11/19/2007   Diabetes mellitus without complication (HCC)    diet controlled   Duodenitis    Dysmenorrhea 11/19/2007   DYSPHAGIA 11/19/2010   ECZEMA 11/19/2007   Esophageal stricture    Esophagitis    FATTY LIVER DISEASE 04/17/2008   GERD 11/20/2010   Hyperlipidemia    IBS (irritable bowel syndrome)    Infection of eyelid 08/07/2011    Low vitamin D  level 2018   Other and unspecified noninfectious gastroenteritis and colitis(558.9) 02/2014   RHINOSINUSITIS, CHRONIC 12/09/2007   TB SKIN TEST, POSITIVE 11/19/2007   Past Surgical History:  Procedure Laterality Date   CT SINUS LTD W/O CM  09/23/2007   Mole excision     x's 4   Family History  Adopted: Yes   Allergies as of 06/28/2024       Reactions   Vancomycin Other (See Comments)   Red man syndrome, now more commonly referred to as Vancomycin infusion reaction (VIR)   Januvia  Colbie.Colon ] Other (See Comments)   Dizziness        Medication List        Accurate as of June 28, 2024  3:09 PM. If you have any questions, ask your nurse or doctor.          albuterol  108 (90 Base) MCG/ACT inhaler Commonly known as: VENTOLIN  HFA Inhale 2 puffs into the lungs every 6 (six) hours as needed for wheezing or shortness of breath. Refill upon request only. Can be filled with any albuterol  on formulary. What changed: additional instructions   atorvastatin  10 MG tablet Commonly known as: LIPITOR Take 1 tablet (10 mg total) by mouth daily. What changed: when to take this   clobetasol 0.05 % external solution Commonly known as: TEMOVATE Apply 1 Application topically See admin instructions. Apply to affected areas of the scalp twice a day as directed   EPINEPHrine  0.3 mg/0.3 mL Soaj injection Commonly known as: EPI-PEN Inject 0.3 mg into the muscle as needed for anaphylaxis.   fenofibrate  145 MG tablet Commonly known as: TRICOR  Take 1 tablet (145 mg total) by mouth daily. What changed: when to take this   fluticasone  50 MCG/ACT nasal spray Commonly known as: FLONASE  Place 1-2 sprays into both nostrils daily.   levocetirizine 5 MG tablet Commonly known as: XYZAL  TAKE 1 TABLET BY MOUTH EVERY DAY IN THE EVENING What changed:  how much to take how to take this   montelukast 10 MG tablet Commonly known as: SINGULAIR Take 10 mg by mouth at  bedtime.   multivitamin with minerals Tabs tablet Take 1 tablet by mouth daily.   pantoprazole  20 MG tablet Commonly known as: Protonix  Take 1 tablet (20 mg total) by mouth daily.   prochlorperazine  5 MG tablet Commonly known as: COMPAZINE  Take 1 tablet (5 mg total) by mouth every 6 (six) hours as needed for nausea, vomiting or refractory nausea / vomiting.   Symbicort  160-4.5 MCG/ACT inhaler Generic drug:  budesonide -formoterol  Inhale 2 puffs into the lungs 2 (two) times daily.   Synjardy  XR 12.03-999 MG Tb24 Generic drug: Empagliflozin -metFORMIN  HCl ER Take 1 tablet by mouth in the morning and at bedtime.   Vitamin D  (Ergocalciferol ) 1.25 MG (50000 UNIT) Caps capsule Commonly known as: DRISDOL  Take 1 capsule (50,000 Units total) by mouth every 7 (seven) days. What changed: when to take this        All past medical history, surgical history, allergies, family history, immunizations and medications were updated in the EMR today and reviewed under the history and medication portions of their EMR.      ROS: Negative, with the exception of above mentioned in HPI   Objective:  BP 106/70   Pulse 75   Temp 98.3 F (36.8 C)   Wt 120 lb (54.4 kg)   SpO2 100%   BMI 20.92 kg/m  Body mass index is 20.92 kg/m. Physical Exam Vitals and nursing note reviewed.  Constitutional:      General: She is not in acute distress.    Appearance: Normal appearance. She is not ill-appearing, toxic-appearing or diaphoretic.  HENT:     Head: Normocephalic and atraumatic.  Eyes:     General: No scleral icterus.       Right eye: No discharge.        Left eye: No discharge.     Extraocular Movements: Extraocular movements intact.     Conjunctiva/sclera: Conjunctivae normal.     Pupils: Pupils are equal, round, and reactive to light.  Cardiovascular:     Rate and Rhythm: Normal rate and regular rhythm.  Pulmonary:     Effort: Pulmonary effort is normal. No respiratory distress.      Breath sounds: Normal breath sounds. No wheezing, rhonchi or rales.  Abdominal:     General: Abdomen is flat. Bowel sounds are normal. There is no distension.     Palpations: Abdomen is soft.     Tenderness: There is abdominal tenderness (Mild epigastric tenderness present). There is no guarding or rebound.  Musculoskeletal:     Right lower leg: No edema.     Left lower leg: No edema.  Skin:    General: Skin is warm.     Findings: No rash.  Neurological:     Mental Status: She is alert and oriented to person, place, and time. Mental status is at baseline.     Motor: No weakness.     Gait: Gait normal.  Psychiatric:        Mood and Affect: Mood normal.        Behavior: Behavior normal.        Thought Content: Thought content normal.        Judgment: Judgment normal.       Assessment/Plan: Brenda Rosales is a 38 y.o. female present for OV for Hospital discharge follow up Type 2 diabetes mellitus with other specified complication, without long-term current use of insulin  (HCC) (Primary) She will discuss Mounjaro  with her endocrinologist tomorrow. - Comp Met (CMET) - Lipase - Hemoglobin A1c  Nausea and vomiting, unspecified vomiting type - Comp Met (CMET) - Lipase - Hemoglobin A1c -Discussed use of MiraLAX and/or Senokot-S as needed for constipation  Reviewed expectations re: course of current medical issues. Discussed self-management of symptoms. Outlined signs and symptoms indicating need for more acute intervention. Patient verbalized understanding and all questions were answered. Patient received an After-Visit Summary. Any changes in medications were reviewed and patient was provided with updated med list  with their AVS.     Orders Placed This Encounter  Procedures   Comp Met (CMET)   Lipase   Hemoglobin A1c     Note is dictated utilizing voice recognition software. Although note has been proof read prior to signing, occasional typographical errors still can  be missed. If any questions arise, please do not hesitate to call for verification.   electronically signed by:  Charlies Bellini, DO  [facility] [place]

## 2024-06-28 NOTE — Transitions of Care (Post Inpatient/ED Visit) (Signed)
   06/28/2024  Name: Brenda Rosales MRN: [id number] DOB: 1986/09/20  Today's TOC FU Call Status: Today's TOC FU Call Status:: Unsuccessful Call (3rd Attempt) Unsuccessful Call (1st Attempt) Date: 06/24/24 Unsuccessful Call (2nd Attempt) Date: 06/27/24 Unsuccessful Call (3rd Attempt) Date: 06/28/24  Attempted to reach the patient regarding the most recent Inpatient/ED visit.  Follow Up Plan: No further outreach attempts will be made at this time. We have been unable to contact the patient.  Signature Julian Lemmings, LPN [facility] Nurse Health Advisor Direct Dial [phone]

## 2024-06-29 ENCOUNTER — Encounter: Payer: Self-pay | Admitting: Endocrinology

## 2024-06-29 ENCOUNTER — Ambulatory Visit: Admitting: Endocrinology

## 2024-06-29 ENCOUNTER — Ambulatory Visit: Payer: Self-pay | Admitting: Family Medicine

## 2024-06-29 VITALS — BP 100/60 | HR 71 | Resp 20 | Ht 63.5 in | Wt 123.4 lb

## 2024-06-29 DIAGNOSIS — Z7984 Long term (current) use of oral hypoglycemic drugs: Secondary | ICD-10-CM | POA: Diagnosis not present

## 2024-06-29 DIAGNOSIS — E119 Type 2 diabetes mellitus without complications: Secondary | ICD-10-CM

## 2024-06-29 DIAGNOSIS — Z7985 Long-term (current) use of injectable non-insulin antidiabetic drugs: Secondary | ICD-10-CM

## 2024-06-29 LAB — COMPREHENSIVE METABOLIC PANEL WITH GFR
AG Ratio: 1.8 (calc) (ref 1.0–2.5)
ALT: 14 U/L (ref 6–29)
AST: 18 U/L (ref 10–30)
Albumin: 4.6 g/dL (ref 3.6–5.1)
Alkaline phosphatase (APISO): 38 U/L (ref 31–125)
BUN: 14 mg/dL (ref 7–25)
CO2: 22 mmol/L (ref 20–32)
Calcium: 9.1 mg/dL (ref 8.6–10.2)
Chloride: 104 mmol/L (ref 98–110)
Creat: 0.59 mg/dL (ref 0.50–0.97)
Globulin: 2.6 g/dL (ref 1.9–3.7)
Glucose, Bld: 87 mg/dL (ref 65–99)
Potassium: 4 mmol/L (ref 3.5–5.3)
Sodium: 136 mmol/L (ref 135–146)
Total Bilirubin: 0.6 mg/dL (ref 0.2–1.2)
Total Protein: 7.2 g/dL (ref 6.1–8.1)
eGFR: 118 mL/min/1.73m2 (ref >=60)

## 2024-06-29 LAB — HEMOGLOBIN A1C
Hgb A1c MFr Bld: 5.7 % — ABNORMAL HIGH (ref <5.7)
Mean Plasma Glucose: 117 mg/dL
eAG (mmol/L): 6.5 mmol/L

## 2024-06-29 LAB — LIPASE: Lipase: 24 U/L (ref 7–60)

## 2024-06-29 MED ORDER — SYNJARDY XR 12.5-1000 MG PO TB24: 1.0000 | ORAL_TABLET | Freq: Two times a day (BID) | ORAL | 3 refills | Status: AC

## 2024-06-29 NOTE — Progress Notes (Signed)
 Outpatient Endocrinology Note Brenda Sewell Pitner, MD  06/29/24  Patient's Name: Brenda Rosales    DOB: [birth year]    MRN: [id number]                                                    REASON OF VISIT: Follow up for type 2 diabetes mellitus  PCP: Brenda Fuller A, DO  HISTORY OF PRESENT ILLNESS:   Brenda Rosales is Rosales 38 y.o. old female with past medical history listed below, is here for follow up for type 2 diabetes mellitus.   Pertinent Diabetes History: Patient was diagnosed with type 2 diabetes mellitus in 2012.  Chronic Diabetes Complications : Retinopathy: no. Last ophthalmology exam was done on annually.  Nephropathy: no Peripheral neuropathy: no Coronary artery disease: no Stroke: no  Relevant comorbidities and cardiovascular risk factors: Obesity: no Body mass index is 21.52 kg/m.  Hypertension: no Hyperlipidemia. yes  Current / Home Diabetic regimen includes: Synjardy  XR 12.03/999, 1 tablet 2 times Rosales day.  Prior diabetic medications: She had tried to Mounjaro  for about Rosales month around December 2023, stopped due to backorder issues. Mounjaro  is stopped due to GI intolerance nausea/vomiting on 5 mg dose in August 2025.  Mounjaro  was restarted in January 2025, did not have GI issues with 2.5 mg dose.   Glycemic data:   She has not been checking blood sugar at home.  She has freestyle libre 3 CGM has not started it.  Hypoglycemia: Patient has no hypoglycemic episodes. Patient has hypoglycemia awareness.  Factors modifying glucose control: 1.  Diabetic diet assessment: Small meals 2-3 meals Rosales day.  2.  Staying active or exercising: Gym and walking.  3.  Medication compliance: compliant all of the time.  Interval history  Patient had intractable vomiting on Mounjaro  5 mg dose, requiring ER visit and hospitalization last week, Mounjaro  was stopped.  She has been taking Synjardy .  Hemoglobin A1c 5.7%.  After being on Mounjaro  she overall lost about 20 to 30 pounds of  weight.  She is no longer having nausea and vomiting.  She has not been checking blood sugar at home, no glucose data to review.  No other complaints today.  REVIEW OF SYSTEMS As per history of present illness.   PAST MEDICAL HISTORY: Past Medical History:  Diagnosis Date   Allergy    Anxiety    ASTHMA 11/19/2007   Asthma    CHICKENPOX 11/19/2007   Diabetes mellitus without complication (HCC)    diet controlled   Duodenitis    Dysmenorrhea 11/19/2007   DYSPHAGIA 11/19/2010   ECZEMA 11/19/2007   Esophageal stricture    Esophagitis    FATTY LIVER DISEASE 04/17/2008   GERD 11/20/2010   Hyperlipidemia    IBS (irritable bowel syndrome)    Infection of eyelid 08/07/2011   Low vitamin D  level 2018   Other and unspecified noninfectious gastroenteritis and colitis(558.9) 02/2014   RHINOSINUSITIS, CHRONIC 12/09/2007   TB SKIN TEST, POSITIVE 11/19/2007    PAST SURGICAL HISTORY: Past Surgical History:  Procedure Laterality Date   CT SINUS LTD W/O CM  09/23/2007   Mole excision     x's 4    ALLERGIES: Allergies  Allergen Reactions   Vancomycin Other (See Comments)    Red man syndrome, now more commonly referred to as Vancomycin infusion reaction (VIR)  Januvia  [Sitagliptin ] Other (See Comments)    Dizziness    FAMILY HISTORY:  Family History  Adopted: Yes    SOCIAL HISTORY: Social History   Socioeconomic History   Marital status: Single    Spouse name: Not on file   Number of children: 0   Years of education: Not on file   Highest education level: Bachelor's degree (e.g., BA, AB, BS)  Occupational History   Occupation: Magazine features editor: [employer]  Tobacco Use   Smoking status: Never   Smokeless tobacco: Never  Vaping Use   Vaping status: Never Used  Substance and Sexual Activity   Alcohol use: Yes    Comment: Occa   Drug use: No   Sexual activity: Yes    Partners: Male  Other Topics Concern   Not on file  Social History Narrative    Adopted from [place].    College Occupational psychologist in [employer]   Single. No children.   Drinks caffeine, uses herbal remedies   Wears her seatbelt, wears bicycle helmet   Reports routine exercise    Smoke detector in the home.   Feels safe in relationships.     Social Drivers of Health   Financial Resource Strain: Medium Risk (10/20/2022)   Overall Financial Resource Strain (CARDIA)    Difficulty of Paying Living Expenses: Somewhat hard  Food Insecurity: No Food Insecurity (06/20/2024)   Hunger Vital Sign    Worried About Running Out of Food in the Last Year: Never true    Ran Out of Food in the Last Year: Never true  Transportation Needs: No Transportation Needs (06/20/2024)   PRAPARE - Administrator, Civil Service (Medical): No    Lack of Transportation (Non-Medical): No  Physical Activity: Insufficiently Active (10/20/2022)   Exercise Vital Sign    Days of Exercise per Week: 3 days    Minutes of Exercise per Session: 30 min  Stress: No Stress Concern Present (10/20/2022)   [employer] of Occupational Health - Occupational Stress Questionnaire    Feeling of Stress : Not at all  Social Connections: Moderately Isolated (10/20/2022)   Social Connection and Isolation Panel    Frequency of Communication with Friends and Family: More than three times Rosales week    Frequency of Social Gatherings with Friends and Family: More than three times Rosales week    Attends Religious Services: Never    Database administrator or Organizations: Yes    Attends Engineer, structural: More than 4 times per year    Marital Status: Never married    MEDICATIONS:  Current Outpatient Medications  Medication Sig Dispense Refill   albuterol  (VENTOLIN  HFA) 108 (90 Base) MCG/ACT inhaler Inhale 2 puffs into the lungs every 6 (six) hours as needed for wheezing or shortness of breath. Refill upon request only. Can be filled with any albuterol  on formulary. 8 g 11    atorvastatin  (LIPITOR) 10 MG tablet Take 1 tablet (10 mg total) by mouth daily. 90 tablet 3   clobetasol (TEMOVATE) 0.05 % external solution Apply 1 Application topically See admin instructions. Apply to affected areas of the scalp twice Rosales day as directed     EPINEPHrine  0.3 mg/0.3 mL IJ SOAJ injection Inject 0.3 mg into the muscle as needed for anaphylaxis.     fenofibrate  (TRICOR ) 145 MG tablet Take 1 tablet (145 mg total) by mouth daily. 90 tablet 3   fluticasone  (FLONASE ) 50 MCG/ACT  nasal spray Place 1-2 sprays into both nostrils daily.     levocetirizine (XYZAL ) 5 MG tablet TAKE 1 TABLET BY MOUTH EVERY DAY IN THE EVENING 90 tablet 0   montelukast (SINGULAIR) 10 MG tablet Take 10 mg by mouth at bedtime.     Multiple Vitamin (MULTIVITAMIN WITH MINERALS) TABS tablet Take 1 tablet by mouth daily. 20 tablet 0   pantoprazole  (PROTONIX ) 20 MG tablet Take 1 tablet (20 mg total) by mouth daily. 30 tablet 0   prochlorperazine  (COMPAZINE ) 5 MG tablet Take 1 tablet (5 mg total) by mouth every 6 (six) hours as needed for nausea, vomiting or refractory nausea / vomiting. 15 tablet 0   SYMBICORT  160-4.5 MCG/ACT inhaler Inhale 2 puffs into the lungs 2 (two) times daily.  3   Vitamin D , Ergocalciferol , (DRISDOL ) 1.25 MG (50000 UNIT) CAPS capsule Take 1 capsule (50,000 Units total) by mouth every 7 (seven) days. 12 capsule 3   Empagliflozin -metFORMIN  HCl ER (SYNJARDY  XR) 12.03-999 MG TB24 Take 1 tablet by mouth in the morning and at bedtime. 180 tablet 3   No current facility-administered medications for this visit.    PHYSICAL EXAM: Vitals:   06/29/24 0808  BP: 100/60  Pulse: 71  Resp: 20  SpO2: 99%  Weight: 123 lb 6.4 oz (56 kg)  Height: 5' 3.5 (1.613 m)    Body mass index is 21.52 kg/m.  Wt Readings from Last 3 Encounters:  06/29/24 123 lb 6.4 oz (56 kg)  06/28/24 120 lb (54.4 kg)  06/20/24 126 lb (57.2 kg)    General: Well developed, well nourished female in no apparent distress.  HEENT:  AT/[place], no external lesions.  Eyes: Conjunctiva clear and no icterus. Neck: Neck supple  Lungs: Respirations not labored Neurologic: Alert, oriented, normal speech Extremities / Skin: Dry.   Psychiatric: Does not appear depressed or anxious  Diabetic Foot Exam - Simple   No data filed    LABS Reviewed Lab Results  Component Value Date   HGBA1C 5.7 (H) 06/28/2024   HGBA1C 5.9 (Rosales) 03/08/2024   HGBA1C 7.4 (H) 11/17/2023   No results found for: FRUCTOSAMINE Lab Results  Component Value Date   CHOL 201 (H) 11/17/2023   HDL 51.40 11/17/2023   LDLCALC 83 11/17/2023   LDLDIRECT 142.0 04/27/2019   TRIG 335.0 (H) 11/17/2023   CHOLHDL 4 11/17/2023   Lab Results  Component Value Date   MICRALBCREAT 6 12/10/2020   MICRALBCREAT 9 06/09/2018   Lab Results  Component Value Date   CREATININE 0.59 06/28/2024   Lab Results  Component Value Date   GFR 112.52 11/17/2023    ASSESSMENT / PLAN  1. Controlled type 2 diabetes mellitus without complication, without long-term current use of insulin  (HCC)   2. Type 2 diabetes mellitus without complication, without long-term current use of insulin  (HCC)     Diabetes Mellitus type 2, complicated by no known complications. - Diabetic status / severity: Controlled.  Lab Results  Component Value Date   HGBA1C 5.7 (H) 06/28/2024    - Hemoglobin A1c goal : <6.5%  Patient has controlled diabetes mellitus.  She lost significant amount of weight while being on Mounjaro , was tolerating Mounjaro  2.5 mg weekly.  However she had GI intolerance with vomiting requiring hospitalization on 5 mg dose.  Mounjaro  was stopped from patient hospitalization last week.  - Medications:.  I) continue Synjardy  12.03/999 mg 1 tablet 2 times Rosales day. II) no plan to resume Mounjaro  for now.  If needed in  the future, Mounjaro  2.5 mg weekly can be considered.  - Home glucose testing: Use freestyle libre 3 CGM or check blood sugar in the morning fasting and at  bedtime at least few times Rosales week.  Currently she has not been checking blood sugar at home.  Encouraged to monitor blood sugar at home. - Discussed/ Gave Hypoglycemia treatment plan.  # Consult : not required at this time.   # Annual urine for microalbuminuria/ creatinine ratio, no microalbuminuria currently.  Will check today.  Last  Lab Results  Component Value Date   MICRALBCREAT 6 12/10/2020    # Foot check nightly.  # Annual dilated diabetic eye exams.   - Diet: Make healthy diabetic food choices - Life style / activity / exercise: Discussed.  2. Blood pressure  -  BP Readings from Last 1 Encounters:  06/29/24 100/60    - Control is in target.  - No change in current plans.  3. Lipid status / Hyperlipidemia - Last  Lab Results  Component Value Date   LDLCALC 83 11/17/2023   - Continue atorvastatin  10 mg daily and fenofibrate  145 mg daily.  Diagnoses and all orders for this visit:  Controlled type 2 diabetes mellitus without complication, without long-term current use of insulin  (HCC) -     Microalbumin / creatinine urine ratio  Type 2 diabetes mellitus without complication, without long-term current use of insulin  (HCC) -     Empagliflozin -metFORMIN  HCl ER (SYNJARDY  XR) 12.03-999 MG TB24; Take 1 tablet by mouth in the morning and at bedtime.     DISPOSITION Follow up in clinic in 3 months suggested.   All questions answered and patient verbalized understanding of the plan.  Brenda Markee Remlinger, MD [facility] Endocrinology [facility] [address] [place], [address] [place], [place] [address] Phone # [phone]  At least part of this note was generated using voice recognition software. Inadvertent word errors may have occurred, which were not recognized during the proofreading process.

## 2024-06-30 ENCOUNTER — Ambulatory Visit: Payer: Self-pay | Admitting: Endocrinology

## 2024-06-30 LAB — MICROALBUMIN / CREATININE URINE RATIO
Creatinine, Urine: 69 mg/dL (ref 20–275)
Microalb Creat Ratio: 3 mg/g{creat} (ref <30)
Microalb, Ur: 0.2 mg/dL

## 2024-07-14 ENCOUNTER — Encounter: Payer: Self-pay | Admitting: Endocrinology

## 2024-07-14 DIAGNOSIS — E119 Type 2 diabetes mellitus without complications: Secondary | ICD-10-CM

## 2024-07-14 DIAGNOSIS — E1165 Type 2 diabetes mellitus with hyperglycemia: Secondary | ICD-10-CM

## 2024-07-15 ENCOUNTER — Other Ambulatory Visit: Payer: Self-pay

## 2024-07-26 MED ORDER — CONTOUR NEXT TEST VI STRP: ORAL_STRIP | 12 refills | Status: AC

## 2024-07-26 MED ORDER — CONTOUR NEXT MONITOR W/DEVICE KIT: PACK | 0 refills | Status: AC

## 2024-08-11 NOTE — Telephone Encounter (Signed)
 As long as fasting glucose in low 100s -130s range should be okay, no need to add medication for now.

## 2024-09-29 ENCOUNTER — Ambulatory Visit: Admitting: Endocrinology

## 2024-10-05 ENCOUNTER — Other Ambulatory Visit: Payer: Self-pay | Admitting: Family Medicine

## 2024-11-04 ENCOUNTER — Telehealth (INDEPENDENT_AMBULATORY_CARE_PROVIDER_SITE_OTHER): Admitting: Family Medicine

## 2024-11-04 ENCOUNTER — Ambulatory Visit: Admitting: Family Medicine

## 2024-11-04 ENCOUNTER — Ambulatory Visit: Payer: Self-pay

## 2024-11-04 DIAGNOSIS — F410 Panic disorder [episodic paroxysmal anxiety] without agoraphobia: Secondary | ICD-10-CM | POA: Diagnosis not present

## 2024-11-04 DIAGNOSIS — F430 Acute stress reaction: Secondary | ICD-10-CM | POA: Diagnosis not present

## 2024-11-04 DIAGNOSIS — F4321 Adjustment disorder with depressed mood: Secondary | ICD-10-CM | POA: Diagnosis not present

## 2024-11-04 MED ORDER — ALPRAZOLAM 0.5 MG PO TABS: ORAL_TABLET | ORAL | 0 refills | Status: DC

## 2024-11-04 NOTE — Progress Notes (Signed)
 Virtual Visit via Video Note  I connected with Brenda Rosales  on 11/04/2024 at 11:00 AM EST by a video enabled telemedicine application and verified that I am speaking with the correct person using two identifiers.  Location patient: [place] Location provider:work or home office Persons participating in the virtual visit: patient, provider  I discussed the limitations and requested verbal permission for telemedicine visit. The patient expressed understanding and agreed to proceed.   HPI: 38 year old female being seen today for anxiety. Her mother died yesterday morning.  Her mother had had cancer and in these last days her condition has been bad and has caused Brenda Rosales some panic attacks.  Since her death yesterday she has had multiple periods of being overwhelmed with grief and panic--> dizzy, palpitations, feeling of doom, breathlessness. Prior to this she denied feeling depressed.  She denies any suicidal or homicidal ideation.  She does not drink.  No history of drug abuse.  Yesterday she took one of her mother's old alprazolam  0.5 mg tabs, one half tab and it helped calm her and she slept for a few hours.   ROS: See pertinent positives and negatives per HPI.  Past Medical History:  Diagnosis Date   Allergy    Anxiety    ASTHMA 11/19/2007   Asthma    CHICKENPOX 11/19/2007   Diabetes mellitus without complication (HCC)    diet controlled   Duodenitis    Dysmenorrhea 11/19/2007   DYSPHAGIA 11/19/2010   ECZEMA 11/19/2007   Esophageal stricture    Esophagitis    FATTY LIVER DISEASE 04/17/2008   GERD 11/20/2010   Hyperlipidemia    IBS (irritable bowel syndrome)    Infection of eyelid 08/07/2011   Low vitamin D  level 2018   Other and unspecified noninfectious gastroenteritis and colitis(558.9) 02/2014   RHINOSINUSITIS, CHRONIC 12/09/2007   TB SKIN TEST, POSITIVE 11/19/2007    Past Surgical History:  Procedure Laterality Date   CT SINUS LTD W/O CM  09/23/2007   Mole excision     x's 4     Current Medications[1]  EXAM:  VITALS per patient if applicable:     06/29/2024    8:08 AM 06/28/2024    1:35 PM 06/23/2024    4:52 AM  Vitals with BMI  Height 5' 3.5    Weight 123 lbs 6 oz 120 lbs   BMI 21.51 20.92   Systolic 100 106 885  Diastolic 60 70 80  Pulse 71 75 66     GENERAL: alert, oriented, appears well and in no acute distress  HEENT: atraumatic, conjunttiva clear, no obvious abnormalities on inspection of external nose and ears  NECK: normal movements of the head and neck  LUNGS: on inspection no signs of respiratory distress, breathing rate appears normal, no obvious gross SOB, gasping or wheezing  CV: no obvious cyanosis  MS: moves all visible extremities without noticeable abnormality  PSYCH/NEURO: pleasant and cooperative, no obvious depression or anxiety, speech and thought processing grossly intact  LABS: none today    Chemistry      Component Value Date/Time   NA 136 06/28/2024 0000   K 4.0 06/28/2024 0000   CL 104 06/28/2024 0000   CO2 22 06/28/2024 0000   BUN 14 06/28/2024 0000   CREATININE 0.59 06/28/2024 0000      Component Value Date/Time   CALCIUM  9.1 06/28/2024 0000   ALKPHOS 35 (L) 06/20/2024 1054   AST 18 06/28/2024 0000   ALT 14 06/28/2024 0000   BILITOT 0.6 06/28/2024  0000     Lab Results  Component Value Date   HGBA1C 5.7 (H) 06/28/2024   ASSESSMENT AND PLAN:  Discussed the following assessment and plan:  Acute grief with panic attacks. Alprazolam  0.5 mg tabs, 1/2-1 tab twice daily as needed, #10, no refill. We discussed in detail the potential risks and benefits of this medication, including potential for addiction and dependence as well as oversedation and respiratory depression.  She expressed understanding.  She also expressed understanding that this would be a short-term treatment.   PMP AWARE reviewed today: There were no entries. No red flags.  I discussed the assessment and treatment plan with the  patient. The patient was provided an opportunity to ask questions and all were answered. The patient agreed with the plan and demonstrated an understanding of the instructions.   F/U: 5-7d w/PCP if not improving  Signed:  Gerlene Hockey, MD           11/04/2024     [1]  Current Outpatient Medications:    albuterol  (VENTOLIN  HFA) 108 (90 Base) MCG/ACT inhaler, Inhale 2 puffs into the lungs every 6 (six) hours as needed for wheezing or shortness of breath. Refill upon request only. Can be filled with any albuterol  on formulary., Disp: 8 g, Rfl: 11   atorvastatin  (LIPITOR) 10 MG tablet, TAKE 1 TABLET BY MOUTH EVERY DAY, Disp: 45 tablet, Rfl: 0   Blood Glucose Monitoring Suppl (CONTOUR NEXT MONITOR) w/Device KIT, Use to  check blood sugar in the morning fasting and at bedtime at least 3 times a week., Disp: 1 kit, Rfl: 0   clobetasol (TEMOVATE) 0.05 % external solution, Apply 1 Application topically See admin instructions. Apply to affected areas of the scalp twice a day as directed, Disp: , Rfl:    Empagliflozin -metFORMIN  HCl ER (SYNJARDY  XR) 12.03-999 MG TB24, Take 1 tablet by mouth in the morning and at bedtime., Disp: 180 tablet, Rfl: 3   EPINEPHrine  0.3 mg/0.3 mL IJ SOAJ injection, Inject 0.3 mg into the muscle as needed for anaphylaxis., Disp: , Rfl:    fenofibrate  (TRICOR ) 145 MG tablet, Take 1 tablet (145 mg total) by mouth daily., Disp: 90 tablet, Rfl: 3   fluticasone  (FLONASE ) 50 MCG/ACT nasal spray, Place 1-2 sprays into both nostrils daily., Disp: , Rfl:    glucose blood (CONTOUR NEXT TEST) test strip, check blood sugar in the morning fasting and at bedtime at least 3 times a week., Disp: 300 each, Rfl: 12   levocetirizine (XYZAL ) 5 MG tablet, TAKE 1 TABLET BY MOUTH EVERY DAY IN THE EVENING, Disp: 90 tablet, Rfl: 0   montelukast (SINGULAIR) 10 MG tablet, Take 10 mg by mouth at bedtime., Disp: , Rfl:    Multiple Vitamin (MULTIVITAMIN WITH MINERALS) TABS tablet, Take 1 tablet by mouth  daily., Disp: 20 tablet, Rfl: 0   pantoprazole  (PROTONIX ) 20 MG tablet, Take 1 tablet (20 mg total) by mouth daily., Disp: 30 tablet, Rfl: 0   prochlorperazine  (COMPAZINE ) 5 MG tablet, Take 1 tablet (5 mg total) by mouth every 6 (six) hours as needed for nausea, vomiting or refractory nausea / vomiting., Disp: 15 tablet, Rfl: 0   SYMBICORT  160-4.5 MCG/ACT inhaler, Inhale 2 puffs into the lungs 2 (two) times daily., Disp: , Rfl: 3   Vitamin D , Ergocalciferol , (DRISDOL ) 1.25 MG (50000 UNIT) CAPS capsule, Take 1 capsule (50,000 Units total) by mouth every 7 (seven) days., Disp: 12 capsule, Rfl: 3

## 2024-11-04 NOTE — Telephone Encounter (Signed)
 Pt scheduled for same day appt with alternate provider.

## 2024-11-04 NOTE — Telephone Encounter (Signed)
 FYI Only or Action Required?: FYI only for provider: appointment scheduled on 12/26.  Patient was last seen in primary care on 06/28/2024 by Catherine Fuller A, DO.  Called Nurse Triage reporting Anxiety.  Symptoms began x 2 days ago.  Interventions attempted: Nothing.  Symptoms are: gradually worsening.  Triage Disposition: See Physician Within 24 Hours  Patient/caregiver understands and will follow disposition?: Yes               Copied from CRM [id number]. Topic: Clinical - Red Word Triage >> Nov 04, 2024  8:06 AM Thersia BROCKS wrote: [facility] that prompted transfer to Nurse Triage: Patient mom passed away christmas eve, stated she hasnt slept and experencing a lot of anxiety, wanted to know if she could get anything prescribed Reason for Disposition  Patient sounds very upset or troubled to the triager  Answer Assessment - Initial Assessment Questions 1. CONCERN: Did anything happen that prompted you to call today?      Mom passed away on Christmas Eve   2. ANXIETY SYMPTOMS: Can you describe how you (your loved one; patient) have been feeling? (e.g., tense, restless, panicky, anxious, keyed up, overwhelmed, sense of impending doom).      Restless  3. ONSET: How long have you been feeling this way? (e.g., hours, days, weeks)     X 2 days   4. SEVERITY: How would you rate the level of anxiety? (e.g., 0 - 10; or mild, moderate, severe).     Severe   5. FUNCTIONAL IMPAIRMENT: How have these feelings affected your ability to do daily activities? Have you had more difficulty than usual doing your normal daily activities? (e.g., getting better, same, worse; self-care, school, work, interactions)     Worse, sadness   6. HISTORY: Have you felt this way before? Have you ever been diagnosed with an anxiety problem in the past? (e.g., generalized anxiety disorder, panic attacks, PTSD). If Yes, ask: How was this problem treated? (e.g., medicines, counseling, etc.)       Anxiety several years ago   7. RISK OF HARM - SUICIDAL IDEATION: Do you ever have thoughts of hurting or killing yourself? If Yes, ask:  Do you have these feelings now? Do you have a plan on how you would do this?     No   8. TREATMENT:  What has been done so far to treat this anxiety? (e.g., medicines, relaxation strategies). What has helped?     Nothing   9. THERAPIST: Do you have a counselor or therapist? If Yes, ask: What is their name?     Seeing a therapist currently   10. POTENTIAL TRIGGERS: Do you drink caffeinated beverages (e.g., coffee, colas, teas), and how much daily? Do you drink alcohol or use any drugs? Have you started any new medicines recently?       N/A   11. PATIENT SUPPORT: Who is with you now? Who do you live with? Do you have family or friends who you can talk to?        Family and friends   12. OTHER SYMPTOMS: Do you have any other symptoms? (e.g., feeling depressed, trouble concentrating, trouble sleeping, trouble breathing, palpitations or fast heartbeat, chest pain, sweating, nausea, or diarrhea)       Trouble sleeping   13. PREGNANCY: Is there any chance you are pregnant? When was your last menstrual period?       No     Patient called in to triage with complaints of anxiety  returning related to her mother passing a couple of days ago.  This has been ongoing for 2 days  The patient stated she was diagnosed with anxiety in the early 2000's, she is currently not on any medications but is seeing a therapist.  Appointment scheduled for further evaluation; and agrees with the plan of care, and will reach out if symptoms worsen or persist.  Protocols used: Anxiety and Panic Attack-A-AH

## 2024-11-16 ENCOUNTER — Other Ambulatory Visit: Payer: Self-pay | Admitting: Family Medicine

## 2024-11-17 ENCOUNTER — Ambulatory Visit: Payer: Self-pay | Admitting: Family Medicine

## 2024-11-17 NOTE — Telephone Encounter (Signed)
 Pt has appt tomorrow, 1/9.

## 2024-11-17 NOTE — Telephone Encounter (Signed)
 FYI Only or Action Required?: Action required by provider: medication refill request.  Patient was last seen in primary care on 11/04/2024 by McGowen, Aleene DEL, MD.  Called Nurse Triage reporting Anxiety.  Symptoms began several weeks ago.  Interventions attempted: Prescription medications: alprazolam .  Symptoms are: gradually worsening.  Triage Disposition: See PCP When Office is Open (Within 3 Days)  Patient/caregiver understands and will follow disposition?:   Copied from CRM #[id number]. Topic: Clinical - Red Word Triage >> Nov 17, 2024  4:18 PM Brenda Rosales wrote: Red Word that prompted transfer to Nurse Triage: Patient states she had a death in the family and is having very high anxiety and depression , and no meds Reason for Disposition  Recent traumatic event (e.g., death of a loved one, job loss, victim/witness of crime)  Answer Assessment - Initial Assessment Questions 1. CONCERN: Did anything happen that prompted you to call today?      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 Patient says she is shaking just knowing she isn't getting help tomorrow from doctor due to canceled appointment. Heaviness in chest sometimes until she can calm down. Patient willing to go to another office or see different provider but she tried that before and they called her and said they won't get that medication. She is asking for a refill until she is seen on 11/22/2024 or guidance on what to do. Discussed holistic ideas in the meantime. Offered appointment with other provider sooner but since they couldn't give her med last time, did not. Soonest available appointment made with provider. [facility] Patient is looking for anybody to be able to write her out for short term disability. 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 2. ANXIETY SYMPTOMS: Can you describe how you (your loved one; patient) have been feeling? (e.g., tense, restless, panicky, anxious, keyed up, overwhelmed, sense of impending doom).      Alprazolam . 0.5mg  3. ONSET: How long have you been feeling this way? (e.g., hours, days, weeks)     Since mother died around Christmas 4. SEVERITY: How would you rate the level of anxiety? (e.g., 0 - 10; or mild, moderate, severe).     Severe 5. FUNCTIONAL IMPAIRMENT: How have these feelings affected your ability to do daily activities? Have you had more difficulty than usual doing your normal daily activities? (e.g., getting better, same, worse; self-care, school, work, curator)     Sleeping, having issues doing things with other people but if she's alone she has a hard time. 6. HISTORY: Have you felt this way before? Have you ever been diagnosed with an anxiety problem in the past? (e.g., generalized anxiety disorder, panic attacks, PTSD). If Yes, ask: How was this problem treated? (e.g., medicines, counseling, etc.)     If she gets stressed out or not sleeping she can get panic attacks. 8. TREATMENT:  What has been done so far to treat this anxiety? (e.g., medicines, relaxation strategies). What has helped?     Alprazolam  9. THERAPIST: Do you have a counselor or therapist? If Yes, ask: What is their name?     Yes, has a licensed therapist 11. PATIENT SUPPORT: Who is with you now? Who do you live with? Do you have family or friends who you can  talk to?        Fiance 12. OTHER SYMPTOMS: Do you have any other symptoms? (e.g., feeling depressed, trouble concentrating, trouble sleeping, trouble breathing, palpitations or fast heartbeat, chest pain, sweating, nausea, or diarrhea)       Sweating if she gets very worked up. If she gets really worked up she can get dizzy and then get really tired  after.  Protocols used: Anxiety and Panic Attack-A-AH

## 2024-11-18 ENCOUNTER — Encounter: Payer: 59 | Admitting: Family Medicine

## 2024-11-18 ENCOUNTER — Telehealth: Payer: Self-pay

## 2024-11-18 NOTE — Telephone Encounter (Signed)
Pt has appt on 11/4

## 2024-11-18 NOTE — Telephone Encounter (Signed)
 Pt scheduled for ov with pcp

## 2024-11-18 NOTE — Telephone Encounter (Signed)
 Pt is aware pcp is not in the office today and that medication may not be refilled today.  Copied from CRM [id number]. Topic: Clinical - Medication Question >> Nov 18, 2024  9:15 AM [name] wrote: Reason for CRM: Patient appointment was cancelled today due to provider being out of the office - she reschedule for 11/21/24 but her job is requiring short term disability paperwork to be filled out and she's already been out of work for 2 weeks due to mom's passing. She also needs her ALPRAZolam  (XANAX ) 0.5 MG tablet refilled due to extreme anxiety and her not being able to sleep at night. She wants to know if the medication will be filled today?

## 2024-11-21 DIAGNOSIS — L639 Alopecia areata, unspecified: Secondary | ICD-10-CM | POA: Insufficient documentation

## 2024-11-21 NOTE — Telephone Encounter (Signed)
 Patient will need to be seen in order for medications to be prescribed.  That is not a medication I have prescribed for her ever in the past, and it is a controlled substance therefore reviewed would not be legal for me to send this prescription in for her.  We will try to get her short-term disability paperwork completed as quick as possible after we see her Wednesday.  I have not received any paperwork for her, therefore please ask her to request this to be sent to us  ahead of time, or if she has that she can drop it off in order to attempt to expedite the process.

## 2024-11-22 ENCOUNTER — Ambulatory Visit: Admitting: Endocrinology

## 2024-11-23 ENCOUNTER — Ambulatory Visit: Payer: Self-pay | Admitting: Family Medicine

## 2024-11-23 ENCOUNTER — Ambulatory Visit: Admitting: Family Medicine

## 2024-11-23 ENCOUNTER — Encounter: Payer: Self-pay | Admitting: Family Medicine

## 2024-11-23 VITALS — BP 102/68 | HR 76 | Temp 98.2°F | Wt 139.0 lb

## 2024-11-23 DIAGNOSIS — F331 Major depressive disorder, recurrent, moderate: Secondary | ICD-10-CM | POA: Diagnosis not present

## 2024-11-23 DIAGNOSIS — R4589 Other symptoms and signs involving emotional state: Secondary | ICD-10-CM | POA: Insufficient documentation

## 2024-11-23 DIAGNOSIS — F5102 Adjustment insomnia: Secondary | ICD-10-CM | POA: Insufficient documentation

## 2024-11-23 DIAGNOSIS — F4381 Prolonged grief disorder: Secondary | ICD-10-CM | POA: Diagnosis not present

## 2024-11-23 DIAGNOSIS — R10A1 Flank pain, right side: Secondary | ICD-10-CM | POA: Diagnosis not present

## 2024-11-23 LAB — COMPREHENSIVE METABOLIC PANEL WITH GFR
ALT: 12 U/L (ref 3–35)
AST: 17 U/L (ref 5–37)
Albumin: 4.3 g/dL (ref 3.5–5.2)
Alkaline Phosphatase: 38 U/L — ABNORMAL LOW (ref 39–117)
BUN: 12 mg/dL (ref 6–23)
CO2: 26 meq/L (ref 19–32)
Calcium: 8.8 mg/dL (ref 8.4–10.5)
Chloride: 105 meq/L (ref 96–112)
Creatinine, Ser: 0.66 mg/dL (ref 0.40–1.20)
GFR: 110.89 mL/min
Glucose, Bld: 126 mg/dL — ABNORMAL HIGH (ref 70–99)
Potassium: 4.3 meq/L (ref 3.5–5.1)
Sodium: 136 meq/L (ref 135–145)
Total Bilirubin: 0.3 mg/dL (ref 0.2–1.2)
Total Protein: 6.9 g/dL (ref 6.0–8.3)

## 2024-11-23 MED ORDER — QUETIAPINE FUMARATE 50 MG PO TABS: ORAL_TABLET | ORAL | 1 refills | Status: DC

## 2024-11-23 MED ORDER — ALPRAZOLAM 0.5 MG PO TABS: ORAL_TABLET | ORAL | 1 refills | Status: AC

## 2024-11-23 NOTE — Patient Instructions (Addendum)
 Return in about 4 weeks (around 12/21/2024) for grief/depression follow up.        Great to see you today.  I have refilled the medication(s) we provide.   If labs were collected or images ordered, we will inform you of  results once we have received them and reviewed. We will contact you either by echart message, or telephone call.  Please give ample time to the testing facility, and our office to run,  receive and review results. Please do not call inquiring of results, even if you can see them in your chart. We will contact you as soon as we are able. If it has been over 1 week since the test was completed, and you have not yet heard from us , then please call us .    - echart message- for normal results that have been seen by the patient already.   - telephone call: abnormal results or if patient has not viewed results in their echart.  If a referral to a specialist was entered for you, please call us  in 2 weeks if you have not heard from the specialist office to schedule.

## 2024-11-23 NOTE — Progress Notes (Signed)
 "      Brenda Rosales , 1986-08-08, 39 y.o., female MRN: [id number] Patient Care Team    Relationship Specialty Notifications Start End  Catherine Charlies LABOR, DO PCP - General Family Medicine  04/23/16    Comment: Patient request  Cathlyn JAYSON Nikki Bobie FORBES, MD Consulting Physician Obstetrics and Gynecology  04/23/16   Neysa Reggy BIRCH, MD Consulting Physician Pulmonary Disease  04/23/16   Abran Norleen SAILOR, MD Consulting Physician Gastroenterology  04/23/16   Connee Delon RIGGERS (Inactive)  Dermatology  05/30/20   [facility]    11/17/23   Thapa, Sudan, MD Consulting Physician Endocrinology  06/28/24     Chief Complaint  Patient presents with   Anxiety     Subjective: Brenda Rosales is a 39 y.o. Pt presents for an OV with complaints of worsening depression, anxiety and grief of due to the passing of her mother that started on 11/21/2024 and right flank pain over a month duration. She reports her mother passed away from pancreatic cancer and she feels like she can just not cope well right now.  She reports she is feeling guilt, and wishes she would have made her mother go to the doctor last year when she was reporting not feeling well.  Patient states she is not sleeping well at all.  She did try Xanax  to attempt to fall asleep and it does help her fall asleep but is not keeping her sleep.  She is sleeping through the day and not motivated.  She is very upset and just cannot imagine going back to work right now.  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  Right flank pain: Patient reports if she does not drink 80 ounces of water a day, or but if I do not drink wake up drink half of my 40 ounce of water by 8:00, she experiences a a sharp shooting pain coming down her back, but sometimes it is in the front and the stomach.  She reports she is worried she may have a kidney  stones, but does not really think it is a kidney stone.  She also has concerns she has cancer, not because she truly think she has cancer, but because she thinks she has concerns since her mother died of pancreatic cancer.  She is adopted and knows there is no biological connection, but she still is perseverating about cancer.  Her mother's sister also died of colon cancer recently.   She denies fevers, chills, dysuria or urinary frequency.  She is a diabetic.     11/23/2024    9:15 AM 11/04/2024    9:21 AM 11/17/2023    8:01 AM 11/12/2022    1:57 PM 10/22/2022    3:13 PM  Depression screen PHQ 2/9  Decreased Interest 3 3 0 0 0  Down, Depressed, Hopeless 2 1 0 0 0  PHQ - 2 Score 5 4 0 0 0  Altered sleeping 3 3     Tired, decreased energy 3 2     Change in appetite 3 1     Feeling bad or failure about yourself  0 1     Trouble concentrating 2 0     Moving slowly or fidgety/restless 0 0     Suicidal thoughts 0 0     PHQ-9 Score 16 11     Difficult doing work/chores Extremely dIfficult  Somewhat difficult         11/23/2024    9:15 AM 11/04/2024    9:19 AM 11/12/2022    1:57 PM  GAD 7 : Generalized Anxiety Score  Nervous, Anxious, on Edge 3 1 1   Control/stop worrying 3 3 3   Worry too much - different things 3 3 0  Trouble relaxing 3 3 1   Restless 2 3 3   Easily annoyed or irritable 3 3 1   Afraid - awful might happen 0 3 1  Total GAD 7 Score 17 19 10   Anxiety Difficulty Extremely difficult Very difficult       Allergies[1] Social History   Social History Narrative   Adopted from [facility].    College Occupational Psychologist in [employer]   Single. No children.   Drinks caffeine, uses herbal remedies   Wears her seatbelt, wears bicycle helmet   Reports routine exercise    Smoke detector in the home.   Feels safe in relationships.     Past Medical History:  Diagnosis Date   Allergy    Anxiety    ASTHMA 11/19/2007   Asthma    CHICKENPOX 11/19/2007   Diabetes  mellitus without complication (HCC)    diet controlled   Duodenitis    Dysmenorrhea 11/19/2007   DYSPHAGIA 11/19/2010   ECZEMA 11/19/2007   Esophageal stricture    Esophagitis    FATTY LIVER DISEASE 04/17/2008   GERD 11/20/2010   Hyperlipidemia    IBS (irritable bowel syndrome)    Infection of eyelid 08/07/2011   Low vitamin D  level 2018   Other and unspecified noninfectious gastroenteritis and colitis(558.9) 02/2014   RHINOSINUSITIS, CHRONIC 12/09/2007   TB SKIN TEST, POSITIVE 11/19/2007   Past Surgical History:  Procedure Laterality Date   CT SINUS LTD W/O CM  09/23/2007   Mole excision     x's 4   Family History  Adopted: Yes   Allergies as of 11/23/2024       Reactions   Vancomycin Other (See Comments)   Red man syndrome, now more commonly referred to as Vancomycin infusion reaction (VIR)   Januvia  [sitagliptin ] Other (See Comments)   Dizziness        Medication List        Accurate as of November 23, 2024  4:49 PM. If you have any questions, ask your nurse or doctor.          albuterol  108 (90 Base) MCG/ACT inhaler Commonly known as: VENTOLIN  HFA Inhale 2 puffs into the lungs every 6 (six) hours as needed for wheezing or shortness of breath. Refill upon request only. Can be filled with any albuterol  on formulary.   ALPRAZolam  0.5 MG tablet Commonly known as: XANAX  1/2-1 tab po bid prn severe anxiety   atorvastatin  10 MG tablet Commonly known as: LIPITOR TAKE 1 TABLET BY MOUTH EVERY DAY   clobetasol 0.05 % external solution Commonly known as: TEMOVATE Apply 1 Application topically See admin instructions. Apply to affected areas of the scalp twice a day as directed   Contour Next Monitor w/Device Kit Use to  check blood sugar in the morning fasting and at bedtime at least 3 times a week.   Contour Next Test test strip Generic drug: glucose blood check blood sugar in the morning fasting and at bedtime at least 3 times a week.   EPINEPHrine  0.3  mg/0.3 mL Soaj injection Commonly known as: EPI-PEN Inject 0.3 mg into the muscle as needed for anaphylaxis.  fenofibrate  145 MG tablet Commonly known as: TRICOR  Take 1 tablet (145 mg total) by mouth daily.   fluticasone  50 MCG/ACT nasal spray Commonly known as: FLONASE  Place 1-2 sprays into both nostrils daily.   levocetirizine 5 MG tablet Commonly known as: XYZAL  TAKE 1 TABLET BY MOUTH EVERY DAY IN THE EVENING   montelukast 10 MG tablet Commonly known as: SINGULAIR Take 10 mg by mouth at bedtime.   multivitamin with minerals Tabs tablet Take 1 tablet by mouth daily.   pantoprazole  20 MG tablet Commonly known as: Protonix  Take 1 tablet (20 mg total) by mouth daily.   prochlorperazine  5 MG tablet Commonly known as: COMPAZINE  Take 1 tablet (5 mg total) by mouth every 6 (six) hours as needed for nausea, vomiting or refractory nausea / vomiting.   QUEtiapine  50 MG tablet Commonly known as: SEROquel  1/2 tab in the morning and 1-2 tabs before bed Started by: Charlies Bellini, DO   Symbicort  160-4.5 MCG/ACT inhaler Generic drug: budesonide -formoterol  Inhale 2 puffs into the lungs 2 (two) times daily.   Synjardy  XR 12.03-999 MG Tb24 Generic drug: Empagliflozin -metFORMIN  HCl ER Take 1 tablet by mouth in the morning and at bedtime.   Vitamin D  (Ergocalciferol ) 1.25 MG (50000 UNIT) Caps capsule Commonly known as: DRISDOL  Take 1 capsule (50,000 Units total) by mouth every 7 (seven) days.        All past medical history, surgical history, allergies, family history, immunizations andmedications were updated in the EMR today and reviewed under the history and medication portions of their EMR.     ROS Negative, with the exception of above mentioned in HPI   Objective:  BP 102/68   Pulse 76   Temp 98.2 F (36.8 C)   Wt 139 lb (63 kg)   LMP 11/07/2024   SpO2 96%   BMI 24.24 kg/m  Body mass index is 24.24 kg/m.  Physical Exam Vitals and nursing note reviewed.   Constitutional:      General: She is not in acute distress.    Appearance: Normal appearance. She is normal weight. She is not ill-appearing or toxic-appearing.  HENT:     Head: Normocephalic and atraumatic.  Eyes:     General: No scleral icterus.       Right eye: No discharge.        Left eye: No discharge.     Extraocular Movements: Extraocular movements intact.     Conjunctiva/sclera: Conjunctivae normal.     Pupils: Pupils are equal, round, and reactive to light.  Abdominal:     General: Abdomen is flat.     Palpations: Abdomen is soft.     Tenderness: There is no right CVA tenderness, left CVA tenderness, guarding or rebound.  Skin:    Findings: No rash.  Neurological:     Mental Status: She is alert and oriented to person, place, and time. Mental status is at baseline.     Motor: No weakness.     Coordination: Coordination normal.     Gait: Gait normal.  Psychiatric:        Attention and Perception: Attention and perception normal.        Mood and Affect: Mood is anxious and depressed. Affect is tearful.        Speech: Speech normal.        Behavior: Behavior normal. Behavior is cooperative.        Thought Content: Thought content normal. Thought content is not paranoid or delusional. Thought content does not include  homicidal or suicidal ideation. Thought content does not include homicidal or suicidal plan.        Cognition and Memory: Cognition and memory normal.        Judgment: Judgment normal.    Diabetic Foot Exam - Simple   Simple Foot Form Diabetic Foot exam was performed with the following findings: Yes 11/23/2024  9:21 AM  Visual Inspection No deformities, no ulcerations, no other skin breakdown bilaterally: Yes Sensation Testing Intact to touch and monofilament testing bilaterally: Yes Pulse Check Posterior Tibialis and Dorsalis pulse intact bilaterally: Yes Comments      No results found. No results found. Results for orders placed or performed in  visit on 11/23/24 (from the past 24 hours)  Comp Met (CMET)     Status: Abnormal   Collection Time: 11/23/24  9:46 AM  Result Value Ref Range   Sodium 136 135 - 145 mEq/L   Potassium 4.3 3.5 - 5.1 mEq/L   Chloride 105 96 - 112 mEq/L   CO2 26 19 - 32 mEq/L   Glucose, Bld 126 (H) 70 - 99 mg/dL   BUN 12 6 - 23 mg/dL   Creatinine, Ser 9.33 0.40 - 1.20 mg/dL   Total Bilirubin 0.3 0.2 - 1.2 mg/dL   Alkaline Phosphatase 38 (L) 39 - 117 U/L   AST 17 5 - 37 U/L   ALT 12 3 - 35 U/L   Total Protein 6.9 6.0 - 8.3 g/dL   Albumin 4.3 3.5 - 5.2 g/dL   GFR 889.10 >39.99 mL/min   Calcium  8.8 8.4 - 10.5 mg/dL    Assessment/Plan: Brenda Rosales is a 39 y.o. female present for OV for  Right flank pain Worsening flank pain intermittently over the last month when not adequately hydrated. She reports the symptoms have been there and she has been hydrating, but she does know she is perseverating about the discomfort because her mother's passing a pancreatic cancer, that she is concerned about cancer within herself. - Comp Met (CMET) - Urinalysis w microscopic + reflex cultur  Complicated grief/Major depressive disorder, recurrent episode, moderate (HCC) (Primary)/Adjustment insomnia/Anxiety about health Recommend patient be excused from work-short-term disability-11/21/2024 - 12/02/2024 for her mental health. We discussed medication options for her and elected to start Seroquel  50 mg tab, half a tab in the morning and 1-2 tabs in the evening -Xanax  0.5 mg half tab-1 tab p.o. twice daily as needed for severe anxiety #20-1 refill was prescribed today for her to use sparingly only as needed for severe anxiety.  Hopefully when she starts the Seroquel  she will not find the Xanax  use necessary. -[place]  controlled substance database was reviewed and appropriate.  Contract and UDS will be obtained next visit if Xanax  becomes a routine medication. -She is established with a therapist and has been seeing  them routinely Return in about 4 weeks (around 12/21/2024) for grief/depression follow up.  Reviewed expectations re: course of current medical issues. Discussed self-management of symptoms. Outlined signs and symptoms indicating need for more acute intervention. Patient verbalized understanding and all questions were answered. Patient received an After-Visit Summary.  I personally spent a total of 43 minutes in the care of the patient today including preparing to see the patient, getting/reviewing separately obtained history, performing a medically appropriate exam/evaluation, counseling and educating, placing orders, documenting clinical information in the EHR, communicating results, and coordinating care.   Orders Placed This Encounter  Procedures   Comp Met (CMET)   Urinalysis w microscopic + reflex  cultur   Meds ordered this encounter  Medications   ALPRAZolam  (XANAX ) 0.5 MG tablet    Sig: 1/2-1 tab po bid prn severe anxiety    Dispense:  20 tablet    Refill:  1   QUEtiapine  (SEROQUEL ) 50 MG tablet    Sig: 1/2 tab in the morning and 1-2 tabs before bed    Dispense:  75 tablet    Refill:  1   Referral Orders  No referral(s) requested today     Note is dictated utilizing voice recognition software. Although note has been proof read prior to signing, occasional typographical errors still can be missed. If any questions arise, please do not hesitate to call for verification.   electronically signed by:  Charlies Bellini, DO  [facility] [place]       [1]  Allergies Allergen Reactions   Vancomycin Other (See Comments)    Red man syndrome, now more commonly referred to as Vancomycin infusion reaction (VIR)   Januvia  [Sitagliptin ] Other (See Comments)    Dizziness   "

## 2024-11-24 LAB — URINALYSIS W MICROSCOPIC + REFLEX CULTURE
Bacteria, UA: NONE SEEN /HPF
Bilirubin Urine: NEGATIVE
Hgb urine dipstick: NEGATIVE
Hyaline Cast: NONE SEEN /LPF
Ketones, ur: NEGATIVE
Leukocyte Esterase: NEGATIVE
Nitrites, Initial: NEGATIVE
Protein, ur: NEGATIVE
RBC / HPF: NONE SEEN /HPF (ref 0–2)
Specific Gravity, Urine: 1.022 (ref 1.001–1.035)
Squamous Epithelial / HPF: NONE SEEN /HPF
WBC, UA: NONE SEEN /HPF (ref 0–5)
pH: 6 (ref 5.0–8.0)

## 2024-11-24 LAB — NO CULTURE INDICATED

## 2024-11-28 ENCOUNTER — Telehealth: Payer: Self-pay

## 2024-11-28 NOTE — Telephone Encounter (Signed)
 Type of forms received: [employer] / STD  Routed to:  Team [facility]  Paperwork received by : fax   Individual made aware of 5-7 business day turn around (Y/N): n/a  Form completed and patient made aware of charges(Y/N): n/a   Faxed to :   Form location:  [facility] inbox front office

## 2024-11-30 ENCOUNTER — Encounter: Payer: Self-pay | Admitting: Family Medicine

## 2024-12-02 DIAGNOSIS — Z0279 Encounter for issue of other medical certificate: Secondary | ICD-10-CM

## 2024-12-02 NOTE — Telephone Encounter (Signed)
LVM to discuss with pt.

## 2024-12-02 NOTE — Telephone Encounter (Signed)
 Forms completed, please call patient and inform her that we have completed her packet.  We do need to have formal name of her occupation to complete the questions.  Please ask her full name of her occupation and fill in the first line under occupation before faxing.

## 2024-12-02 NOTE — Telephone Encounter (Signed)
 Forms completed-please advise patient of completion and faxed forms.

## 2024-12-06 NOTE — Telephone Encounter (Signed)
 Forms faxed

## 2024-12-08 NOTE — Telephone Encounter (Signed)
 Called and spoke with pt. I asked pt if she knew exactly what form was missing/what was needed as all forms that were sent were completed by her provider. Pt stated she was unsure as she has not been able to get in touch with anyone from [employer]. She stated she was confused because as dicussed during her office visit with Dr. Catherine, a return date of 1/26 was decided. Pt stated her workplace was closed on 1/26 so she tried to go back to work on 1/27 and was sent home. I advised pt to try and contact her employer to see if all that was needed was the return date to be revised or if an additional form was needed. I advised pt of our fax number just in case. Pt understood and stated she would let us  know what was needed after she spoke with someone from her job/[employer].

## 2024-12-09 NOTE — Telephone Encounter (Signed)
 No further action needed at this time.

## 2024-12-09 NOTE — Telephone Encounter (Signed)
 Draft printed. Given to PCP for review & signature.

## 2024-12-09 NOTE — Telephone Encounter (Signed)
 Please draft return to work excuse with the date he is requesting.

## 2024-12-15 ENCOUNTER — Other Ambulatory Visit: Payer: Self-pay | Admitting: Family Medicine

## 2024-12-21 ENCOUNTER — Ambulatory Visit: Admitting: Family Medicine

## 2024-12-28 ENCOUNTER — Ambulatory Visit: Admitting: Endocrinology
